# Patient Record
Sex: Female | Born: 1965
Health system: Southern US, Community
[De-identification: ages and names within clinical notes are randomized; demographics above are authoritative.]

## PROBLEM LIST (undated history)

## (undated) DIAGNOSIS — N943 Premenstrual tension syndrome: Secondary | ICD-10-CM

## (undated) DIAGNOSIS — I10 Essential (primary) hypertension: Secondary | ICD-10-CM

## (undated) DIAGNOSIS — E7800 Pure hypercholesterolemia, unspecified: Secondary | ICD-10-CM

## (undated) DIAGNOSIS — F32A Depression, unspecified: Secondary | ICD-10-CM

## (undated) DIAGNOSIS — F329 Major depressive disorder, single episode, unspecified: Secondary | ICD-10-CM

## (undated) DIAGNOSIS — D249 Benign neoplasm of unspecified breast: Secondary | ICD-10-CM

## (undated) DIAGNOSIS — F419 Anxiety disorder, unspecified: Secondary | ICD-10-CM

## (undated) DIAGNOSIS — G43909 Migraine, unspecified, not intractable, without status migrainosus: Secondary | ICD-10-CM

## (undated) DIAGNOSIS — N200 Calculus of kidney: Secondary | ICD-10-CM

## (undated) DIAGNOSIS — R011 Cardiac murmur, unspecified: Secondary | ICD-10-CM

## (undated) HISTORY — PX: ADENOIDECTOMY: SUR15

## (undated) HISTORY — PX: TONSILLECTOMY: SUR1361

## (undated) HISTORY — DX: Depression, unspecified: F32.A

## (undated) HISTORY — DX: Pure hypercholesterolemia, unspecified: E78.00

## (undated) HISTORY — DX: Essential (primary) hypertension: I10

## (undated) HISTORY — DX: Calculus of kidney: N20.0

## (undated) HISTORY — DX: Benign neoplasm of unspecified breast: D24.9

## (undated) HISTORY — DX: Migraine, unspecified, not intractable, without status migrainosus: G43.909

## (undated) HISTORY — DX: Cardiac murmur, unspecified: R01.1

## (undated) HISTORY — DX: Major depressive disorder, single episode, unspecified: F32.9

## (undated) HISTORY — DX: Anxiety disorder, unspecified: F41.9

## (undated) HISTORY — DX: Premenstrual tension syndrome: N94.3

---

## 1998-02-17 ENCOUNTER — Encounter: Admission: RE | Admit: 1998-02-17 | Discharge: 1998-05-18 | Payer: Self-pay | Admitting: Gynecology

## 1998-08-17 ENCOUNTER — Ambulatory Visit (HOSPITAL_COMMUNITY): Admission: RE | Admit: 1998-08-17 | Discharge: 1998-08-17 | Payer: Self-pay | Admitting: Obstetrics and Gynecology

## 1998-08-17 ENCOUNTER — Encounter: Payer: Self-pay | Admitting: Gynecology

## 1998-09-22 ENCOUNTER — Inpatient Hospital Stay (HOSPITAL_COMMUNITY): Admission: AD | Admit: 1998-09-22 | Discharge: 1998-09-25 | Payer: Self-pay | Admitting: Gynecology

## 1998-09-22 ENCOUNTER — Inpatient Hospital Stay (HOSPITAL_COMMUNITY): Admission: AD | Admit: 1998-09-22 | Discharge: 1998-09-22 | Payer: Self-pay | Admitting: Obstetrics and Gynecology

## 1998-10-27 ENCOUNTER — Other Ambulatory Visit: Admission: RE | Admit: 1998-10-27 | Discharge: 1998-10-27 | Payer: Self-pay | Admitting: Obstetrics and Gynecology

## 1998-12-14 ENCOUNTER — Ambulatory Visit (HOSPITAL_COMMUNITY): Admission: RE | Admit: 1998-12-14 | Discharge: 1998-12-14 | Payer: Self-pay | Admitting: Obstetrics and Gynecology

## 1998-12-14 ENCOUNTER — Encounter: Payer: Self-pay | Admitting: Obstetrics and Gynecology

## 1999-03-14 ENCOUNTER — Encounter: Payer: Self-pay | Admitting: Obstetrics and Gynecology

## 1999-03-14 ENCOUNTER — Ambulatory Visit (HOSPITAL_COMMUNITY): Admission: RE | Admit: 1999-03-14 | Discharge: 1999-03-14 | Payer: Self-pay | Admitting: Obstetrics and Gynecology

## 1999-11-01 ENCOUNTER — Other Ambulatory Visit: Admission: RE | Admit: 1999-11-01 | Discharge: 1999-11-01 | Payer: Self-pay | Admitting: Gynecology

## 1999-12-13 ENCOUNTER — Encounter: Admission: RE | Admit: 1999-12-13 | Discharge: 1999-12-13 | Payer: Self-pay | Admitting: Gynecology

## 1999-12-13 ENCOUNTER — Encounter: Payer: Self-pay | Admitting: Gynecology

## 2000-06-07 ENCOUNTER — Other Ambulatory Visit: Admission: RE | Admit: 2000-06-07 | Discharge: 2000-06-07 | Payer: Self-pay | Admitting: Gynecology

## 2000-11-01 ENCOUNTER — Other Ambulatory Visit: Admission: RE | Admit: 2000-11-01 | Discharge: 2000-11-01 | Payer: Self-pay | Admitting: Gynecology

## 2001-11-05 ENCOUNTER — Other Ambulatory Visit: Admission: RE | Admit: 2001-11-05 | Discharge: 2001-11-05 | Payer: Self-pay | Admitting: Gynecology

## 2002-11-06 ENCOUNTER — Other Ambulatory Visit: Admission: RE | Admit: 2002-11-06 | Discharge: 2002-11-06 | Payer: Self-pay | Admitting: Gynecology

## 2004-02-18 ENCOUNTER — Other Ambulatory Visit: Admission: RE | Admit: 2004-02-18 | Discharge: 2004-02-18 | Payer: Self-pay | Admitting: Gynecology

## 2004-03-24 ENCOUNTER — Encounter: Admission: RE | Admit: 2004-03-24 | Discharge: 2004-03-24 | Payer: Self-pay | Admitting: Obstetrics and Gynecology

## 2005-02-20 ENCOUNTER — Other Ambulatory Visit: Admission: RE | Admit: 2005-02-20 | Discharge: 2005-02-20 | Payer: Self-pay | Admitting: Obstetrics and Gynecology

## 2006-02-28 ENCOUNTER — Other Ambulatory Visit: Admission: RE | Admit: 2006-02-28 | Discharge: 2006-02-28 | Payer: Self-pay | Admitting: Gynecology

## 2007-03-13 ENCOUNTER — Other Ambulatory Visit: Admission: RE | Admit: 2007-03-13 | Discharge: 2007-03-13 | Payer: Self-pay | Admitting: Obstetrics and Gynecology

## 2008-02-25 ENCOUNTER — Encounter: Admission: RE | Admit: 2008-02-25 | Discharge: 2008-02-25 | Payer: Self-pay | Admitting: Obstetrics and Gynecology

## 2008-02-28 ENCOUNTER — Encounter: Admission: RE | Admit: 2008-02-28 | Discharge: 2008-02-28 | Payer: Self-pay | Admitting: Obstetrics and Gynecology

## 2008-03-13 ENCOUNTER — Other Ambulatory Visit: Admission: RE | Admit: 2008-03-13 | Discharge: 2008-03-13 | Payer: Self-pay | Admitting: Obstetrics and Gynecology

## 2009-03-17 ENCOUNTER — Encounter: Payer: Self-pay | Admitting: Women's Health

## 2009-03-17 ENCOUNTER — Ambulatory Visit: Payer: Self-pay | Admitting: Women's Health

## 2009-03-17 ENCOUNTER — Other Ambulatory Visit: Admission: RE | Admit: 2009-03-17 | Discharge: 2009-03-17 | Payer: Self-pay | Admitting: Obstetrics and Gynecology

## 2009-04-06 ENCOUNTER — Encounter: Admission: RE | Admit: 2009-04-06 | Discharge: 2009-04-06 | Payer: Self-pay | Admitting: Obstetrics and Gynecology

## 2010-03-18 ENCOUNTER — Ambulatory Visit: Payer: Self-pay | Admitting: Women's Health

## 2010-03-18 ENCOUNTER — Other Ambulatory Visit: Admission: RE | Admit: 2010-03-18 | Discharge: 2010-03-18 | Payer: Self-pay | Admitting: Obstetrics and Gynecology

## 2010-05-11 ENCOUNTER — Encounter: Admission: RE | Admit: 2010-05-11 | Discharge: 2010-05-11 | Payer: Self-pay | Admitting: Obstetrics and Gynecology

## 2011-02-09 ENCOUNTER — Encounter: Payer: Self-pay | Admitting: *Deleted

## 2011-03-15 ENCOUNTER — Other Ambulatory Visit: Payer: Self-pay | Admitting: *Deleted

## 2011-03-15 MED ORDER — DESOGESTREL-ETHINYL ESTRADIOL 0.15-0.02/0.01 MG (21/5) PO TABS: 1.0000 | ORAL_TABLET | Freq: Every day | ORAL | Status: DC

## 2011-03-30 ENCOUNTER — Ambulatory Visit (INDEPENDENT_AMBULATORY_CARE_PROVIDER_SITE_OTHER): Payer: PRIVATE HEALTH INSURANCE | Admitting: Women's Health

## 2011-03-30 ENCOUNTER — Encounter: Payer: Self-pay | Admitting: Women's Health

## 2011-03-30 ENCOUNTER — Other Ambulatory Visit (HOSPITAL_COMMUNITY)
Admission: RE | Admit: 2011-03-30 | Discharge: 2011-03-30 | Disposition: A | Discharge status: Home / Self Care | Payer: PRIVATE HEALTH INSURANCE | Source: Ambulatory Visit | Attending: Gynecology | Admitting: Gynecology

## 2011-03-30 DIAGNOSIS — F419 Anxiety disorder, unspecified: Secondary | ICD-10-CM | POA: Insufficient documentation

## 2011-03-30 DIAGNOSIS — F411 Generalized anxiety disorder: Secondary | ICD-10-CM

## 2011-03-30 DIAGNOSIS — IMO0001 Reserved for inherently not codable concepts without codable children: Secondary | ICD-10-CM

## 2011-03-30 DIAGNOSIS — R82998 Other abnormal findings in urine: Secondary | ICD-10-CM

## 2011-03-30 DIAGNOSIS — Z13220 Encounter for screening for lipoid disorders: Secondary | ICD-10-CM

## 2011-03-30 DIAGNOSIS — Z309 Encounter for contraceptive management, unspecified: Secondary | ICD-10-CM

## 2011-03-30 DIAGNOSIS — Z01419 Encounter for gynecological examination (general) (routine) without abnormal findings: Secondary | ICD-10-CM

## 2011-03-30 DIAGNOSIS — F32A Depression, unspecified: Secondary | ICD-10-CM | POA: Insufficient documentation

## 2011-03-30 DIAGNOSIS — F329 Major depressive disorder, single episode, unspecified: Secondary | ICD-10-CM | POA: Insufficient documentation

## 2011-03-30 MED ORDER — ESCITALOPRAM OXALATE 10 MG PO TABS: 10.0000 mg | ORAL_TABLET | Freq: Every day | ORAL | Status: DC

## 2011-03-30 MED ORDER — DESOGESTREL-ETHINYL ESTRADIOL 0.15-0.02/0.01 MG (21/5) PO TABS: 1.0000 | ORAL_TABLET | Freq: Every day | ORAL | Status: DC

## 2011-03-30 NOTE — Progress Notes (Signed)
Alexandria Weber [birth year] [id number]    History:    The patient presents for annual exam.    Past medical history, past surgical history, family history and social history were all reviewed and documented in the EPIC chart.   ROS:  A  ROS was performed and pertinent positives and negatives are included in the history.  Exam:  Filed Vitals:   03/30/11 0840  BP: 128/86    General appearance:  Normal Head/Neck:  Normal, without cervical or supraclavicular adenopathy. Thyroid:  Symmetrical, normal in size, without palpable masses or nodularity. Respiratory  Effort:  Normal  Auscultation:  Clear without wheezing or rhonchi Cardiovascular  Auscultation:  Regular rate, without rubs, murmurs or gallops  Edema/varicosities:  Not grossly evident Abdominal   Soft,nontender, without masses, guarding or rebound.  Liver/spleen:  No organomegaly noted  Hernia:  None appreciated  Occult test:   Skin  Inspection:  Grossly normal  Palpation:  Grossly normal Neurologic/psychiatric  Orientation:  Normal with appropriate conversation.  Mood/affect:  Normal  Genitourinary    Breasts: Examined lying and sitting.     Right: Without masses, retractions, discharge or axillary adenopathy.     Left: Without masses, retractions, discharge or axillary adenopathy.   Inguinal/mons:  Normal without inguinal adenopathy  External genitalia:  Normal  BUS/Urethra/Skene's glands:  Normal  Bladder:  Normal  Vagina:  Normal  Cervix:  Normal  Uterus:  retroverted, normal in size, shape and contour.  Midline and mobile  Adnexa/parametria:     Rt: Without masses or tenderness.   Lt: Without masses or tenderness.  Anus and perineum: Normal  Digital rectal exam: Normal sphincter tone without palpated masses or tenderness  Assessment/Plan:  45 y.o. MWF G3P3  for annual exam.   Contraceptive on Mircette with no bleeding or very light 1 day cycle. Prescription proper use was given and reviewed, reviewed  risks of blood clots and strokes. SBEs, yearly mammogram which have been normal. Encouraged increasing exercise, fiber rich foods greater than 20 g, fish oil supplement daily to decrease her triglycerides. Did review importance of weight loss for helping with that as well. CBC, lipid profile, UA, Pap today. She has had problems with anxiety in the past and is currently on Lexapro 10, doing well with that prescription proper use was given.. She has had counseling in the past declines need for that at this time. She does have a 14yo son Trinna Post with autism but she states he is doing a little better starting to read, unable to write. Oldest son is having a baby -recommended Tdap vaccine.   Harrington Challenger [facility], 9:24 AM 03/30/2011

## 2011-04-03 ENCOUNTER — Telehealth: Payer: Self-pay | Admitting: Women's Health

## 2011-04-03 DIAGNOSIS — R82998 Other abnormal findings in urine: Secondary | ICD-10-CM

## 2011-04-06 NOTE — Telephone Encounter (Signed)
Left message on patient's cell phone to call Sherri for further instructions.

## 2011-04-07 ENCOUNTER — Other Ambulatory Visit: Payer: Self-pay | Admitting: Women's Health

## 2011-04-07 NOTE — Telephone Encounter (Signed)
SENT TO PHARMACY AGAIN DUE TO ESCRIBE REQUEST. NANCY HAD ALREADY AUTHORIZED THIS RX ON 03/30/11 FOR 1 YEAR, BUT SENT IT AGAIN.

## 2011-04-07 NOTE — Telephone Encounter (Signed)
PT. NOTIFIED OF Alexandria Weber'S NOTE BELOW. PT. STATES SHE DOES NOT HAVE ANY URINARY SYMPTOMS. ORDER IN P.C. FOR CLEAN CATCH URINALYSIS.

## 2011-04-07 NOTE — Telephone Encounter (Signed)
Addended by: Venora Maples on: 04/07/2011 02:44 PM   Modules accepted: Orders

## 2011-08-08 DIAGNOSIS — I447 Left bundle-branch block, unspecified: Secondary | ICD-10-CM

## 2011-08-08 HISTORY — DX: Left bundle-branch block, unspecified: I44.7

## 2011-12-22 ENCOUNTER — Other Ambulatory Visit: Payer: Self-pay | Admitting: Obstetrics and Gynecology

## 2011-12-22 DIAGNOSIS — Z1231 Encounter for screening mammogram for malignant neoplasm of breast: Secondary | ICD-10-CM

## 2012-01-15 ENCOUNTER — Ambulatory Visit
Admission: RE | Admit: 2012-01-15 | Discharge: 2012-01-15 | Disposition: A | Discharge status: Home / Self Care | Payer: 59 | Source: Ambulatory Visit | Attending: Obstetrics and Gynecology | Admitting: Obstetrics and Gynecology

## 2012-01-15 DIAGNOSIS — Z1231 Encounter for screening mammogram for malignant neoplasm of breast: Secondary | ICD-10-CM

## 2012-03-01 DIAGNOSIS — I447 Left bundle-branch block, unspecified: Secondary | ICD-10-CM | POA: Insufficient documentation

## 2012-04-04 ENCOUNTER — Ambulatory Visit (INDEPENDENT_AMBULATORY_CARE_PROVIDER_SITE_OTHER): Payer: Managed Care, Other (non HMO) | Admitting: Women's Health

## 2012-04-04 ENCOUNTER — Encounter: Payer: Self-pay | Admitting: Women's Health

## 2012-04-04 VITALS — BP 126/88 | Ht 65.5 in | Wt 158.0 lb

## 2012-04-04 DIAGNOSIS — N926 Irregular menstruation, unspecified: Secondary | ICD-10-CM

## 2012-04-04 DIAGNOSIS — I10 Essential (primary) hypertension: Secondary | ICD-10-CM | POA: Insufficient documentation

## 2012-04-04 DIAGNOSIS — Z01419 Encounter for gynecological examination (general) (routine) without abnormal findings: Secondary | ICD-10-CM

## 2012-04-04 LAB — TSH: TSH: 1.614 u[IU]/mL (ref 0.350–4.500)

## 2012-04-04 NOTE — Progress Notes (Signed)
Alexandria Weber Oct 22, 1965 [id number]    History:    The patient presents for annual exam.  Stopped Mircette last month due to ER visit with chest pain diagnosed with hypertension and mild stenosis of mitral and aortic valve. Has been started on 2 antihypertensive medications and cholesterol medication. Is being seen by a cardiologist at [facility] and medications are being handled by primary care in [place]. Father has history of hypertension and hypercholesterolemia. History of normal Paps and mammograms. History of one day light cycles on Mircette and increased hot flushes in the past year.   Past medical history, past surgical history, family history and social history were all reviewed and documented in the EPIC chart. Works at [employer], has 3 children, Christiane Ha 22 in the [employer], 44 year old son Trinna Post - autistic, 44 yo daughter Dow Adolph doing well.   ROS:  A  ROS was performed and pertinent positives and negatives are included in the history.  Exam:  Filed Vitals:   04/04/12 1552  BP: 126/88    General appearance:  Normal Head/Neck:  Normal, without cervical or supraclavicular adenopathy. Thyroid:  Symmetrical, normal in size, without palpable masses or nodularity. Respiratory  Effort:  Normal  Auscultation:  Clear without wheezing or rhonchi Cardiovascular  Auscultation:  Regular rate, without rubs, murmurs or gallops  Edema/varicosities:  Not grossly evident Abdominal  Soft,nontender, without masses, guarding or rebound.  Liver/spleen:  No organomegaly noted  Hernia:  None appreciated  Skin  Inspection:  Grossly normal  Palpation:  Grossly normal Neurologic/psychiatric  Orientation:  Normal with appropriate conversation.  Mood/affect:  Normal  Genitourinary    Breasts: Examined lying and sitting.     Right: Without masses, retractions, discharge or axillary adenopathy.     Left: Without masses, retractions, discharge or axillary adenopathy.   Inguinal/mons:   Normal without inguinal adenopathy  External genitalia:  Normal  BUS/Urethra/Skene's glands:  Normal  Bladder:  Normal  Vagina:  Normal  Cervix:  Normal  Uterus:   normal in size, shape and contour.  Midline and mobile  Adnexa/parametria:     Rt: Without masses or tenderness.   Lt: Without masses or tenderness.  Anus and perineum: Normal  Digital rectal exam: Normal sphincter tone without palpated masses or tenderness  Assessment/Plan:  46 y.o. M. WF G3 P3  for annual exam recently diagnosed with hypertension, mitral and aortic stenosis with hypercholesterolemia.  Contraception management Newly diagnosed hypertension, mitral, aortic stenosis and hypercholesterolemia  Plan: Contraception reviewed, reviewed best not  to use birth control pills, Mirena IUD information given and reviewed. Reviewed to call office to have placed with a period with Dr Eda Paschal. Reviewed slight risks of infection, perforation hemorrhage. History of very light periods on birth control pills in the past. Instructed to call if no menstrual cycle. Will check a TSH and FSHprolactin. No Pap history of normal Paps new guidelines reviewed. Condoms if become sexually active. SBE's, continue annual mammogram, gradually increase exercise, calcium rich diet, vitamin D 1000 daily encouraged. Discussed having a cardiologist manage hypertension, meds and followup tests, will check in network cardiologists and schedule.   Harrington Challenger [facility], 4:46 PM 04/04/2012

## 2012-04-04 NOTE — Patient Instructions (Addendum)

## 2012-04-05 LAB — FOLLICLE STIMULATING HORMONE: FSH: 58.6 m[IU]/mL

## 2012-04-08 ENCOUNTER — Other Ambulatory Visit: Payer: Self-pay | Admitting: Women's Health

## 2014-03-12 ENCOUNTER — Other Ambulatory Visit: Payer: Self-pay

## 2014-03-12 DIAGNOSIS — Z1231 Encounter for screening mammogram for malignant neoplasm of breast: Secondary | ICD-10-CM

## 2014-03-20 ENCOUNTER — Encounter: Payer: Self-pay | Admitting: Women's Health

## 2014-03-20 ENCOUNTER — Ambulatory Visit
Admission: RE | Admit: 2014-03-20 | Discharge: 2014-03-20 | Disposition: A | Discharge status: Home / Self Care | Payer: 59 | Source: Ambulatory Visit

## 2014-03-20 ENCOUNTER — Ambulatory Visit (INDEPENDENT_AMBULATORY_CARE_PROVIDER_SITE_OTHER): Payer: 59 | Admitting: Women's Health

## 2014-03-20 ENCOUNTER — Other Ambulatory Visit (HOSPITAL_COMMUNITY)
Admission: RE | Admit: 2014-03-20 | Discharge: 2014-03-20 | Disposition: A | Discharge status: Home / Self Care | Payer: 59 | Source: Ambulatory Visit | Attending: Gynecology | Admitting: Gynecology

## 2014-03-20 VITALS — BP 112/72 | Ht 65.0 in | Wt 156.0 lb

## 2014-03-20 DIAGNOSIS — Z01419 Encounter for gynecological examination (general) (routine) without abnormal findings: Secondary | ICD-10-CM | POA: Diagnosis present

## 2014-03-20 DIAGNOSIS — Z1231 Encounter for screening mammogram for malignant neoplasm of breast: Secondary | ICD-10-CM

## 2014-03-20 DIAGNOSIS — I050 Rheumatic mitral stenosis: Secondary | ICD-10-CM

## 2014-03-20 HISTORY — DX: Rheumatic mitral stenosis: I05.0

## 2014-03-20 NOTE — Patient Instructions (Signed)

## 2014-03-20 NOTE — Progress Notes (Signed)
Alexandria Weber 46-Mar-1967 [id number]    History:    Presents for annual exam.  Monthly cycles/condoms. Normal Pap and mammograms. Hypertension/anxiety/depression and stenosis of mitral valve, has seen a cardiologist.   Past medical history, past surgical history, family history and social history were all reviewed and documented in the EPIC chart. Desk job. Children 23, Alexandria Weber 16 autistic, and Alexandria Weber 14. Father hypertension/hypercholesterolemia. Mother hypertension and stroke.  ROS:  A  12 point ROS was performed and pertinent positives and negatives are included.  Exam:  Filed Vitals:   03/20/14 0814  BP: 112/72    General appearance:  Normal Thyroid:  Symmetrical, normal in size, without palpable masses or nodularity. Respiratory  Auscultation:  Clear without wheezing or rhonchi Cardiovascular  Auscultation:  Regular rate, without rubs, murmurs or gallops  Edema/varicosities:  Not grossly evident Abdominal  Soft,nontender, without masses, guarding or rebound.  Liver/spleen:  No organomegaly noted  Hernia:  None appreciated  Skin  Inspection:  Grossly normal   Breasts: Examined lying and sitting.     Right: Without masses, retractions, discharge or axillary adenopathy.     Left: Without masses, retractions, discharge or axillary adenopathy. Gentitourinary   Inguinal/mons:  Normal without inguinal adenopathy  External genitalia:  Normal  BUS/Urethra/Skene's glands:  Normal  Vagina:  Normal  Cervix:  Normal  Uterus:   normal in size, shape and contour.  Midline and mobile  Adnexa/parametria:     Rt: Without masses or tenderness.   Lt: Without masses or tenderness.  Anus and perineum: Normal  Digital rectal exam: Normal sphincter tone without palpated masses or tenderness  Assessment/Plan:  48 y.o. MWF G3P3 for annual exam.    Normal GYN exam/condoms Hypertension/anxiety/depression - primary care managing labs and meds Stenosis of mitral valve- cardiologist at  [place]: SBE's, overdue for mammogram, has scheduled instructed to keep scheduled appointment. Increase regular exercise, calcium rich diet, vitamin D 1000 daily encouraged. UA, Pap, Pap normal 2012, new screening guidelines reviewed.  Note: This dictation was prepared with Dragon/digital dictation.  Any transcriptional errors that result are unintentional. Alexandria Weber [facility], 9:35 AM 03/20/2014

## 2014-03-21 LAB — URINALYSIS W MICROSCOPIC + REFLEX CULTURE
Casts: NONE SEEN
Crystals: NONE SEEN
Glucose, UA: NEGATIVE mg/dL
Ketones, ur: NEGATIVE mg/dL
Leukocytes, UA: NEGATIVE
Nitrite: NEGATIVE
Protein, ur: 30 mg/dL — AB
Specific Gravity, Urine: 1.023 (ref 1.005–1.030)
Urobilinogen, UA: 1 mg/dL (ref 0.0–1.0)
pH: 6.5 (ref 5.0–8.0)

## 2014-03-22 LAB — URINE CULTURE: Colony Count: >=100000

## 2014-03-23 LAB — CYTOLOGY - PAP

## 2014-06-08 ENCOUNTER — Encounter: Payer: Self-pay | Admitting: Women's Health

## 2015-03-18 ENCOUNTER — Other Ambulatory Visit: Payer: Self-pay

## 2015-03-18 DIAGNOSIS — Z1231 Encounter for screening mammogram for malignant neoplasm of breast: Secondary | ICD-10-CM

## 2015-03-25 ENCOUNTER — Ambulatory Visit
Admission: RE | Admit: 2015-03-25 | Discharge: 2015-03-25 | Disposition: A | Discharge status: Home / Self Care | Payer: 59 | Source: Ambulatory Visit

## 2015-03-25 DIAGNOSIS — Z1231 Encounter for screening mammogram for malignant neoplasm of breast: Secondary | ICD-10-CM

## 2015-04-01 ENCOUNTER — Encounter: Payer: Self-pay | Admitting: Women's Health

## 2015-07-27 ENCOUNTER — Encounter: Payer: Self-pay | Admitting: Family Medicine

## 2015-07-27 ENCOUNTER — Ambulatory Visit (INDEPENDENT_AMBULATORY_CARE_PROVIDER_SITE_OTHER): Payer: 59 | Admitting: Family Medicine

## 2015-07-27 VITALS — BP 109/75 | HR 60 | Temp 97.7°F | Resp 20 | Ht 65.0 in | Wt 154.0 lb

## 2015-07-27 DIAGNOSIS — Z0000 Encounter for general adult medical examination without abnormal findings: Secondary | ICD-10-CM | POA: Diagnosis not present

## 2015-07-27 DIAGNOSIS — F419 Anxiety disorder, unspecified: Secondary | ICD-10-CM | POA: Diagnosis not present

## 2015-07-27 DIAGNOSIS — Z23 Encounter for immunization: Secondary | ICD-10-CM

## 2015-07-27 DIAGNOSIS — M436 Torticollis: Secondary | ICD-10-CM

## 2015-07-27 DIAGNOSIS — I10 Essential (primary) hypertension: Secondary | ICD-10-CM

## 2015-07-27 DIAGNOSIS — Z7189 Other specified counseling: Secondary | ICD-10-CM

## 2015-07-27 DIAGNOSIS — Z7689 Persons encountering health services in other specified circumstances: Secondary | ICD-10-CM | POA: Insufficient documentation

## 2015-07-27 HISTORY — DX: Torticollis: M43.6

## 2015-07-27 LAB — CBC WITH DIFFERENTIAL/PLATELET
Basophils Absolute: 0 10*3/uL (ref 0.0–0.1)
Basophils Relative: 0.7 % (ref 0.0–3.0)
Eosinophils Absolute: 0.3 10*3/uL (ref 0.0–0.7)
Eosinophils Relative: 6.8 % — ABNORMAL HIGH (ref 0.0–5.0)
HCT: 37.7 % (ref 36.0–46.0)
Hemoglobin: 12.2 g/dL (ref 12.0–15.0)
Lymphocytes Relative: 39.2 % (ref 12.0–46.0)
Lymphs Abs: 1.7 10*3/uL (ref 0.7–4.0)
MCHC: 32.4 g/dL (ref 30.0–36.0)
MCV: 86.2 fl (ref 78.0–100.0)
Monocytes Absolute: 0.3 10*3/uL (ref 0.1–1.0)
Monocytes Relative: 6 % (ref 3.0–12.0)
Neutro Abs: 2 10*3/uL (ref 1.4–7.7)
Neutrophils Relative %: 47.3 % (ref 43.0–77.0)
Platelets: 389 10*3/uL (ref 150.0–400.0)
RBC: 4.38 Mil/uL (ref 3.87–5.11)
RDW: 14.6 % (ref 11.5–15.5)
WBC: 4.3 10*3/uL (ref 4.0–10.5)

## 2015-07-27 LAB — COMPREHENSIVE METABOLIC PANEL
ALT: 14 U/L (ref 0–35)
AST: 21 U/L (ref 0–37)
Albumin: 4.4 g/dL (ref 3.5–5.2)
Alkaline Phosphatase: 95 U/L (ref 39–117)
BUN: 14 mg/dL (ref 6–23)
CO2: 32 mEq/L (ref 19–32)
Calcium: 8.9 mg/dL (ref 8.4–10.5)
Chloride: 103 mEq/L (ref 96–112)
Creatinine, Ser: 0.67 mg/dL (ref 0.40–1.20)
GFR: 100.07 mL/min (ref >60.00)
Glucose, Bld: 87 mg/dL (ref 70–99)
Potassium: 4.2 mEq/L (ref 3.5–5.1)
Sodium: 141 mEq/L (ref 135–145)
Total Bilirubin: 0.5 mg/dL (ref 0.2–1.2)
Total Protein: 7.1 g/dL (ref 6.0–8.3)

## 2015-07-27 LAB — LIPID PANEL
Cholesterol: 215 mg/dL — ABNORMAL HIGH (ref 0–200)
HDL: 64.7 mg/dL (ref >39.00)
LDL Cholesterol: 132 mg/dL — ABNORMAL HIGH (ref 0–99)
NonHDL: 150.27
Total CHOL/HDL Ratio: 3
Triglycerides: 91 mg/dL (ref 0.0–149.0)
VLDL: 18.2 mg/dL (ref 0.0–40.0)

## 2015-07-27 LAB — TSH: TSH: 1.29 u[IU]/mL (ref 0.35–4.50)

## 2015-07-27 LAB — HEMOGLOBIN A1C: Hgb A1c MFr Bld: 5.6 % (ref 4.6–6.5)

## 2015-07-27 MED ORDER — ESCITALOPRAM OXALATE 20 MG PO TABS: 20.0000 mg | ORAL_TABLET | Freq: Every day | ORAL | Status: DC

## 2015-07-27 MED ORDER — AMLODIPINE BESYLATE 5 MG PO TABS: 5.0000 mg | ORAL_TABLET | Freq: Every day | ORAL | Status: DC

## 2015-07-27 NOTE — Progress Notes (Signed)
Subjective:    Patient ID: Alexandria Weber, female    DOB: [birth year], 49 y.o.   MRN: [id number]  HPI  Patient presents for new patient establishment. All past medical history, surgical history, allergies, family history, immunizations and social history was obtained from the patient today and entered into the electronic medical record. Records are requested from her prior PCP, and will be reviewed at the time they are received. All medical records will be updated at that time.  Hypertension: Patient states she has been on blood pressure medications for approximate 4 years. She reports she was diagnosed after hypertensive crisis. She was started on a medication, that she does not recall, and it gave her headaches. At that time she switched to amlodipine, and has tolerated it well. Currently she's on amlodipine 5 mg daily, without side effects and well-controlled blood pressures. Patient does not watch the salt content in her diet or exercise daily. She denies any chest pain, shortness of breath or lower extremity edema. She does admit to occasional dizziness, that she does not feel is associated with her blood pressure. She does monitor her blood pressure at home and she states it's normally around 120/70.   Anxiety: Patient states she has been on Lexapro for approximately 4 years, last year her dose was increased from 2 mg to 20 mg Lexapro daily. Patient takes this medication in the morning. She reports good compliance with the medication. She denies any negative side effects with medication. She feels her condition is stable on this dose of medication. She was tried on Zoloft many years ago, that was discontinued not necessarily to side effects to her condition improved.   Neck/shoulder pain: Patient states that her prior PCP and chiropractor or treating her neck/shoulder pain. She was diagnosed with left torticollis, and treated through chiropractic manipulation and muscle relaxers. She has never  seen physical therapy. She does not recall the muscle relaxers that were tried, but states that she never felt that they were effective. At one time she did have her referral to discuss Botox injections, but states that she did not follow-up with that referral secondary to fear. She has not seen her provider or her chiropractor for the last year. She feels that her condition is progressing.  Health maintenance:  Colonoscopy:No fhx colon cancer/polyps, routine screen age 64 Mammogram: UTD 2016, Birads31. No family history continue one to two-year screening. Cervical cancer screening:UTD 2015 (no co-testing), Dr. Elon Alas,  Immunizations: Flu shot given today, Tdap unknown (waiting until after the holidays)  Infectious disease screening: HIV completed with blood donation and pregnancy   Past Medical History  Diagnosis Date  . Migraine headache   . PMS (premenstrual syndrome)   . Fibroadenoma of breast     H/O IN LEFT BREAST NO CHANGE PER [place]  . Anxiety   . Depression   . Hypertension   . High cholesterol   . Heart murmur    Allergies  Allergen Reactions  . Penicillins Hives  . Sulfa Antibiotics Hives   Past Surgical History  Procedure Laterality Date  . Cesarean section      x2  . Tonsillectomy     Family History  Problem Relation Age of Onset  . Heart disease Mother   . Hearing loss Mother   . Heart disease Father   . Hypertension Father   . Stroke Father   . Arthritis Father   . Hypertension Maternal Grandfather   . Hypertension Paternal Grandfather   . Heart  disease Paternal Grandfather   . Drug abuse Brother     committed suicide  . Diabetes Mellitus II Maternal Aunt   . Diabetes Mellitus II Paternal Aunt   . Heart disease Paternal Uncle   . Colon cancer Neg Hx   . Breast cancer Neg Hx    Social History   Social History  . Marital Status: Married    Spouse Name: N/A  . Number of Children: N/A  . Years of Education: N/A   Occupational History  . Not on  file.   Social History Main Topics  . Smoking status: Never Smoker   . Smokeless tobacco: Never Used  . Alcohol Use: No  . Drug Use: No  . Sexual Activity: Yes    Birth Control/ Protection: None   Other Topics Concern  . Not on file   Social History Narrative   Married. Spouse is name Suezanne Jacquet. 3 children.   High school graduate. Full-time employed. 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   Drinks caffeinated beverages, no tobacco, no alcohol, no recreational drugs.   Wears her seatbelt. Smoke detectors located in the home. Firearms located in the home in a locked case.   Feels safe in her relationship.     Medication List       This list is accurate as of: 07/27/15 12:13 PM.  Always use your most recent med list.               amLODipine 5 MG tablet  Commonly known as:  NORVASC  Take 1 tablet (5 mg total) by mouth daily.     escitalopram 20 MG tablet  Commonly known as:  LEXAPRO  Take 1 tablet (20 mg total) by mouth daily.          Review of Systems Negative, with the exception of above mentioned in HPI      Objective:   Physical Exam BP 109/75 mmHg  Pulse 60  Temp(Src) 97.7 F (36.5 C) (Oral)  Resp 20  Ht 5' 5"  (1.651 m)  Wt 154 lb (69.854 kg)  BMI 25.63 kg/m2  SpO2 99%  LMP 06/23/2015 Gen: Afebrile. No acute distress. Nontoxic in appearance, well-developed, well-nourished, Caucasian female. HENT: AT. [place]. Bilateral TM visualized and normal in appearance. MMM, no oral lesions Bilateral nares without erythema or swelling. Throat without erythema or exudates. No cough on exam, no hoarseness on exam. Good dentition. Eyes:Pupils Equal Round Reactive to light, Extraocular movements intact,  Conjunctiva without redness, discharge or icterus. Neck/lymp/endocrine: Supple, no lymphadenopathy, no thyromegaly CV: RRR 1/6 systolic murmur appreciated, no clicks, no gallops, no rubs, no edema, +2/4 P posterior tibialis pulses Chest: CTAB, no wheeze or crackles Abd: Soft. Flat.  NTND. BS present. No Masses palpated. No rebound tenderness or guarding. No hepatosplenomegaly.  MSK: No erythema,  Skin: No rashes, purpura or petechiae.  Neuro: Normal gait. PERLA. EOMi. Alert. Oriented x3Cranial nerves II through XII intact. Muscle strength 5/5 upper and lower extremity. DTRs equal bilaterally. Psych: Normal affect, dress and demeanor. Normal speech. Normal thought content and judgment..      Assessment & Plan:  1. Need for prophylactic vaccination and inoculation against influenza - Flu Vaccine QUAD 36+ mos PF IM (Fluarix & Fluzone Quad PF)  2. Essential hypertension - Stable - Patient is on low-salt diet, exercise at least 150 minutes a week. She reports compliance with her amlodipine 5 mg, refills provided today and 90 day refills - amLODipine (NORVASC) 5 MG tablet; Take 1 tablet (5  mg total) by mouth daily.  Dispense: 90 tablet; Refill: 1 - CBC w/Diff - Comp Met (CMET) - Lipid panel - TSH - HgB A1c  3. Anxiety - Stable. Refills on Lexapro 20 mg daily provided today. - escitalopram (LEXAPRO) 20 MG tablet; Take 1 tablet (20 mg total) by mouth daily.  Dispense: 90 tablet; Refill: 1  4. Encounter for preventive health examination/establish care Colonoscopy:No fhx colon cancer/polyps, routine screen age 16 Mammogram: UTD 2016, Birads60. No family history continue one to two-year screening. Cervical cancer screening:UTD 2015 (no co-testing), Dr. Elon Alas,  Immunizations: Flu shot given today, Tdap unknown (waiting until after the holidays)  Infectious disease screening: HIV completed with blood donation and pregnancy  5. Torticollis, acquired - Acquired torticollis by patient. Records have been requested from prior PCP and chiropractors office today. Once records received, will consider plan, consider Botox injections. Will place referral without appointment if patient desires. Consider physical therapy.   6 month follow-up on anxiety

## 2015-07-27 NOTE — Patient Instructions (Addendum)
Health Maintenance, Female Adopting a healthy lifestyle and getting preventive care can go a long way to promote health and wellness. Talk with your health care provider about what schedule of regular examinations is right for you. This is a good chance for you to check in with your provider about disease prevention and staying healthy. In between checkups, there are plenty of things you can do on your own. Experts have done a lot of research about which lifestyle changes and preventive measures are most likely to keep you healthy. Ask your health care provider for more information. WEIGHT AND DIET  Eat a healthy diet  Be sure to include plenty of vegetables, fruits, low-fat dairy products, and lean protein.  Do not eat a lot of foods high in solid fats, added sugars, or salt.  Get regular exercise. This is one of the most important things you can do for your health.  Most adults should exercise for at least 150 minutes each week. The exercise should increase your heart rate and make you sweat (moderate-intensity exercise).  Most adults should also do strengthening exercises at least twice a week. This is in addition to the moderate-intensity exercise.  Maintain a healthy weight  Body mass index (BMI) is a measurement that can be used to identify possible weight problems. It estimates body fat based on height and weight. Your health care provider can help determine your BMI and help you achieve or maintain a healthy weight.  For females 20 years of age and older:   A BMI below 18.5 is considered underweight.  A BMI of 18.5 to 24.9 is normal.  A BMI of 25 to 29.9 is considered overweight.  A BMI of 30 and above is considered obese.  Watch levels of cholesterol and blood lipids  You should start having your blood tested for lipids and cholesterol at 47 years of age, then have this test every 5 years.  You may need to have your cholesterol levels checked more often if:  Your lipid  or cholesterol levels are high.  You are older than 47 years of age.  You are at high risk for heart disease.  CANCER SCREENING   Lung Cancer  Lung cancer screening is recommended for adults 55-80 years old who are at high risk for lung cancer because of a history of smoking.  A yearly low-dose CT scan of the lungs is recommended for people who:  Currently smoke.  Have quit within the past 15 years.  Have at least a 30-pack-year history of smoking. A pack year is smoking an average of one pack of cigarettes a day for 1 year.  Yearly screening should continue until it has been 15 years since you quit.  Yearly screening should stop if you develop a health problem that would prevent you from having lung cancer treatment.  Breast Cancer  Practice breast self-awareness. This means understanding how your breasts normally appear and feel.  It also means doing regular breast self-exams. Let your health care provider know about any changes, no matter how small.  If you are in your 20s or 30s, you should have a clinical breast exam (CBE) by a health care provider every 1-3 years as part of a regular health exam.  If you are 40 or older, have a CBE every year. Also consider having a breast X-ray (mammogram) every year.  If you have a family history of breast cancer, talk to your health care provider about genetic screening.  If you   are at high risk for breast cancer, talk to your health care provider about having an MRI and a mammogram every year.  Breast cancer gene (BRCA) assessment is recommended for women who have family members with BRCA-related cancers. BRCA-related cancers include:  Breast.  Ovarian.  Tubal.  Peritoneal cancers.  Results of the assessment will determine the need for genetic counseling and BRCA1 and BRCA2 testing. Cervical Cancer Your health care provider may recommend that you be screened regularly for cancer of the pelvic organs (ovaries, uterus, and  vagina). This screening involves a pelvic examination, including checking for microscopic changes to the surface of your cervix (Pap test). You may be encouraged to have this screening done every 3 years, beginning at age 21.  For women ages 30-65, health care providers may recommend pelvic exams and Pap testing every 3 years, or they may recommend the Pap and pelvic exam, combined with testing for human papilloma virus (HPV), every 5 years. Some types of HPV increase your risk of cervical cancer. Testing for HPV may also be done on women of any age with unclear Pap test results.  Other health care providers may not recommend any screening for nonpregnant women who are considered low risk for pelvic cancer and who do not have symptoms. Ask your health care provider if a screening pelvic exam is right for you.  If you have had past treatment for cervical cancer or a condition that could lead to cancer, you need Pap tests and screening for cancer for at least 20 years after your treatment. If Pap tests have been discontinued, your risk factors (such as having a new sexual partner) need to be reassessed to determine if screening should resume. Some women have medical problems that increase the chance of getting cervical cancer. In these cases, your health care provider may recommend more frequent screening and Pap tests. Colorectal Cancer  This type of cancer can be detected and often prevented.  Routine colorectal cancer screening usually begins at 47 years of age and continues through 47 years of age.  Your health care provider may recommend screening at an earlier age if you have risk factors for colon cancer.  Your health care provider may also recommend using home test kits to check for hidden blood in the stool.  A small camera at the end of a tube can be used to examine your colon directly (sigmoidoscopy or colonoscopy). This is done to check for the earliest forms of colorectal  cancer.  Routine screening usually begins at age 50.  Direct examination of the colon should be repeated every 5-10 years through 47 years of age. However, you may need to be screened more often if early forms of precancerous polyps or small growths are found. Skin Cancer  Check your skin from head to toe regularly.  Tell your health care provider about any new moles or changes in moles, especially if there is a change in a mole's shape or color.  Also tell your health care provider if you have a mole that is larger than the size of a pencil eraser.  Always use sunscreen. Apply sunscreen liberally and repeatedly throughout the day.  Protect yourself by wearing long sleeves, pants, a wide-brimmed hat, and sunglasses whenever you are outside. HEART DISEASE, DIABETES, AND HIGH BLOOD PRESSURE   High blood pressure causes heart disease and increases the risk of stroke. High blood pressure is more likely to develop in:  People who have blood pressure in the high end   of the normal range (130-139/85-89 mm Hg).  People who are overweight or obese.  People who are African American.  If you are 38-23 years of age, have your blood pressure checked every 3-5 years. If you are 61 years of age or older, have your blood pressure checked every year. You should have your blood pressure measured twice--once when you are at a hospital or clinic, and once when you are not at a hospital or clinic. Record the average of the two measurements. To check your blood pressure when you are not at a hospital or clinic, you can use:  An automated blood pressure machine at a pharmacy.  A home blood pressure monitor.  If you are between 45 years and 39 years old, ask your health care provider if you should take aspirin to prevent strokes.  Have regular diabetes screenings. This involves taking a blood sample to check your fasting blood sugar level.  If you are at a normal weight and have a low risk for diabetes,  have this test once every three years after 47 years of age.  If you are overweight and have a high risk for diabetes, consider being tested at a younger age or more often. PREVENTING INFECTION  Hepatitis B  If you have a higher risk for hepatitis B, you should be screened for this virus. You are considered at high risk for hepatitis B if:  You were born in a country where hepatitis B is common. Ask your health care provider which countries are considered high risk.  Your parents were born in a high-risk country, and you have not been immunized against hepatitis B (hepatitis B vaccine).  You have HIV or AIDS.  You use needles to inject street drugs.  You live with someone who has hepatitis B.  You have had sex with someone who has hepatitis B.  You get hemodialysis treatment.  You take certain medicines for conditions, including cancer, organ transplantation, and autoimmune conditions. Hepatitis C  Blood testing is recommended for:  Everyone born from [birth year] through 1963.  Anyone with known risk factors for hepatitis C. Sexually transmitted infections (STIs)  You should be screened for sexually transmitted infections (STIs) including gonorrhea and chlamydia if:  You are sexually active and are younger than 47 years of age.  You are older than 47 years of age and your health care provider tells you that you are at risk for this type of infection.  Your sexual activity has changed since you were last screened and you are at an increased risk for chlamydia or gonorrhea. Ask your health care provider if you are at risk.  If you do not have HIV, but are at risk, it may be recommended that you take a prescription medicine daily to prevent HIV infection. This is called pre-exposure prophylaxis (PrEP). You are considered at risk if:  You are sexually active and do not regularly use condoms or know the HIV status of your partner(s).  You take drugs by injection.  You are sexually  active with a partner who has HIV. Talk with your health care provider about whether you are at high risk of being infected with HIV. If you choose to begin PrEP, you should first be tested for HIV. You should then be tested every 3 months for as long as you are taking PrEP.  PREGNANCY   If you are premenopausal and you may become pregnant, ask your health care provider about preconception counseling.  If you may  become pregnant, take 400 to 800 micrograms (mcg) of folic acid every day.  If you want to prevent pregnancy, talk to your health care provider about birth control (contraception). OSTEOPOROSIS AND MENOPAUSE   Osteoporosis is a disease in which the bones lose minerals and strength with aging. This can result in serious bone fractures. Your risk for osteoporosis can be identified using a bone density scan.  If you are 55 years of age or older, or if you are at risk for osteoporosis and fractures, ask your health care provider if you should be screened.  Ask your health care provider whether you should take a calcium or vitamin D supplement to lower your risk for osteoporosis.  Menopause may have certain physical symptoms and risks.  Hormone replacement therapy may reduce some of these symptoms and risks. Talk to your health care provider about whether hormone replacement therapy is right for you.  HOME CARE INSTRUCTIONS   Schedule regular health, dental, and eye exams.  Stay current with your immunizations.   Do not use any tobacco products including cigarettes, chewing tobacco, or electronic cigarettes.  If you are pregnant, do not drink alcohol.  If you are breastfeeding, limit how much and how often you drink alcohol.  Limit alcohol intake to no more than 1 drink per day for nonpregnant women. One drink equals 12 ounces of beer, 5 ounces of wine, or 1 ounces of hard liquor.  Do not use street drugs.  Do not share needles.  Ask your health care provider for help if  you need support or information about quitting drugs.  Tell your health care provider if you often feel depressed.  Tell your health care provider if you have ever been abused or do not feel safe at home.   This information is not intended to replace advice given to you by your health care provider. Make sure you discuss any questions you have with your health care provider.   Document Released: 02/06/2011 Document Revised: 08/14/2014 Document Reviewed: 06/25/2013 Elsevier Interactive Patient Education [employer].  It was a pleasure meeting you today! I hope yo have a great holiday season.  F/U 6 months on hypertension and anxiety. We will call you with your results once available.  Refills prescribed for 6 months

## 2015-07-28 ENCOUNTER — Telehealth: Payer: Self-pay | Admitting: Family Medicine

## 2015-07-28 DIAGNOSIS — E785 Hyperlipidemia, unspecified: Secondary | ICD-10-CM

## 2015-07-28 NOTE — Telephone Encounter (Signed)
Spoke with patient reviewed results and instructions . Patient verbalized understanding. 

## 2015-07-28 NOTE — Telephone Encounter (Signed)
Please call pt:  - Her labs from yesterday are normal. - Her LDL (bad cholesterol) is mildly elevated at 132. Low saturated fat Diet, increase exercise and consider fish oil/krill oil supplement. Ideally, with her history of elevate BP and family history of heart disease, her LDL should be below 100. However her HDL (good cholesterol) is excellent.  - We will recheck in 6 months (fastign lab ~3 days prior to appt), if LDL not improved on above regimen, may discuss prescribed cholesterol lowering medications considering her family history.

## 2015-07-28 NOTE — Telephone Encounter (Signed)
Left message for patient to return call to review labs and instructions. 

## 2015-10-12 ENCOUNTER — Telehealth: Payer: Self-pay | Admitting: Family Medicine

## 2015-10-12 DIAGNOSIS — M436 Torticollis: Secondary | ICD-10-CM

## 2015-10-12 NOTE — Telephone Encounter (Signed)
Patient is requested referral for rehabilitation of her neck. She said Dr. Raoul Pitch recommended it during last OV.

## 2015-10-13 NOTE — Telephone Encounter (Signed)
We did discuss her physical therapy for her torticollis on the last appointment, and I did tell her if she decided she would like to start physical therapy I would refer her without appointment. Therefore I have placed a referral for her today.

## 2015-10-21 NOTE — Telephone Encounter (Signed)
Patient is scheduled at [facility]

## 2016-01-25 ENCOUNTER — Encounter: Payer: Self-pay | Admitting: Family Medicine

## 2016-01-25 ENCOUNTER — Ambulatory Visit (INDEPENDENT_AMBULATORY_CARE_PROVIDER_SITE_OTHER): Payer: 59 | Admitting: Family Medicine

## 2016-01-25 VITALS — BP 117/75 | HR 65 | Temp 98.1°F | Resp 20 | Ht 65.0 in | Wt 151.2 lb

## 2016-01-25 DIAGNOSIS — E785 Hyperlipidemia, unspecified: Secondary | ICD-10-CM | POA: Diagnosis not present

## 2016-01-25 DIAGNOSIS — Z23 Encounter for immunization: Secondary | ICD-10-CM

## 2016-01-25 DIAGNOSIS — F419 Anxiety disorder, unspecified: Secondary | ICD-10-CM

## 2016-01-25 DIAGNOSIS — I10 Essential (primary) hypertension: Secondary | ICD-10-CM

## 2016-01-25 DIAGNOSIS — M436 Torticollis: Secondary | ICD-10-CM

## 2016-01-25 LAB — LIPID PANEL
Cholesterol: 231 mg/dL — ABNORMAL HIGH (ref 0–200)
HDL: 62 mg/dL (ref >39.00)
LDL Cholesterol: 146 mg/dL — ABNORMAL HIGH (ref 0–99)
NonHDL: 169
Total CHOL/HDL Ratio: 4
Triglycerides: 116 mg/dL (ref 0.0–149.0)
VLDL: 23.2 mg/dL (ref 0.0–40.0)

## 2016-01-25 MED ORDER — ESCITALOPRAM OXALATE 20 MG PO TABS: 20.0000 mg | ORAL_TABLET | Freq: Every day | ORAL | Status: DC

## 2016-01-25 MED ORDER — AMLODIPINE BESYLATE 5 MG PO TABS: 5.0000 mg | ORAL_TABLET | Freq: Every day | ORAL | Status: DC

## 2016-01-25 NOTE — Patient Instructions (Signed)
1000 mg fish oil daily.  I refilled your medicines today.  Follow up in in mid- December for preventive.

## 2016-01-25 NOTE — Progress Notes (Signed)
Patient ID: Alexandria Weber, female   DOB: [birth year], 50 y.o.   MRN: [id number]   Subjective:    Patient ID: Alexandria Weber, female    DOB: Mar 21, 1966, 50 y.o.   MRN: [id number]  HPI  Hypertension: patient presents today for follow up on her hypertension. She is doing well with amlodipine 5 mg daily. She denies any negative side effects to medication. She is not as compliant with her diet as she should be. She doe snot routinely exercise. She denies any chest pain, shortness of breath or lower extremity edema.   Anxiety: Patient states she is doing well on Lexapro 20 mg daily. She feels stable on this medication.  Patient takes this medication in the morning. She reports good compliance with the medication. She denies any negative side effects with medication. She does need refills today.   Torticollis: has started botox injections with Dr. Nelva Bush. She is feels she has seen some positive changes with use.   Past Medical History  Diagnosis Date  . Migraine headache   . PMS (premenstrual syndrome)   . Fibroadenoma of breast     H/O IN LEFT BREAST NO CHANGE PER [place]  . Anxiety   . Depression   . Hypertension   . High cholesterol   . Heart murmur    Allergies  Allergen Reactions  . Penicillins Hives  . Sulfa Antibiotics Hives   Past Surgical History  Procedure Laterality Date  . Cesarean section      x2  . Tonsillectomy     Family History  Problem Relation Age of Onset  . Heart disease Mother   . Hearing loss Mother   . Heart disease Father   . Hypertension Father   . Stroke Father   . Arthritis Father   . Hypertension Maternal Grandfather   . Hypertension Paternal Grandfather   . Heart disease Paternal Grandfather   . Drug abuse Brother     committed suicide  . Diabetes Mellitus II Maternal Aunt   . Diabetes Mellitus II Paternal Aunt   . Heart disease Paternal Uncle   . Colon cancer Neg Hx   . Breast cancer Neg Hx    Social History   Social History  . Marital  Status: Married    Spouse Name: N/A  . Number of Children: N/A  . Years of Education: N/A   Occupational History  . Not on file.   Social History Main Topics  . Smoking status: Never Smoker   . Smokeless tobacco: Never Used  . Alcohol Use: No  . Drug Use: No  . Sexual Activity: Yes    Birth Control/ Protection: None   Other Topics Concern  . Not on file   Social History Narrative   Married. Spouse is name Alexandria Weber. 3 children.   High school graduate. Full-time employed. 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   Drinks caffeinated beverages, no tobacco, no alcohol, no recreational drugs.   Wears her seatbelt. Smoke detectors located in the home. Firearms located in the home in a locked case.   Feels safe in her relationship.     Medication List       This list is accurate as of: 01/25/16  8:13 AM.  Always use your most recent med list.               amLODipine 5 MG tablet  Commonly known as:  NORVASC  Take 1 tablet (5 mg total) by mouth daily.     escitalopram 20  MG tablet  Commonly known as:  LEXAPRO  Take 1 tablet (20 mg total) by mouth daily.         Review of Systems Negative, with the exception of above mentioned in HPI      Objective:   Physical Exam BP 117/75 mmHg  Pulse 65  Temp(Src) 98.1 F (36.7 C) (Oral)  Resp 20  Ht 5\' 5"  (1.651 m)  Wt 151 lb 4 oz (68.607 kg)  BMI 25.17 kg/m2  SpO2 95%  LMP 12/25/2015 (Approximate) Gen: Afebrile. No acute distress. Nontoxic in appearance, well-developed, well-nourished, Caucasian female. HENT: AT. [place].  MMM, no oral lesions  Eyes:Pupils Equal Round Reactive to light, Extraocular movements intact,  Conjunctiva without redness, discharge or icterus. CV: RRR 1/6 systolic murmur appreciated, no clicks, no gallops, no rubs, no edema, +2/4 P posterior tibialis pulses Chest: CTAB, no wheeze or crackles  MSK: No erythema, right upper trap hypertrophy, left upper trap with pain over supraspinatus, moderate muscle atrophy.    Neuro: Normal gait. PERLA. EOMi. Alert. Oriented x3 Psych: Normal affect, dress and demeanor. Normal speech. Normal thought content and judgment.      Assessment & Plan:  Essential hypertension - Stable - Patient is to start low-salt diet, exercise at least 150 minutes a week. She reports compliance with her amlodipine 5 mg, refills provided today and 90 day refills - amLODipine (NORVASC) 5 MG tablet; Take 1 tablet (5 mg total) by mouth daily.  Dispense: 90 tablet; Refill: 1 - Lipid panel  Hyperlipidemia:  - Prior h/o hyperlipidemia nad strong FH heart disease.  - Lipid panel today.  - encouraged fish oil 1000 mg daily.   Anxiety - Stable. Refills on Lexapro 20 mg daily provided today. - escitalopram (LEXAPRO) 20 MG tablet; Take 1 tablet (20 mg total) by mouth daily.  Dispense: 90 tablet; Refill: 1  Torticollis, acquired - responding well to botox treatment. May need OMT/trigger injections in the future.    6 month CPE with fasting labs.   Electronically Signed by: Howard Pouch, DO [facility] [place]

## 2016-01-26 ENCOUNTER — Telehealth: Payer: Self-pay | Admitting: Family Medicine

## 2016-01-26 NOTE — Telephone Encounter (Signed)
Please  Call pt: - her cholesterol levels are a little higher than prior collection. - I would recommend she work on diet and exercise, and take 2000 mg of fish oil daily (prior stated 1000 mg). We will retest at her CPE in 6 months. If still not at goal will start on medication.  >150 minutes of exercise Diet higher in fiber/fresh vegetables, low  In saturated fats

## 2016-01-26 NOTE — Telephone Encounter (Signed)
Left message for patient to call back to review results and instructions.

## 2016-01-26 NOTE — Telephone Encounter (Signed)
Spoke with patient reviewed results and instructions . Patient verbalized understanding. 

## 2016-07-24 ENCOUNTER — Other Ambulatory Visit: Payer: Self-pay | Admitting: *Deleted

## 2016-07-24 DIAGNOSIS — I10 Essential (primary) hypertension: Secondary | ICD-10-CM

## 2016-07-24 DIAGNOSIS — F419 Anxiety disorder, unspecified: Secondary | ICD-10-CM

## 2016-07-24 MED ORDER — AMLODIPINE BESYLATE 5 MG PO TABS: 5.0000 mg | ORAL_TABLET | Freq: Every day | ORAL | 0 refills | Status: DC

## 2016-07-24 NOTE — Telephone Encounter (Signed)
Refilled amlodiopine for 30 days needs appt for anymore refills.

## 2016-07-27 ENCOUNTER — Encounter: Payer: 59 | Admitting: Family Medicine

## 2016-08-21 ENCOUNTER — Other Ambulatory Visit: Payer: Self-pay | Admitting: Family Medicine

## 2016-08-21 DIAGNOSIS — Z1231 Encounter for screening mammogram for malignant neoplasm of breast: Secondary | ICD-10-CM

## 2016-08-22 ENCOUNTER — Encounter: Payer: 59 | Admitting: Family Medicine

## 2016-08-31 ENCOUNTER — Other Ambulatory Visit: Payer: Self-pay | Admitting: Family Medicine

## 2016-08-31 DIAGNOSIS — F419 Anxiety disorder, unspecified: Secondary | ICD-10-CM

## 2016-08-31 DIAGNOSIS — I10 Essential (primary) hypertension: Secondary | ICD-10-CM

## 2016-09-05 ENCOUNTER — Encounter: Payer: Self-pay | Admitting: Family Medicine

## 2016-09-05 ENCOUNTER — Ambulatory Visit (INDEPENDENT_AMBULATORY_CARE_PROVIDER_SITE_OTHER): Payer: 59 | Admitting: Family Medicine

## 2016-09-05 ENCOUNTER — Ambulatory Visit
Admission: RE | Admit: 2016-09-05 | Discharge: 2016-09-05 | Disposition: A | Discharge status: Home / Self Care | Payer: 59 | Source: Ambulatory Visit | Attending: Family Medicine | Admitting: Family Medicine

## 2016-09-05 VITALS — BP 105/70 | HR 72 | Temp 98.3°F | Resp 16 | Ht 65.0 in | Wt 152.0 lb

## 2016-09-05 DIAGNOSIS — Z23 Encounter for immunization: Secondary | ICD-10-CM

## 2016-09-05 DIAGNOSIS — E785 Hyperlipidemia, unspecified: Secondary | ICD-10-CM | POA: Diagnosis not present

## 2016-09-05 DIAGNOSIS — Z1329 Encounter for screening for other suspected endocrine disorder: Secondary | ICD-10-CM

## 2016-09-05 DIAGNOSIS — Z0000 Encounter for general adult medical examination without abnormal findings: Secondary | ICD-10-CM | POA: Diagnosis not present

## 2016-09-05 DIAGNOSIS — R7309 Other abnormal glucose: Secondary | ICD-10-CM

## 2016-09-05 DIAGNOSIS — F419 Anxiety disorder, unspecified: Secondary | ICD-10-CM

## 2016-09-05 DIAGNOSIS — Z1231 Encounter for screening mammogram for malignant neoplasm of breast: Secondary | ICD-10-CM

## 2016-09-05 DIAGNOSIS — I10 Essential (primary) hypertension: Secondary | ICD-10-CM | POA: Diagnosis not present

## 2016-09-05 LAB — CBC WITH DIFFERENTIAL/PLATELET
Basophils Absolute: 0 10*3/uL (ref 0.0–0.1)
Basophils Relative: 0.3 % (ref 0.0–3.0)
Eosinophils Absolute: 0.2 10*3/uL (ref 0.0–0.7)
Eosinophils Relative: 4 % (ref 0.0–5.0)
HCT: 37.9 % (ref 36.0–46.0)
Hemoglobin: 12.6 g/dL (ref 12.0–15.0)
Lymphocytes Relative: 31.9 % (ref 12.0–46.0)
Lymphs Abs: 1.5 10*3/uL (ref 0.7–4.0)
MCHC: 33.2 g/dL (ref 30.0–36.0)
MCV: 85.4 fl (ref 78.0–100.0)
Monocytes Absolute: 0.3 10*3/uL (ref 0.1–1.0)
Monocytes Relative: 7.2 % (ref 3.0–12.0)
Neutro Abs: 2.7 10*3/uL (ref 1.4–7.7)
Neutrophils Relative %: 56.6 % (ref 43.0–77.0)
Platelets: 349 10*3/uL (ref 150.0–400.0)
RBC: 4.44 Mil/uL (ref 3.87–5.11)
RDW: 16.2 % — ABNORMAL HIGH (ref 11.5–15.5)
WBC: 4.8 10*3/uL (ref 4.0–10.5)

## 2016-09-05 LAB — LIPID PANEL
Cholesterol: 222 mg/dL — ABNORMAL HIGH (ref 0–200)
HDL: 68 mg/dL (ref >39.00)
LDL Cholesterol: 139 mg/dL — ABNORMAL HIGH (ref 0–99)
NonHDL: 153.85
Total CHOL/HDL Ratio: 3
Triglycerides: 75 mg/dL (ref 0.0–149.0)
VLDL: 15 mg/dL (ref 0.0–40.0)

## 2016-09-05 LAB — BASIC METABOLIC PANEL WITH GFR
BUN: 13 mg/dL (ref 7–25)
CO2: 31 mmol/L (ref 20–31)
Calcium: 9 mg/dL (ref 8.6–10.2)
Chloride: 102 mmol/L (ref 98–110)
Creat: 0.71 mg/dL (ref 0.50–1.10)
GFR, Est African American: >89 mL/min (ref >=60)
GFR, Est Non African American: >89 mL/min (ref >=60)
Glucose, Bld: 83 mg/dL (ref 65–99)
Potassium: 4.1 mmol/L (ref 3.5–5.3)
Sodium: 141 mmol/L (ref 135–146)

## 2016-09-05 LAB — TSH: TSH: 1.81 u[IU]/mL (ref 0.35–4.50)

## 2016-09-05 MED ORDER — ESCITALOPRAM OXALATE 20 MG PO TABS: 20.0000 mg | ORAL_TABLET | Freq: Every day | ORAL | 1 refills | Status: DC

## 2016-09-05 MED ORDER — AMLODIPINE BESYLATE 5 MG PO TABS: 5.0000 mg | ORAL_TABLET | Freq: Every day | ORAL | 1 refills | Status: DC

## 2016-09-05 NOTE — Progress Notes (Signed)
Patient ID: Alexandria Weber, female  DOB: 1966-07-07, 51 y.o.   MRN: [id number] Patient Care Team    Relationship Specialty Notifications Start End  Ma Hillock, DO PCP - General Family Medicine  07/27/15     Subjective:  Alexandria Weber is a 51 y.o.  Female  present for CPE. All past medical history, surgical history, allergies, family history, immunizations, medications and social history were updated in the electronic medical record today. All recent labs, ED visits and hospitalizations within the last year were reviewed.  Health maintenance:  Colonoscopy: no fhx, screen 50 Mammogram: completed:03/25/2015, birads 1. No fhx. Scheduled today for mammogram. Cervical cancer screening: last pap: 2015, results: WNL, completed by: Dr. Elon Alas Immunizations: tdap 01/2016, Influenza updated today (encouraged yearly) Infectious disease screening: HIV completed DEXA: N/A Assistive device: none Oxygen [id number] Patient has a Dental home. Hospitalizations/ED visits: None  Immunization History  Administered Date(s) Administered  . Influenza,inj,Quad PF,36+ Mos 07/27/2015, 09/05/2016  . Tdap 01/25/2016     Past Medical History:  Diagnosis Date  . Anxiety   . Depression   . Fibroadenoma of breast    H/O IN LEFT BREAST NO CHANGE PER [place]  . Heart murmur   . High cholesterol   . Hypertension   . Migraine headache   . PMS (premenstrual syndrome)    Allergies  Allergen Reactions  . Penicillins Hives  . Sulfa Antibiotics Hives   Past Surgical History:  Procedure Laterality Date  . CESAREAN SECTION     x2  . TONSILLECTOMY     Family History  Problem Relation Age of Onset  . Heart disease Mother   . Hearing loss Mother   . Heart disease Father   . Hypertension Father   . Stroke Father   . Arthritis Father   . Hypertension Maternal Grandfather   . Hypertension Paternal Grandfather   . Heart disease Paternal Grandfather   . Drug abuse Brother     committed  suicide  . Diabetes Mellitus II Maternal Aunt   . Diabetes Mellitus II Paternal Aunt   . Heart disease Paternal Uncle   . Colon cancer Neg Hx   . Breast cancer Neg Hx    Social History   Social History  . Marital status: Married    Spouse name: N/A  . Number of children: N/A  . Years of education: N/A   Occupational History  . Not on file.   Social History Main Topics  . Smoking status: Never Smoker  . Smokeless tobacco: Never Used  . Alcohol use No  . Drug use: No  . Sexual activity: Yes    Birth control/ protection: None   Other Topics Concern  . Not on file   Social History Narrative   Married. Spouse is name Suezanne Jacquet. 3 children.   High school graduate. Full-time employed. 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   Drinks caffeinated beverages, no tobacco, no alcohol, no recreational drugs.   Wears her seatbelt. Smoke detectors located in the home. Firearms located in the home in a locked case.   Feels safe in her relationship.   Allergies as of 09/05/2016      Reactions   Penicillins Hives   Sulfa Antibiotics Hives      Medication List       Accurate as of 09/05/16  9:29 AM. Always use your most recent med list.          amLODipine 5 MG tablet Commonly known as:  NORVASC Take 1 tablet (5 mg total) by mouth daily. Needs office visit prior to anymore refills.   escitalopram 20 MG tablet Commonly known as:  LEXAPRO Take 1 tablet (20 mg total) by mouth daily.        No results found for this or any previous visit (from the past 2160 hour(s)).  Mm Digital Screening Bilateral  Result Date: 03/25/2015 CLINICAL DATA:  Screening. EXAM: DIGITAL SCREENING BILATERAL MAMMOGRAM WITH CAD COMPARISON:  Previous exam(s). ACR Breast Density Category b: There are scattered areas of fibroglandular density. FINDINGS: There are no findings suspicious for malignancy. Images were processed with CAD. IMPRESSION: No mammographic evidence of malignancy. A result letter of this screening  mammogram will be mailed directly to the patient. RECOMMENDATION: Screening mammogram in one year. (Code:SM-B-01Y) BI-RADS CATEGORY  1: Negative. Electronically Signed   By: Ammie Ferrier M.D.   On: 03/25/2015 16:20   ROS: 14 pt review of systems performed and negative (unless mentioned in an HPI)  Objective: BP 105/70 (BP Location: Left Arm, Patient Position: Sitting, Cuff Size: Normal)   Pulse 72   Temp 98.3 F (36.8 C) (Oral)   Resp 16   Ht 5\' 5"  (1.651 m)   Wt 152 lb (68.9 kg)   SpO2 97%   BMI 25.29 kg/m  Gen: Afebrile. No acute distress. Nontoxic in appearance, well-developed, well-nourished,  pleasant caucasian female.  HENT: AT. [place]. Bilateral TM visualized and normal in appearance, normal external auditory canal. MMM, no oral lesions, adequate dentition. Bilateral nares within normal limits. Throat without erythema, ulcerations or exudates. no Cough on exam, no hoarseness on exam. Eyes:Pupils Equal Round Reactive to light, Extraocular movements intact,  Conjunctiva without redness, discharge or icterus. Neck/lymp/endocrine: Supple,no lymphadenopathy, no thyromegaly CV: RRR, noedema, +2/4 P posterior tibialis pulses. no carotid bruits. No JVD. Chest: CTAB, no wheeze, rhonchi or crackles. normal Respiratory effort. good Air movement. Abd: Soft. flat. NTND. BS present. no Masses palpated. No hepatosplenomegaly. No rebound tenderness or guarding. Skin: no rashes, purpura or petechiae. Warm and well-perfused. Skin intact. Neuro/Msk:  Normal gait. PERLA. EOMi. Alert. Oriented x3.  Cranial nerves II through XII intact. Muscle strength 5/5 upper/lower extremity. DTRs equal bilaterally. Psych: Normal affect, dress and demeanor. Normal speech. Normal thought content and judgment.   Assessment/plan: Alexandria Weber is a 51 y.o. female present for CPE Encounter for preventive health examination Patient was encouraged to exercise greater than 150 minutes a week. Patient was encouraged  to choose a diet filled with fresh fruits and vegetables, and lean meats. AVS provided to patient today for education/recommendation on gender specific health and safety maintenance. Colonoscopy: no fhx, screen 50 Mammogram: completed:03/25/2015, birads 1. No fhx. Scheduled today for mammogram. Cervical cancer screening: last pap: 2015, results: WNL, completed by: Dr. Elon Alas Immunizations: tdap 01/2016, Influenza updated today (encouraged yearly) Infectious disease screening: HIV completed Thyroid disorder screen - TSH Elevated hemoglobin A1c - Hemoglobin A1c Hyperlipidemia LDL goal <100 - Lipid panel Need for influenza vaccination - Flu Vaccine QUAD 36+ mos IM .Essential hypertension - stable. discussed halving dose to 2.5 mg QD of amlodipine, if BP remains below 130/80 continue at 2.5 mg, otherwise return to 5 mg dose.  - low salt diet, exercise encouraged.  - amLODipine (NORVASC) 5 MG tablet; Take 1 tablet (5 mg total) by mouth daily. Needs office visit prior to anymore refills.  Dispense: 90 tablet; Refill: 1 - CBC w/Diff - BASIC METABOLIC PANEL WITH GFR  Anxiety - stable.  - escitalopram (LEXAPRO)  20 MG tablet; Take 1 tablet (20 mg total) by mouth daily.  Dispense: 90 tablet; Refill: 1 - amLODipine (NORVASC) 5 MG tablet; Take 1 tablet (5 mg total) by mouth daily. Needs office visit prior to anymore refills.  Dispense: 90 tablet; Refill: 1    Return in about 1 year (around 09/05/2017) for CPE.  Electronically signed by: Howard Pouch, DO [place]

## 2016-09-05 NOTE — Patient Instructions (Signed)
Half the amlodipine dose and if your BP remains below 130/80 you can stay on the half dose, otherwise return to 5 mg dose. If you end up on half dose please call in to make [place] aware.  Health Maintenance, Female Introduction Adopting a healthy lifestyle and getting preventive care can go a long way to promote health and wellness. Talk with your health care provider about what schedule of regular examinations is right for you. This is a good chance for you to check in with your provider about disease prevention and staying healthy. In between checkups, there are plenty of things you can do on your own. Experts have done a lot of research about which lifestyle changes and preventive measures are most likely to keep you healthy. Ask your health care provider for more information. Weight and diet Eat a healthy diet  Be sure to include plenty of vegetables, fruits, low-fat dairy products, and lean protein.  Do not eat a lot of foods high in solid fats, added sugars, or salt.  Get regular exercise. This is one of the most important things you can do for your health.  Most adults should exercise for at least 150 minutes each week. The exercise should increase your heart rate and make you sweat (moderate-intensity exercise).  Most adults should also do strengthening exercises at least twice a week. This is in addition to the moderate-intensity exercise. Maintain a healthy weight  Body mass index (BMI) is a measurement that can be used to identify possible weight problems. It estimates body fat based on height and weight. Your health care provider can help determine your BMI and help you achieve or maintain a healthy weight.  For females 64 years of age and older:  A BMI below 18.5 is considered underweight.  A BMI of 18.5 to 24.9 is normal.  A BMI of 25 to 29.9 is considered overweight.  A BMI of 30 and above is considered obese. Watch levels of cholesterol and blood lipids  You should start  having your blood tested for lipids and cholesterol at 49 years of age, then have this test every 5 years.  You may need to have your cholesterol levels checked more often if:  Your lipid or cholesterol levels are high.  You are older than 49 years of age.  You are at high risk for heart disease. Cancer screening Lung Cancer  Lung cancer screening is recommended for adults 75-44 years old who are at high risk for lung cancer because of a history of smoking.  A yearly low-dose CT scan of the lungs is recommended for people who:  Currently smoke.  Have quit within the past 15 years.  Have at least a 30-pack-year history of smoking. A pack year is smoking an average of one pack of cigarettes a day for 1 year.  Yearly screening should continue until it has been 15 years since you quit.  Yearly screening should stop if you develop a health problem that would prevent you from having lung cancer treatment. Breast Cancer  Practice breast self-awareness. This means understanding how your breasts normally appear and feel.  It also means doing regular breast self-exams. Let your health care provider know about any changes, no matter how small.  If you are in your 20s or 30s, you should have a clinical breast exam (CBE) by a health care provider every 1-3 years as part of a regular health exam.  If you are 2 or older, have a CBE every  year. Also consider having a breast X-ray (mammogram) every year.  If you have a family history of breast cancer, talk to your health care provider about genetic screening.  If you are at high risk for breast cancer, talk to your health care provider about having an MRI and a mammogram every year.  Breast cancer gene (BRCA) assessment is recommended for women who have family members with BRCA-related cancers. BRCA-related cancers include:  Breast.  Ovarian.  Tubal.  Peritoneal cancers.  Results of the assessment will determine the need for genetic  counseling and BRCA1 and BRCA2 testing. Cervical Cancer  Your health care provider may recommend that you be screened regularly for cancer of the pelvic organs (ovaries, uterus, and vagina). This screening involves a pelvic examination, including checking for microscopic changes to the surface of your cervix (Pap test). You may be encouraged to have this screening done every 3 years, beginning at age 19.  For women ages 18-65, health care providers may recommend pelvic exams and Pap testing every 3 years, or they may recommend the Pap and pelvic exam, combined with testing for human papilloma virus (HPV), every 5 years. Some types of HPV increase your risk of cervical cancer. Testing for HPV may also be done on women of any age with unclear Pap test results.  Other health care providers may not recommend any screening for nonpregnant women who are considered low risk for pelvic cancer and who do not have symptoms. Ask your health care provider if a screening pelvic exam is right for you.  If you have had past treatment for cervical cancer or a condition that could lead to cancer, you need Pap tests and screening for cancer for at least 20 years after your treatment. If Pap tests have been discontinued, your risk factors (such as having a new sexual partner) need to be reassessed to determine if screening should resume. Some women have medical problems that increase the chance of getting cervical cancer. In these cases, your health care provider may recommend more frequent screening and Pap tests. Colorectal Cancer  This type of cancer can be detected and often prevented.  Routine colorectal cancer screening usually begins at 49 years of age and continues through 49 years of age.  Your health care provider may recommend screening at an earlier age if you have risk factors for colon cancer.  Your health care provider may also recommend using home test kits to check for hidden blood in the stool.  A  small camera at the end of a tube can be used to examine your colon directly (sigmoidoscopy or colonoscopy). This is done to check for the earliest forms of colorectal cancer.  Routine screening usually begins at age 13.  Direct examination of the colon should be repeated every 5-10 years through 49 years of age. However, you may need to be screened more often if early forms of precancerous polyps or small growths are found. Skin Cancer  Check your skin from head to toe regularly.  Tell your health care provider about any new moles or changes in moles, especially if there is a change in a mole's shape or color.  Also tell your health care provider if you have a mole that is larger than the size of a pencil eraser.  Always use sunscreen. Apply sunscreen liberally and repeatedly throughout the day.  Protect yourself by wearing long sleeves, pants, a wide-brimmed hat, and sunglasses whenever you are outside. Heart disease, diabetes, and high blood pressure  High blood pressure causes heart disease and increases the risk of stroke. High blood pressure is more likely to develop in:  People who have blood pressure in the high end of the normal range (130-139/85-89 mm Hg).  People who are overweight or obese.  People who are African American.  If you are 38-93 years of age, have your blood pressure checked every 3-5 years. If you are 17 years of age or older, have your blood pressure checked every year. You should have your blood pressure measured twice-once when you are at a hospital or clinic, and once when you are not at a hospital or clinic. Record the average of the two measurements. To check your blood pressure when you are not at a hospital or clinic, you can use:  An automated blood pressure machine at a pharmacy.  A home blood pressure monitor.  If you are between 59 years and 36 years old, ask your health care provider if you should take aspirin to prevent strokes.  Have regular  diabetes screenings. This involves taking a blood sample to check your fasting blood sugar level.  If you are at a normal weight and have a low risk for diabetes, have this test once every three years after 49 years of age.  If you are overweight and have a high risk for diabetes, consider being tested at a younger age or more often. Preventing infection Hepatitis B  If you have a higher risk for hepatitis B, you should be screened for this virus. You are considered at high risk for hepatitis B if:  You were born in a country where hepatitis B is common. Ask your health care provider which countries are considered high risk.  Your parents were born in a high-risk country, and you have not been immunized against hepatitis B (hepatitis B vaccine).  You have HIV or AIDS.  You use needles to inject street drugs.  You live with someone who has hepatitis B.  You have had sex with someone who has hepatitis B.  You get hemodialysis treatment.  You take certain medicines for conditions, including cancer, organ transplantation, and autoimmune conditions. Hepatitis C  Blood testing is recommended for:  Everyone born from [birth year] through 1963.  Anyone with known risk factors for hepatitis C. Sexually transmitted infections (STIs)  You should be screened for sexually transmitted infections (STIs) including gonorrhea and chlamydia if:  You are sexually active and are younger than 49 years of age.  You are older than 49 years of age and your health care provider tells you that you are at risk for this type of infection.  Your sexual activity has changed since you were last screened and you are at an increased risk for chlamydia or gonorrhea. Ask your health care provider if you are at risk.  If you do not have HIV, but are at risk, it may be recommended that you take a prescription medicine daily to prevent HIV infection. This is called pre-exposure prophylaxis (PrEP). You are considered at  risk if:  You are sexually active and do not regularly use condoms or know the HIV status of your partner(s).  You take drugs by injection.  You are sexually active with a partner who has HIV. Talk with your health care provider about whether you are at high risk of being infected with HIV. If you choose to begin PrEP, you should first be tested for HIV. You should then be tested every 3 months for as long  as you are taking PrEP. Pregnancy  If you are premenopausal and you may become pregnant, ask your health care provider about preconception counseling.  If you may become pregnant, take 400 to 800 micrograms (mcg) of folic acid every day.  If you want to prevent pregnancy, talk to your health care provider about birth control (contraception). Osteoporosis and menopause  Osteoporosis is a disease in which the bones lose minerals and strength with aging. This can result in serious bone fractures. Your risk for osteoporosis can be identified using a bone density scan.  If you are 50 years of age or older, or if you are at risk for osteoporosis and fractures, ask your health care provider if you should be screened.  Ask your health care provider whether you should take a calcium or vitamin D supplement to lower your risk for osteoporosis.  Menopause may have certain physical symptoms and risks.  Hormone replacement therapy may reduce some of these symptoms and risks. Talk to your health care provider about whether hormone replacement therapy is right for you. Follow these instructions at home:  Schedule regular health, dental, and eye exams.  Stay current with your immunizations.  Do not use any tobacco products including cigarettes, chewing tobacco, or electronic cigarettes.  If you are pregnant, do not drink alcohol.  If you are breastfeeding, limit how much and how often you drink alcohol.  Limit alcohol intake to no more than 1 drink per day for nonpregnant women. One drink  equals 12 ounces of beer, 5 ounces of wine, or 1 ounces of hard liquor.  Do not use street drugs.  Do not share needles.  Ask your health care provider for help if you need support or information about quitting drugs.  Tell your health care provider if you often feel depressed.  Tell your health care provider if you have ever been abused or do not feel safe at home. This information is not intended to replace advice given to you by your health care provider. Make sure you discuss any questions you have with your health care provider. Document Released: 02/06/2011 Document Revised: 12/30/2015 Document Reviewed: 04/27/2015  2017 [employer]   Please help [place] help you:  It is a privilege to be able to take care of great patients such as yourself. We are honored you have chosen [facility] for your Primary Care home. Below you will find basic instructions that you may need to access in the future. Please help [place] help you by reading the instructions, which cover many of the frequent questions we experience.   Prescription refills and request:  -In order to allow more efficient response time, please call your pharmacy for all refills. They will forward the request electronically to [place]. This allows for the quickest possible response. Request left on a nurse line can take longer to refill, since these are checked as time allows between office patients and other phone calls.  - refill request can take up to 3-5 working days to complete.  - If request is sent electronically and request is appropiate, it is usually completed in 1-2 business days.  - all patients will need to be seen routinely for all chronic medical conditions requiring prescription medications (see follow-up below). If you are overdue for follow up on your condition, you will be asked to make an appointment and we will call in enough medication to cover you until your appointment (up to 30 days).  - all controlled substances will  require  a face to face visit to request/refill.  - if you desire your prescriptions to go through a new pharmacy, and have an active script at original pharmacy, you will need to call your pharmacy and have scripts transferred to new pharmacy. This is completed between the pharmacy locations and not by your provider.    Results: If any images or labs were ordered, it can take up to 1 week to get results depending on the test ordered and the lab/facility running and resulting the test. - Normal or stable results, which do not need further discussion, will be released to your mychart immediately with attached note to you. A call will not be generated for normal results. Please make certain to sign up for mychart. If you have questions on how to activate your mychart you can call the front office.  - If your results need further discussion, our office will attempt to contact you via phone, and if unable to reach you after 2 attempts, we will release your abnormal result to your mychart with instructions.  - All results will be automatically released in mychart after 1 week.  - Your provider will provide you with explanation and instruction on all relevant material in your results. Please keep in mind, results and labs may appear confusing or abnormal to the untrained eye, but it does not mean they are actually abnormal for you personally. If you have any questions about your results that are not covered, or you desire more detailed explanation than what was provided, you should make an appointment with your provider to do so.   Our office handles many outgoing and incoming calls daily. If we have not contacted you within 1 week about your results, please check your mychart to see if there is a message first and if not, then contact our office.  In helping with this matter, you help decrease call volume, and therefore allow [place] to be able to respond to patients needs more efficiently.   Acute office visits  (sick visit):  An acute visit is intended for a new problem and are scheduled in shorter time slots to allow schedule openings for patients with new problems. This is the appropriate visit to discuss a new problem. In order to provide you with excellent quality medical care with proper time for you to explain your problem, have an exam and receive treatment with instructions, these appointments should be limited to one new problem per visit. If you experience a new problem, in which you desire to be addressed, please make an acute office visit, we save openings on the schedule to accommodate you. Please do not save your new problem for any other type of visit, let us take care of it properly and quickly for you.   Follow up visits:  Depending on your condition(s) your provider will need to see you routinely in order to provide you with quality care and prescribe medication(s). Most chronic conditions (Example: hypertension, Diabetes, depression/anxiety... etc), require visits a couple times a year. Your provider will instruct you on proper follow up for your personal medical conditions and history. Please make certain to make follow up appointments for your condition as instructed. Failing to do so could result in lapse in your medication treatment/refills. If you request a refill, and are overdue to be seen on a condition, we will always provide you with a 30 day script (once) to allow you time to schedule.    Medicare wellness (well visit): - we have  a wonderful Nurse Maudie Mercury), that will meet with you and provide you will yearly medicare wellness visits. These visits should occur yearly (can not be scheduled less than 1 calendar year apart) and cover preventive health, immunizations, advance directives and screenings you are entitled to yearly through your medicare benefits. Do not miss out on your entitled benefits, this is when medicare will pay for these benefits to be ordered for you.  These are strongly  encouraged by your provider and is the appropriate type of visit to make certain you are up to date with all preventive health benefits. If you have not had your medicare wellness exam in the last 12 months, please make certain to schedule one by calling the office and schedule your medicare wellness with Maudie Mercury as soon as possible.   Yearly physical (well visit):  - Adults are recommended to be seen yearly for physicals. Check with your insurance and date of your last physical, most insurances require one calendar year between physicals. Physicals include all preventive health topics, screenings, medical exam and labs that are appropriate for gender/age and history. You may have fasting labs needed at this visit. This is a well visit (not a sick visit), acute topics should not be covered during this visit.  - Pediatric patients are seen more frequently when they are younger. Your provider will advise you on well child visit timing that is appropriate for your their age. - This is not a medicare wellness visit. Medicare wellness exams do not have an exam portion to the visit. Some medicare companies allow for a physical, some do not allow a yearly physical. If your medicare allows a yearly physical you can schedule the medicare wellness with our nurse Maudie Mercury and have your physical with your provider after, on the same day. Please check with insurance for your full benefits.   Late Policy/No Shows:  - all new patients should arrive 15-30 minutes earlier than appointment to allow [place] time  to  obtain all personal demographics,  insurance information and for you to complete office paperwork. - All established patients should arrive 10-15 minutes earlier than appointment time to update all information and be checked in .  - In our best efforts to run on time, if you are late for your appointment you will be asked to either reschedule or if able, we will work you back into the schedule. There will be a wait time to  work you back in the schedule,  depending on availability.  - If you are unable to make it to your appointment as scheduled, please call 24 hours ahead of time to allow [place] to fill the time slot with someone else who needs to be seen. If you do not cancel your appointment ahead of time, you may be charged a no show fee.

## 2016-09-05 NOTE — Progress Notes (Signed)
Pre visit review using our clinic review tool, if applicable. No additional management support is needed unless otherwise documented below in the visit note. 

## 2016-09-06 ENCOUNTER — Telehealth: Payer: Self-pay | Admitting: Family Medicine

## 2016-09-06 LAB — HEMOGLOBIN A1C
Hgb A1c MFr Bld: 5.1 % (ref <5.7)
Mean Plasma Glucose: 100 mg/dL

## 2016-09-06 NOTE — Telephone Encounter (Signed)
Left message with results on voice mail per DPR.

## 2016-09-06 NOTE — Telephone Encounter (Signed)
Please call pt: - her labs all look good, Her cholesterol is also ok.

## 2016-10-03 ENCOUNTER — Encounter: Payer: Self-pay | Admitting: Women's Health

## 2016-10-03 ENCOUNTER — Ambulatory Visit (INDEPENDENT_AMBULATORY_CARE_PROVIDER_SITE_OTHER): Payer: 59 | Admitting: Women's Health

## 2016-10-03 VITALS — BP 112/70 | Ht 65.0 in | Wt 154.0 lb

## 2016-10-03 DIAGNOSIS — N760 Acute vaginitis: Secondary | ICD-10-CM | POA: Diagnosis not present

## 2016-10-03 DIAGNOSIS — B9689 Other specified bacterial agents as the cause of diseases classified elsewhere: Secondary | ICD-10-CM

## 2016-10-03 DIAGNOSIS — Z1151 Encounter for screening for human papillomavirus (HPV): Secondary | ICD-10-CM

## 2016-10-03 DIAGNOSIS — N938 Other specified abnormal uterine and vaginal bleeding: Secondary | ICD-10-CM | POA: Diagnosis not present

## 2016-10-03 DIAGNOSIS — Z01419 Encounter for gynecological examination (general) (routine) without abnormal findings: Secondary | ICD-10-CM

## 2016-10-03 LAB — WET PREP FOR TRICH, YEAST, CLUE
Trich, Wet Prep: NONE SEEN
WBC, Wet Prep HPF POC: NONE SEEN
Yeast Wet Prep HPF POC: NONE SEEN

## 2016-10-03 LAB — TSH: TSH: 1.74 mIU/L

## 2016-10-03 MED ORDER — MEDROXYPROGESTERONE ACETATE 10 MG PO TABS: 10.0000 mg | ORAL_TABLET | Freq: Every day | ORAL | 0 refills | Status: DC

## 2016-10-03 MED ORDER — METRONIDAZOLE 500 MG PO TABS: 500.0000 mg | ORAL_TABLET | Freq: Two times a day (BID) | ORAL | 0 refills | Status: DC

## 2016-10-03 NOTE — Patient Instructions (Signed)
Bacterial Vaginosis Bacterial vaginosis is an infection of the vagina. It happens when too many germs (bacteria) grow in the vagina. This infection puts you at risk for infections from sex (STIs). Treating this infection can lower your risk for some STIs. You should also treat this if you are pregnant. It can cause your baby to be born early. Follow these instructions at home: Medicines   Take over-the-counter and prescription medicines only as told by your doctor.  Take or use your antibiotic medicine as told by your doctor. Do not stop taking or using it even if you start to feel better. General instructions   If you your sexual partner is a woman, tell her that you have this infection. She needs to get treatment if she has symptoms. If you have a female partner, he does not need to be treated.  During treatment:  Avoid sex.  Do not douche.  Avoid alcohol as told.  Avoid breastfeeding as told.  Drink enough fluid to keep your pee (urine) clear or pale yellow.  Keep your vagina and butt (rectum) clean.  Wash the area with warm water every day.  Wipe from front to back after you use the toilet.  Keep all follow-up visits as told by your doctor. This is important. Preventing this condition   Do not douche.  Use only warm water to wash around your vagina.  Use protection when you have sex. This includes:  Latex condoms.  Dental dams.  Limit how many people you have sex with. It is best to only have sex with the same person (be monogamous).  Get tested for STIs. Have your partner get tested.  Wear underwear that is cotton or lined with cotton.  Avoid tight pants and pantyhose. This is most important in summer.  Do not use any products that have nicotine or tobacco in them. These include cigarettes and e-cigarettes. If you need help quitting, ask your doctor.  Do not use illegal drugs.  Limit how much alcohol you drink. Contact a doctor if:  Your symptoms do not  get better, even after you are treated.  You have more discharge or pain when you pee (urinate).  You have a fever.  You have pain in your belly (abdomen).  You have pain with sex.  Your bleed from your vagina between periods. Summary  This infection happens when too many germs (bacteria) grow in the vagina.  Treating this condition can lower your risk for some infections from sex (STIs).  You should also treat this if you are pregnant. It can cause early (premature) birth.  Do not stop taking or using your antibiotic medicine even if you start to feel better. This information is not intended to replace advice given to you by your health care provider. Make sure you discuss any questions you have with your health care provider. Document Released: 05/02/2008 Document Revised: 04/08/2016 Document Reviewed: 04/08/2016 Elsevier Interactive Patient Education  2017 [place] Maintenance, Female Adopting a healthy lifestyle and getting preventive care can go a long way to promote health and wellness. Talk with your health care provider about what schedule of regular examinations is right for you. This is a good chance for you to check in with your provider about disease prevention and staying healthy. In between checkups, there are plenty of things you can do on your own. Experts have done a lot of research about which lifestyle changes and preventive measures are most likely to keep you healthy. Ask  your health care provider for more information. Weight and diet Eat a healthy diet  Be sure to include plenty of vegetables, fruits, low-fat dairy products, and lean protein.  Do not eat a lot of foods high in solid fats, added sugars, or salt.  Get regular exercise. This is one of the most important things you can do for your health.  Most adults should exercise for at least 150 minutes each week. The exercise should increase your heart rate and make you sweat (moderate-intensity  exercise).  Most adults should also do strengthening exercises at least twice a week. This is in addition to the moderate-intensity exercise. Maintain a healthy weight  Body mass index (BMI) is a measurement that can be used to identify possible weight problems. It estimates body fat based on height and weight. Your health care provider can help determine your BMI and help you achieve or maintain a healthy weight.  For females 99 years of age and older:  A BMI below 18.5 is considered underweight.  A BMI of 18.5 to 24.9 is normal.  A BMI of 25 to 29.9 is considered overweight.  A BMI of 30 and above is considered obese. Watch levels of cholesterol and blood lipids  You should start having your blood tested for lipids and cholesterol at 49 years of age, then have this test every 5 years.  You may need to have your cholesterol levels checked more often if:  Your lipid or cholesterol levels are high.  You are older than 49 years of age.  You are at high risk for heart disease. Cancer screening Lung Cancer  Lung cancer screening is recommended for adults 33-17 years old who are at high risk for lung cancer because of a history of smoking.  A yearly low-dose CT scan of the lungs is recommended for people who:  Currently smoke.  Have quit within the past 15 years.  Have at least a 30-pack-year history of smoking. A pack year is smoking an average of one pack of cigarettes a day for 1 year.  Yearly screening should continue until it has been 15 years since you quit.  Yearly screening should stop if you develop a health problem that would prevent you from having lung cancer treatment. Breast Cancer  Practice breast self-awareness. This means understanding how your breasts normally appear and feel.  It also means doing regular breast self-exams. Let your health care provider know about any changes, no matter how small.  If you are in your 20s or 30s, you should have a clinical  breast exam (CBE) by a health care provider every 1-3 years as part of a regular health exam.  If you are 16 or older, have a CBE every year. Also consider having a breast X-ray (mammogram) every year.  If you have a family history of breast cancer, talk to your health care provider about genetic screening.  If you are at high risk for breast cancer, talk to your health care provider about having an MRI and a mammogram every year.  Breast cancer gene (BRCA) assessment is recommended for women who have family members with BRCA-related cancers. BRCA-related cancers include:  Breast.  Ovarian.  Tubal.  Peritoneal cancers.  Results of the assessment will determine the need for genetic counseling and BRCA1 and BRCA2 testing. Cervical Cancer  Your health care provider may recommend that you be screened regularly for cancer of the pelvic organs (ovaries, uterus, and vagina). This screening involves a pelvic examination, including  checking for microscopic changes to the surface of your cervix (Pap test). You may be encouraged to have this screening done every 3 years, beginning at age 62.  For women ages 64-65, health care providers may recommend pelvic exams and Pap testing every 3 years, or they may recommend the Pap and pelvic exam, combined with testing for human papilloma virus (HPV), every 5 years. Some types of HPV increase your risk of cervical cancer. Testing for HPV may also be done on women of any age with unclear Pap test results.  Other health care providers may not recommend any screening for nonpregnant women who are considered low risk for pelvic cancer and who do not have symptoms. Ask your health care provider if a screening pelvic exam is right for you.  If you have had past treatment for cervical cancer or a condition that could lead to cancer, you need Pap tests and screening for cancer for at least 20 years after your treatment. If Pap tests have been discontinued, your risk  factors (such as having a new sexual partner) need to be reassessed to determine if screening should resume. Some women have medical problems that increase the chance of getting cervical cancer. In these cases, your health care provider may recommend more frequent screening and Pap tests. Colorectal Cancer  This type of cancer can be detected and often prevented.  Routine colorectal cancer screening usually begins at 49 years of age and continues through 48 years of age.  Your health care provider may recommend screening at an earlier age if you have risk factors for colon cancer.  Your health care provider may also recommend using home test kits to check for hidden blood in the stool.  A small camera at the end of a tube can be used to examine your colon directly (sigmoidoscopy or colonoscopy). This is done to check for the earliest forms of colorectal cancer.  Routine screening usually begins at age 33.  Direct examination of the colon should be repeated every 5-10 years through 49 years of age. However, you may need to be screened more often if early forms of precancerous polyps or small growths are found. Skin Cancer  Check your skin from head to toe regularly.  Tell your health care provider about any new moles or changes in moles, especially if there is a change in a mole's shape or color.  Also tell your health care provider if you have a mole that is larger than the size of a pencil eraser.  Always use sunscreen. Apply sunscreen liberally and repeatedly throughout the day.  Protect yourself by wearing long sleeves, pants, a wide-brimmed hat, and sunglasses whenever you are outside. Heart disease, diabetes, and high blood pressure  High blood pressure causes heart disease and increases the risk of stroke. High blood pressure is more likely to develop in:  People who have blood pressure in the high end of the normal range (130-139/85-89 mm Hg).  People who are overweight or  obese.  People who are African American.  If you are 85-52 years of age, have your blood pressure checked every 3-5 years. If you are 21 years of age or older, have your blood pressure checked every year. You should have your blood pressure measured twice-once when you are at a hospital or clinic, and once when you are not at a hospital or clinic. Record the average of the two measurements. To check your blood pressure when you are not at a hospital or  clinic, you can use:  An automated blood pressure machine at a pharmacy.  A home blood pressure monitor.  If you are between 4 years and 14 years old, ask your health care provider if you should take aspirin to prevent strokes.  Have regular diabetes screenings. This involves taking a blood sample to check your fasting blood sugar level.  If you are at a normal weight and have a low risk for diabetes, have this test once every three years after 49 years of age.  If you are overweight and have a high risk for diabetes, consider being tested at a younger age or more often. Preventing infection Hepatitis B  If you have a higher risk for hepatitis B, you should be screened for this virus. You are considered at high risk for hepatitis B if:  You were born in a country where hepatitis B is common. Ask your health care provider which countries are considered high risk.  Your parents were born in a high-risk country, and you have not been immunized against hepatitis B (hepatitis B vaccine).  You have HIV or AIDS.  You use needles to inject street drugs.  You live with someone who has hepatitis B.  You have had sex with someone who has hepatitis B.  You get hemodialysis treatment.  You take certain medicines for conditions, including cancer, organ transplantation, and autoimmune conditions. Hepatitis C  Blood testing is recommended for:  Everyone born from [birth year] through 1963.  Anyone with known risk factors for hepatitis C. Sexually  transmitted infections (STIs)  You should be screened for sexually transmitted infections (STIs) including gonorrhea and chlamydia if:  You are sexually active and are younger than 49 years of age.  You are older than 49 years of age and your health care provider tells you that you are at risk for this type of infection.  Your sexual activity has changed since you were last screened and you are at an increased risk for chlamydia or gonorrhea. Ask your health care provider if you are at risk.  If you do not have HIV, but are at risk, it may be recommended that you take a prescription medicine daily to prevent HIV infection. This is called pre-exposure prophylaxis (PrEP). You are considered at risk if:  You are sexually active and do not regularly use condoms or know the HIV status of your partner(s).  You take drugs by injection.  You are sexually active with a partner who has HIV. Talk with your health care provider about whether you are at high risk of being infected with HIV. If you choose to begin PrEP, you should first be tested for HIV. You should then be tested every 3 months for as long as you are taking PrEP. Pregnancy  If you are premenopausal and you may become pregnant, ask your health care provider about preconception counseling.  If you may become pregnant, take 400 to 800 micrograms (mcg) of folic acid every day.  If you want to prevent pregnancy, talk to your health care provider about birth control (contraception). Osteoporosis and menopause  Osteoporosis is a disease in which the bones lose minerals and strength with aging. This can result in serious bone fractures. Your risk for osteoporosis can be identified using a bone density scan.  If you are 38 years of age or older, or if you are at risk for osteoporosis and fractures, ask your health care provider if you should be screened.  Ask your health care provider  whether you should take a calcium or vitamin D  supplement to lower your risk for osteoporosis.  Menopause may have certain physical symptoms and risks.  Hormone replacement therapy may reduce some of these symptoms and risks. Talk to your health care provider about whether hormone replacement therapy is right for you. Follow these instructions at home:  Schedule regular health, dental, and eye exams.  Stay current with your immunizations.  Do not use any tobacco products including cigarettes, chewing tobacco, or electronic cigarettes.  If you are pregnant, do not drink alcohol.  If you are breastfeeding, limit how much and how often you drink alcohol.  Limit alcohol intake to no more than 1 drink per day for nonpregnant women. One drink equals 12 ounces of beer, 5 ounces of wine, or 1 ounces of hard liquor.  Do not use street drugs.  Do not share needles.  Ask your health care provider for help if you need support or information about quitting drugs.  Tell your health care provider if you often feel depressed.  Tell your health care provider if you have ever been abused or do not feel safe at home. This information is not intended to replace advice given to you by your health care provider. Make sure you discuss any questions you have with your health care provider. Document Released: 02/06/2011 Document Revised: 12/30/2015 Document Reviewed: 04/27/2015 Elsevier Interactive Patient Education  2017 [employer].

## 2016-10-03 NOTE — Progress Notes (Signed)
Alexandria Weber 1966-05-22 [id number]    History:    Presents for annual exam.  Regular monthly cycle until December, skipped January, has now been bleeding 2-3 weeks light flow with occasional clots. Condoms. Denies menopausal symptoms. Hypertension and anxiety/ depression managed by primary care. Normal hemoglobin A1c. Normal Pap and mammogram history.  Past medical history, past surgical history, family history and social history were all reviewed and documented in the EPIC chart. 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 Mother hypertension and stroke.  ROS:  A ROS was performed and pertinent positives and negatives are included.  Exam:  Vitals:   10/03/16 0829  BP: 112/70  Weight: 154 lb (69.9 kg)  Height: 5\' 5"  (1.651 m)   Body mass index is 25.63 kg/m.   General appearance:  Normal Thyroid:  Symmetrical, normal in size, without palpable masses or nodularity. Respiratory  Auscultation:  Clear without wheezing or rhonchi Cardiovascular  Auscultation:  Regular rate, without rubs, murmurs or gallops  Edema/varicosities:  Not grossly evident Abdominal  Soft,nontender, without masses, guarding or rebound.  Liver/spleen:  No organomegaly noted  Hernia:  None appreciated  Skin  Inspection:  Grossly normal   Breasts: Examined lying and sitting.     Right: Without masses, retractions, discharge or axillary adenopathy.     Left: Without masses, retractions, discharge or axillary adenopathy. Gentitourinary   Inguinal/mons:  Normal without inguinal adenopathy  External genitalia:  Normal  BUS/Urethra/Skene's glands:  Normal  Vagina:  Normal Small amount of blood with odor, wet prep positive for moderate clues, TNTC bacteria  Cervix:  Normal  Uterus:   normal in size, shape and contour.  Midline and mobile  Adnexa/parametria:     Rt: Without masses or tenderness.   Lt: Without masses or tenderness.  Anus and perineum: Normal  Digital rectal exam: Normal sphincter tone  without palpated masses or tenderness  Assessment/Plan:  51 y.o. MWF G3 P3 for annual exam with irregular bleeding for the past 2-1/2 weeks.  Regular monthly cycles until December Bacterial vaginosis  Irregular bleeding past 2-1/2 weeks Hypertension, anxiety/depression-primary care manages labs and meds  Plan: Vaginal 500 twice daily for 7 days, alcohol precautions reviewed. Quantitative hCG if negative Provera 10 for 10 days. Reviewed if irregular bleeding occurs again will proceed with  sonohysterogram with Dr. Toney Rakes. Instructed to call if bleeding does not stop. SBE's, continue annual screening mammogram, calcium rich diet, increase regular exercise, vitamin D 2000 daily encouraged. TSH, Pap with HR HPV typing.   Alexandria Weber [facility], 9:06 AM 10/03/2016

## 2016-10-03 NOTE — Addendum Note (Signed)
Addended by: Thurnell Garbe A on: 10/03/2016 10:09 AM   Modules accepted: Orders

## 2016-10-04 LAB — HCG, SERUM, QUALITATIVE: Preg, Serum: NEGATIVE

## 2016-10-05 LAB — PAP, TP IMAGING W/ HPV RNA, RFLX HPV TYPE 16,18/45: HPV mRNA, High Risk: NOT DETECTED

## 2016-12-20 ENCOUNTER — Encounter: Payer: Self-pay | Admitting: Gynecology

## 2017-03-26 ENCOUNTER — Encounter: Payer: Self-pay | Admitting: *Deleted

## 2017-03-26 ENCOUNTER — Other Ambulatory Visit: Payer: Self-pay | Admitting: *Deleted

## 2017-03-26 DIAGNOSIS — F419 Anxiety disorder, unspecified: Secondary | ICD-10-CM

## 2017-03-26 DIAGNOSIS — I10 Essential (primary) hypertension: Secondary | ICD-10-CM

## 2017-03-26 MED ORDER — AMLODIPINE BESYLATE 5 MG PO TABS: 5.0000 mg | ORAL_TABLET | Freq: Every day | ORAL | 0 refills | Status: DC

## 2017-03-26 NOTE — Telephone Encounter (Signed)
Amlodipine refilled for 30 day supply patient needs office visit prior to anymore refills. Sent message to patient in My Chart.

## 2017-04-13 ENCOUNTER — Other Ambulatory Visit: Payer: Self-pay | Admitting: *Deleted

## 2017-04-13 ENCOUNTER — Encounter: Payer: Self-pay | Admitting: *Deleted

## 2017-04-13 DIAGNOSIS — F419 Anxiety disorder, unspecified: Secondary | ICD-10-CM

## 2017-04-13 DIAGNOSIS — I10 Essential (primary) hypertension: Secondary | ICD-10-CM

## 2017-04-13 MED ORDER — ESCITALOPRAM OXALATE 20 MG PO TABS: 20.0000 mg | ORAL_TABLET | Freq: Every day | ORAL | 0 refills | Status: DC

## 2017-04-13 NOTE — Telephone Encounter (Signed)
Refilled lexapro for 30 day supply. Patient need to be seen prior to anymore refills. Sent message in [facility] Chart.

## 2017-04-17 ENCOUNTER — Other Ambulatory Visit: Payer: Self-pay | Admitting: *Deleted

## 2017-04-17 DIAGNOSIS — I10 Essential (primary) hypertension: Secondary | ICD-10-CM

## 2017-04-17 DIAGNOSIS — F419 Anxiety disorder, unspecified: Secondary | ICD-10-CM

## 2017-04-17 MED ORDER — AMLODIPINE BESYLATE 5 MG PO TABS: 5.0000 mg | ORAL_TABLET | Freq: Every day | ORAL | 0 refills | Status: DC

## 2017-04-17 NOTE — Telephone Encounter (Signed)
Called and left message on patient voice mail sent 14 day supply of amlodipine to patient pharmacy had previously sent 2 MY Chart messages letting patient know she needs office visit. No additional refills will be authorized until patient is seen.

## 2017-05-17 ENCOUNTER — Other Ambulatory Visit: Payer: Self-pay | Admitting: Family Medicine

## 2017-05-17 DIAGNOSIS — I10 Essential (primary) hypertension: Secondary | ICD-10-CM

## 2017-05-17 DIAGNOSIS — F419 Anxiety disorder, unspecified: Secondary | ICD-10-CM

## 2017-05-24 DIAGNOSIS — Z23 Encounter for immunization: Secondary | ICD-10-CM | POA: Diagnosis not present

## 2017-07-05 ENCOUNTER — Encounter: Payer: Self-pay | Admitting: Family Medicine

## 2017-07-05 ENCOUNTER — Ambulatory Visit (INDEPENDENT_AMBULATORY_CARE_PROVIDER_SITE_OTHER): Payer: 59 | Admitting: Family Medicine

## 2017-07-05 VITALS — BP 128/77 | HR 71 | Temp 98.0°F | Resp 20 | Wt 152.8 lb

## 2017-07-05 DIAGNOSIS — I10 Essential (primary) hypertension: Secondary | ICD-10-CM

## 2017-07-05 DIAGNOSIS — F419 Anxiety disorder, unspecified: Secondary | ICD-10-CM

## 2017-07-05 DIAGNOSIS — E782 Mixed hyperlipidemia: Secondary | ICD-10-CM | POA: Diagnosis not present

## 2017-07-05 DIAGNOSIS — E663 Overweight: Secondary | ICD-10-CM | POA: Diagnosis not present

## 2017-07-05 MED ORDER — AMLODIPINE BESYLATE 5 MG PO TABS: 5.0000 mg | ORAL_TABLET | Freq: Every day | ORAL | 1 refills | Status: DC

## 2017-07-05 MED ORDER — VENLAFAXINE HCL 37.5 MG PO TABS: ORAL_TABLET | ORAL | 0 refills | Status: DC

## 2017-07-05 MED ORDER — AMLODIPINE BESYLATE 2.5 MG PO TABS
2.5000 mg | ORAL_TABLET | Freq: Every day | ORAL | 1 refills | Status: DC
Start: 2017-07-05 — End: 2017-10-10

## 2017-07-05 NOTE — Patient Instructions (Signed)
Stop lexapro and start effexor taper next day. Follow up 4 weeks on this medication.    Your BP looks good. I have refilled your amlodipine.    I hope you have great holidays.    Please help [place] help you:  We are honored you have chosen [facility] for your Primary Care home. Below you will find basic instructions that you may need to access in the future. Please help [place] help you by reading the instructions, which cover many of the frequent questions we experience.   Prescription refills and request:  -In order to allow more efficient response time, please call your pharmacy for all refills. They will forward the request electronically to [place]. This allows for the quickest possible response. Request left on a nurse line can take longer to refill, since these are checked as time allows between office patients and other phone calls.  - refill request can take up to 3-5 working days to complete.  - If request is sent electronically and request is appropiate, it is usually completed in 1-2 business days.  - all patients will need to be seen routinely for all chronic medical conditions requiring prescription medications (see follow-up below). If you are overdue for follow up on your condition, you will be asked to make an appointment and we will call in enough medication to cover you until your appointment (up to 30 days).  - all controlled substances will require a face to face visit to request/refill.  - if you desire your prescriptions to go through a new pharmacy, and have an active script at original pharmacy, you will need to call your pharmacy and have scripts transferred to new pharmacy. This is completed between the pharmacy locations and not by your provider.    Results: If any images or labs were ordered, it can take up to 1 week to get results depending on the test ordered and the lab/facility running and resulting the test. - Normal or stable results, which do not need further  discussion, may be released to your mychart immediately with attached note to you. A call may not be generated for normal results. Please make certain to sign up for mychart. If you have questions on how to activate your mychart you can call the front office.  - If your results need further discussion, our office will attempt to contact you via phone, and if unable to reach you after 2 attempts, we will release your abnormal result to your mychart with instructions.  - All results will be automatically released in mychart after 1 week.  - Your provider will provide you with explanation and instruction on all relevant material in your results. Please keep in mind, results and labs may appear confusing or abnormal to the untrained eye, but it does not mean they are actually abnormal for you personally. If you have any questions about your results that are not covered, or you desire more detailed explanation than what was provided, you should make an appointment with your provider to do so.   Our office handles many outgoing and incoming calls daily. If we have not contacted you within 1 week about your results, please check your mychart to see if there is a message first and if not, then contact our office.  In helping with this matter, you help decrease call volume, and therefore allow [place] to be able to respond to patients needs more efficiently.   Acute office visits (sick visit):  An acute  visit is intended for a new problem and are scheduled in shorter time slots to allow schedule openings for patients with new problems. This is the appropriate visit to discuss a new problem. In order to provide you with excellent quality medical care with proper time for you to explain your problem, have an exam and receive treatment with instructions, these appointments should be limited to one new problem per visit. If you experience a new problem, in which you desire to be addressed, please make an acute office visit,  we save openings on the schedule to accommodate you. Please do not save your new problem for any other type of visit, let us take care of it properly and quickly for you.   Follow up visits:  Depending on your condition(s) your provider will need to see you routinely in order to provide you with quality care and prescribe medication(s). Most chronic conditions (Example: hypertension, Diabetes, depression/anxiety... etc), require visits a couple times a year. Your provider will instruct you on proper follow up for your personal medical conditions and history. Please make certain to make follow up appointments for your condition as instructed. Failing to do so could result in lapse in your medication treatment/refills. If you request a refill, and are overdue to be seen on a condition, we will always provide you with a 30 day script (once) to allow you time to schedule.    Medicare wellness (well visit): - we have a wonderful Nurse Maudie Mercury), that will meet with you and provide you will yearly medicare wellness visits. These visits should occur yearly (can not be scheduled less than 1 calendar year apart) and cover preventive health, immunizations, advance directives and screenings you are entitled to yearly through your medicare benefits. Do not miss out on your entitled benefits, this is when medicare will pay for these benefits to be ordered for you.  These are strongly encouraged by your provider and is the appropriate type of visit to make certain you are up to date with all preventive health benefits. If you have not had your medicare wellness exam in the last 12 months, please make certain to schedule one by calling the office and schedule your medicare wellness with Maudie Mercury as soon as possible.   Yearly physical (well visit):  - Adults are recommended to be seen yearly for physicals. Check with your insurance and date of your last physical, most insurances require one calendar year between physicals.  Physicals include all preventive health topics, screenings, medical exam and labs that are appropriate for gender/age and history. You may have fasting labs needed at this visit. This is a well visit (not a sick visit), new problems should not be covered during this visit (see acute visit).  - Pediatric patients are seen more frequently when they are younger. Your provider will advise you on well child visit timing that is appropriate for your their age. - This is not a medicare wellness visit. Medicare wellness exams do not have an exam portion to the visit. Some medicare companies allow for a physical, some do not allow a yearly physical. If your medicare allows a yearly physical you can schedule the medicare wellness with our nurse Maudie Mercury and have your physical with your provider after, on the same day. Please check with insurance for your full benefits.   Late Policy/No Shows:  - all new patients should arrive 15-30 minutes earlier than appointment to allow [place] time  to  obtain all personal demographics,  insurance information  and for you to complete office paperwork. - All established patients should arrive 10-15 minutes earlier than appointment time to update all information and be checked in .  - In our best efforts to run on time, if you are late for your appointment you will be asked to either reschedule or if able, we will work you back into the schedule. There will be a wait time to work you back in the schedule,  depending on availability.  - If you are unable to make it to your appointment as scheduled, please call 24 hours ahead of time to allow [place] to fill the time slot with someone else who needs to be seen. If you do not cancel your appointment ahead of time, you may be charged a no show fee.

## 2017-07-05 NOTE — Progress Notes (Signed)
Alexandria Weber , 04-07-1966, 51 y.o., female MRN: [id number] Patient Care Team    Relationship Specialty Notifications Start End  Ma Hillock, DO PCP - General Family Medicine  07/27/15   Huel Cote, NP Nurse Practitioner Obstetrics and Gynecology  09/05/16     Chief Complaint  Patient presents with  . Hypertension  . Depression     Subjective:  Hypertension/HLD: Pt reports compliance with repeat 2.5 mg daily. Blood pressures ranges at home normal. Patient denies chest pain, shortness of breath or lower extremity edema.  BMP: 09/05/2016 within normal limits CBC: 09/05/2016 within normal limits Lipids: 09/05/2016 total cholesterol 22, HDL 68, LDL 139, triglycerides 75 Diet/Exercise: Does not routinely monitor RF: Hypertension, hyperlipidemia, family history heart disease and stroke  Anxiety:  Patient reports compliance with Lexapro 20 mg daily. She feels her anxiety is moderately controlled, but she is having worsening social phobia. She feels more nervous around crowds, does not want to leave the home is much. She used to enjoy going to church on Sundays, now she finds her whole body tensing. She had had acquired torticollis last year, and she feels this way secondary to her increase in anxiety and social tension. She had been on Zoloft in the past with horrible side effects. She was also on Effexor approximately 20 years ago which made her migraine headaches worse. She has been migraine free for a few decades. She is willing to try Effexor again.  Depression screen [facility] 2/9 07/05/2017 07/27/2015  Decreased Interest 0 0  Down, Depressed, Hopeless 0 0  PHQ - 2 Score 0 0  Altered sleeping 0 -  Tired, decreased energy 0 -  Change in appetite 0 -  Feeling bad or failure about yourself  0 -  Trouble concentrating 0 -  Moving slowly or fidgety/restless 2 -  Suicidal thoughts 0 -  PHQ-9 Score 2 -    Allergies  Allergen Reactions  . Penicillins Hives  . Sulfa Antibiotics  Hives   Social History   Tobacco Use  . Smoking status: Never Smoker  . Smokeless tobacco: Never Used  Substance Use Topics  . Alcohol use: No   Past Medical History:  Diagnosis Date  . Anxiety   . Depression   . Fibroadenoma of breast    H/O IN LEFT BREAST NO CHANGE PER [place]  . Heart murmur   . High cholesterol   . Hypertension   . Migraine headache   . PMS (premenstrual syndrome)    Past Surgical History:  Procedure Laterality Date  . CESAREAN SECTION     x2  . TONSILLECTOMY     Family History  Problem Relation Age of Onset  . Heart disease Mother   . Hearing loss Mother   . Heart disease Father   . Hypertension Father   . Stroke Father   . Arthritis Father   . Hypertension Maternal Grandfather   . Hypertension Paternal Grandfather   . Heart disease Paternal Grandfather   . Drug abuse Brother        committed suicide  . Diabetes Mellitus II Maternal Aunt   . Diabetes Mellitus II Paternal Aunt   . Heart disease Paternal Uncle   . Colon cancer Neg Hx   . Breast cancer Neg Hx    Allergies as of 07/05/2017      Reactions   Penicillins Hives   Sulfa Antibiotics Hives      Medication List        Accurate  as of 07/05/17 10:39 AM. Always use your most recent med list.          amLODipine 5 MG tablet Commonly known as:  NORVASC Take 1 tablet (5 mg total) by mouth daily. Needs office visit prior to anymore refills.   escitalopram 20 MG tablet Commonly known as:  LEXAPRO TAKE 1 TABLET (20 MG TOTAL) BY MOUTH DAILY. NEED OFFICE VISIT PRIOR TO ANYMORE REFILLS.       All past medical history, surgical history, allergies, family history, immunizations andmedications were updated in the EMR today and reviewed under the history and medication portions of their EMR.     ROS: Negative, with the exception of above mentioned in HPI   Objective:  BP 128/77 (BP Location: Right Arm, Patient Position: Sitting, Cuff Size: Normal)   Pulse 71   Temp 98 F (36.7 C)    Resp 20   Wt 152 lb 12 oz (69.3 kg)   SpO2 97%   BMI 25.42 kg/m  Body mass index is 25.42 kg/m. Gen: Afebrile. No acute distress. Nontoxic in appearance, well developed, well nourished. . Pleasant, quiet Caucasian female.  HENT: AT. [place].  MMM Eyes:Pupils Equal Round Reactive to light, Extraocular movements intact,  Conjunctiva without redness, discharge or icterus. CV: RRR , noa Chest: CTAB, no wheeze or crackles. Good air movement, normal resp effort.  Abd: Soft. NTND. BS present.  Neuro:  Normal gait. PERLA. EOMi. Alert. Oriented x3  Psych:  mildly anxious, otherwise Normal affect, dress and demeanor. Normal speech. Normal thought content and judgment.  No exam data present No results found. No results found for this or any previous visit (from the past 24 hour(s)).  Assessment/Plan: Alexandria Weber is a 51 y.o. female present for OV for  Essential hypertension/HLD/overweight - Stable today. Continue amlodipine 2.5 mg daily. Refills provided today. -Low-sodium diet. Exercise. - Follow-up 6 months  Anxiety - Patient is suffering from increased social anxiety. Discussed different options with her today which included had on therapy to her Lexapro or starting another medication. She was present with multiple different options, and decided to try Effexor again. This had given her worse headaches in the past, however she has had migraines for many years so she would like to try this medicine again. - Patient was offered referral to psychology, Dr. Gaynell Face, and she would like to consider this as an option if the medication doesn't work as desired. - Effexor taper prescribed. - Patient to follow-up in 4 weeks. If doing well medication will provide 90 day refills follow-up in 3 months and then every 6 months.   Reviewed expectations re: course of current medical issues.  Discussed self-management of symptoms.  Outlined signs and symptoms indicating need for more acute  intervention.  Patient verbalized understanding and all questions were answered.  Patient received an After-Visit Summary.    No orders of the defined types were placed in this encounter.    Note is dictated utilizing voice recognition software. Although note has been proof read prior to signing, occasional typographical errors still can be missed. If any questions arise, please do not hesitate to call for verification.   electronically signed by:  Howard Pouch, DO  [place]

## 2017-08-01 ENCOUNTER — Encounter: Payer: Self-pay | Admitting: *Deleted

## 2017-08-03 ENCOUNTER — Other Ambulatory Visit: Payer: Self-pay | Admitting: *Deleted

## 2017-08-03 ENCOUNTER — Other Ambulatory Visit: Payer: Self-pay | Admitting: Family Medicine

## 2017-08-03 MED ORDER — VENLAFAXINE HCL 37.5 MG PO TABS: ORAL_TABLET | ORAL | 0 refills | Status: DC

## 2017-08-03 NOTE — Telephone Encounter (Signed)
Copied from [place] [phone]. Topic: Quick Communication - See Telephone Encounter >> Aug 03, 2017  9:06 AM Ether Griffins B wrote: CRM for notification. See Telephone encounter for:  Pt needing refill on vanlafaxine. Pt has appt set up for 08/08/17 but will be out on 08/06/17 08/03/17.

## 2017-08-03 NOTE — Telephone Encounter (Signed)
Patient has scheduled follow up appt but will be out of venlafaxine 4 days prior to her appt sent in refill for 5 day supply.

## 2017-08-03 NOTE — Telephone Encounter (Signed)
5 day supply sent to patient pharmacy.

## 2017-08-08 ENCOUNTER — Ambulatory Visit (INDEPENDENT_AMBULATORY_CARE_PROVIDER_SITE_OTHER): Payer: 59 | Admitting: Family Medicine

## 2017-08-08 ENCOUNTER — Encounter: Payer: Self-pay | Admitting: Family Medicine

## 2017-08-08 VITALS — BP 115/72 | HR 87 | Temp 97.5°F | Wt 153.1 lb

## 2017-08-08 DIAGNOSIS — F419 Anxiety disorder, unspecified: Secondary | ICD-10-CM | POA: Diagnosis not present

## 2017-08-08 DIAGNOSIS — F4000 Agoraphobia, unspecified: Secondary | ICD-10-CM | POA: Diagnosis not present

## 2017-08-08 MED ORDER — VENLAFAXINE HCL 75 MG PO TABS: ORAL_TABLET | ORAL | 0 refills | Status: DC

## 2017-08-08 NOTE — Progress Notes (Signed)
Alexandria Weber , 01/15/1966, 52 y.o., female MRN: [id number] Patient Care Team    Relationship Specialty Notifications Start End  Ma Hillock, DO PCP - General Family Medicine  07/27/15   Huel Cote, NP Nurse Practitioner Obstetrics and Gynecology  09/05/16     Chief Complaint  Patient presents with  . Anxiety    follow up     Subjective:    Anxiety:  Patient presents for follow-up after discontinuing Lexapro and starting Effexor taper approximately 5 weeks ago. Patient has successfully tapered to Effexor 75 mg daily. She denies any side effects to this medication. She does feel like she could use a little bit more assistance with her discomfort around crowds. She has noticed the medication seemed to help with the social aspect of it and it's not so much of a social phobia now as a Agoraphobia. She is going a pressure or tightness when in crowds. She does feel like the medication is helpful so far. Prior note: Patient reports compliance with Lexapro 20 mg daily. She feels her anxiety is moderately controlled, but she is having worsening social phobia. She feels more nervous around crowds, does not want to leave the home is much. She used to enjoy going to church on Sundays, now she finds her whole body tensing. She had had acquired torticollis last year, and she feels this way secondary to her increase in anxiety and social tension. She had been on Zoloft in the past with horrible side effects. She was also on Effexor approximately 20 years ago which made her migraine headaches worse. She has been migraine free for a few decades. She is willing to try Effexor again.   Hypertension/HLD: (documentation purposes only) Pt reports compliance with repeat 2.5 mg daily. Blood pressures ranges at home normal. Patient denies chest pain, shortness of breath or lower extremity edema.  BMP: 09/05/2016 within normal limits CBC: 09/05/2016 within normal limits Lipids: 09/05/2016 total  cholesterol 22, HDL 68, LDL 139, triglycerides 75 Diet/Exercise: Does not routinely monitor RF: Hypertension, hyperlipidemia, family history heart disease and stroke Depression screen [facility] 2/9 08/08/2017 07/05/2017 07/27/2015  Decreased Interest 0 0 0  Down, Depressed, Hopeless 0 0 0  PHQ - 2 Score 0 0 0  Altered sleeping 0 0 -  Tired, decreased energy 0 0 -  Change in appetite 0 0 -  Feeling bad or failure about yourself  0 0 -  Trouble concentrating 0 0 -  Moving slowly or fidgety/restless 3 2 -  Suicidal thoughts 0 0 -  PHQ-9 Score 3 2 -  Difficult doing work/chores Somewhat difficult - -   GAD 7 : Generalized Anxiety Score 08/08/2017 07/05/2017  Nervous, Anxious, on Edge 3 2  Control/stop worrying 0 0  Worry too much - different things 0 0  Trouble relaxing 3 3  Restless 0 -  Easily annoyed or irritable 0 1  Afraid - awful might happen 0 0  Total GAD 7 Score 6 -  Anxiety Difficulty Not difficult at all Somewhat difficult     Allergies  Allergen Reactions  . Penicillins Hives  . Sulfa Antibiotics Hives  . Zoloft [Sertraline Hcl]    Social History   Tobacco Use  . Smoking status: Never Smoker  . Smokeless tobacco: Never Used  Substance Use Topics  . Alcohol use: No   Past Medical History:  Diagnosis Date  . Anxiety   . Depression   . Fibroadenoma of breast    H/O IN  LEFT BREAST NO CHANGE PER [place]  . Heart murmur   . High cholesterol   . Hypertension   . Migraine headache   . PMS (premenstrual syndrome)    Past Surgical History:  Procedure Laterality Date  . CESAREAN SECTION     x2  . TONSILLECTOMY     Family History  Problem Relation Age of Onset  . Heart disease Mother   . Hearing loss Mother   . Heart disease Father   . Hypertension Father   . Stroke Father   . Arthritis Father   . Hypertension Maternal Grandfather   . Hypertension Paternal Grandfather   . Heart disease Paternal Grandfather   . Drug abuse Brother        committed suicide  .  Diabetes Mellitus II Maternal Aunt   . Diabetes Mellitus II Paternal Aunt   . Heart disease Paternal Uncle   . Colon cancer Neg Hx   . Breast cancer Neg Hx    Allergies as of 08/08/2017      Reactions   Penicillins Hives   Sulfa Antibiotics Hives   Zoloft [sertraline Hcl]       Medication List        Accurate as of 08/08/17 10:17 AM. Always use your most recent med list.          amLODipine 2.5 MG tablet Commonly known as:  NORVASC Take 1 tablet (2.5 mg total) by mouth daily.   venlafaxine 37.5 MG tablet Commonly known as:  EFFEXOR 2 tabs by mouth daily       All past medical history, surgical history, allergies, family history, immunizations andmedications were updated in the EMR today and reviewed under the history and medication portions of their EMR.     ROS: Negative, with the exception of above mentioned in HPI   Objective:  BP 115/72 (BP Location: Left Arm, Patient Position: Sitting, Cuff Size: Normal)   Pulse 87   Temp (!) 97.5 F (36.4 C) (Oral)   Wt 153 lb 1.9 oz (69.5 kg)   SpO2 97%   BMI 25.48 kg/m  Body mass index is 25.48 kg/m. Gen: Afebrile. No acute distress. Nontoxic in appearance, well-developed, well-nourished, very pleasant Caucasian female. HENT: AT. [place].  MMM.  CV: RRR  Neuro:  Normal gait. PERLA. EOMi. Alert. Oriented x3  Psych: Mildly anxious, mild psychomotor agitation resident. Otherwise, Normal affect, dress and demeanor. Normal speech. Normal thought content and judgment.   No exam data present No results found. No results found for this or any previous visit (from the past 24 hour(s)).  Assessment/Plan: Manessa Buley is a 52 y.o. female present for OV for  Essential hypertension/HLD/overweight - Stable today. Continue amlodipine 2.5 mg daily. Refills provided today. -Low-sodium diet. Exercise. - Follow-up 6 months  Anxiety/Agoraphobia- mild social anxiety - Patient is tolerating Effexor in place of Lexapro. Continue  Effexor at 75 mg daily for another 4 weeks. If Patient does not feel dose is enough at that time, she can call in, we will increase her dose to 150 mg of Effexor daily and see her back in 4 weeks. If - if patient is doing well on 75 mg daily, follow-up in 3 months. - Effexor 75 mg daily, #90, 0 refills prescribed today. - Patient was again offered referral to psychology, Dr. Gaynell Face, and she would like to consider this as an option if the medication doesn't work as desired.    Reviewed expectations re: course of current medical issues.  Discussed  self-management of symptoms.  Outlined signs and symptoms indicating need for more acute intervention.  Patient verbalized understanding and all questions were answered.  Patient received an After-Visit Summary.    No orders of the defined types were placed in this encounter.    Note is dictated utilizing voice recognition software. Although note has been proof read prior to signing, occasional typographical errors still can be missed. If any questions arise, please do not hesitate to call for verification.   electronically signed by:  Howard Pouch, DO  [place]

## 2017-08-08 NOTE — Patient Instructions (Signed)
Continue effexor 75 mg a day. New pill is 75 mg, take one a day.  I have called in 3 month refill. If doing well on this dose follow up in 3 months. If feeling you need more coverage call in 5 weeks, we will increase and see you back sooner.     Agoraphobia Agoraphobia is a mental health disorder. It is a type of anxiety or fear. People with agoraphobia fear public places where they may be trapped, helpless, or embarrassed in the event of a panic attack or a loss of control. They often start to avoid the feared situations or insist that another person go with them. Agoraphobia may interfere with normal daily activities and personal relationships. People with severe agoraphobia may become completely homebound and dependent on others for grocery shopping and other errands. Agoraphobia usually begins before age 62, but it can start in the older adult years. People with agoraphobia are at risk for other anxiety disorders, depression, and substance abuse. What are the causes? It is not known exactly what causes agoraphobia. What increases the risk? Agoraphobia is more common in women. People who have panic disorder or have family members with agoraphobia are at higher risk of developing agoraphobia. What are the signs or symptoms? You may have agoraphobia if you have the following symptoms for 6 months or longer:  Intense fear about two or more of the following: ? Using public transportation, such as cars, buses, planes, trains, or ships. ? Being in open spaces, such as parking lots, shopping malls, or bridges. ? Being in enclosed spaces, such as shops, theaters, or elevators. ? Standing in line or being in a crowd. ? Being outside the home alone.  Fear that is due to thoughts of being unable to escape or get help if certain events occur. The feared event may be a panic attack or panic-like symptoms, such as a racing heart, dizziness, and trouble breathing. In older people, the feared event may be a  fall or loss of bowel control.  Reacting to feared situations by: ? Avoiding them. ? Requiring the presence of a companion. ? Enduring them with intense fear or anxiety.  Fear or anxiety that is out of proportion to the actual danger that is posed by the event and the situation.  How is this diagnosed? Agoraphobia may be diagnosed by your health care provider. You will be asked questions about your fears and how they have affected you. You may be asked about your medical history and your use of medicines, alcohol, or drugs. Your health care provider may do a physical exam and order lab tests or other studies to rule out a medical condition. You may also be referred to a mental health specialist. How is this treated? Treatment usually includes a combination of counseling and medicines.  Counseling or talk therapy. Talk therapy is provided by mental health specialists. The following forms of talk therapy can be especially helpful: ? Cognitive therapy. Cognitive therapy helps you to recognize and change unrealistic thoughts and beliefs that contribute to your fears. ? Exposure therapy. Exposure therapy helps you to face and overcome your fears in a relaxed state and a safe environment.  Medicines. The following types of medicines may be helpful: ? Antidepressants. Antidepressants are believed to affect certain chemicals in your brain. They can decrease general levels of anxiety and can help to prevent panic attacks. ? Benzodiazepines. These medicines block feelings of anxiety and panic. They are very effective and act more quickly  than antidepressants, but they are highly addictive. These medicines are recommended only for short-term use. ? Beta blockers. Beta blockers can reduce physical symptoms of anxiety, such as a racing heart, sweating, and tremor. They may help you to feel less tense and anxious.  Follow these instructions at home:  Keep all follow-up visits as directed by your health  care provider. This is important.  Take all medicines only as directed by your health care provider.  Try to exercise, eat a healthy diet, and get plenty of sleep.  Do not drink alcohol.  Do not use illegal drugs. Where to find more information: For more information, visit the website of the Anxiety and Depression Association of [place] (ADAA): https://www.clark.net/ Contact a health care provider if:  Your fear or anxiety gets worse.  You have new fears or anxieties. Get help right away if:  You have serious thoughts about hurting yourself or someone else.  You have trouble breathing or have chest pain. This information is not intended to replace advice given to you by your health care provider. Make sure you discuss any questions you have with your health care provider. Document Released: 12/14/2010 Document Revised: 12/30/2015 Document Reviewed: 11/24/2013 Elsevier Interactive Patient Education  [employer].

## 2017-09-14 ENCOUNTER — Telehealth: Payer: Self-pay | Admitting: Family Medicine

## 2017-09-14 NOTE — Telephone Encounter (Signed)
Patient last seen 08/08/17 . Patient would like to increase effexor. Please advise.

## 2017-09-14 NOTE — Telephone Encounter (Signed)
Please call pt: She can increase her dose by taking 2 tabs daily of the 75 mg pill (she should have enough to do this without a new script). Follow up in 4 weeks.

## 2017-09-14 NOTE — Telephone Encounter (Signed)
Copied from [place]. Topic: General - Other >> Sep 14, 2017  1:06 PM Neva Seat wrote: Pt wants to up the dosage of Effexor 75 mg as pt discussed with Dr. Raoul Pitch. Please call pt back to discuss.

## 2017-09-14 NOTE — Telephone Encounter (Signed)
Spoke with patient reviewed instructions patient verbalized understanding. she will check her schedule and call back to set up office visit.

## 2017-10-10 ENCOUNTER — Encounter: Payer: Self-pay | Admitting: Family Medicine

## 2017-10-10 ENCOUNTER — Ambulatory Visit (INDEPENDENT_AMBULATORY_CARE_PROVIDER_SITE_OTHER): Payer: 59 | Admitting: Family Medicine

## 2017-10-10 VITALS — BP 104/71 | HR 89 | Temp 98.2°F | Ht 65.0 in | Wt 153.4 lb

## 2017-10-10 DIAGNOSIS — I10 Essential (primary) hypertension: Secondary | ICD-10-CM

## 2017-10-10 DIAGNOSIS — F419 Anxiety disorder, unspecified: Secondary | ICD-10-CM | POA: Diagnosis not present

## 2017-10-10 MED ORDER — AMLODIPINE BESYLATE 2.5 MG PO TABS: 2.5000 mg | ORAL_TABLET | Freq: Every day | ORAL | 1 refills | Status: DC

## 2017-10-10 MED ORDER — VENLAFAXINE HCL ER 150 MG PO CP24: 150.0000 mg | ORAL_CAPSULE | Freq: Every day | ORAL | 1 refills | Status: DC

## 2017-10-10 NOTE — Patient Instructions (Signed)
It was nice to see you today. I am glad you are doing better.  I have called in the effexor 150 mg dose, take one a day in the morning.  Followup in 6 months, sooner if needed.   Please help [place] help you:  We are honored you have chosen [facility] for your Primary Care home. Below you will find basic instructions that you may need to access in the future. Please help [place] help you by reading the instructions, which cover many of the frequent questions we experience.   Prescription refills and request:  -In order to allow more efficient response time, please call your pharmacy for all refills. They will forward the request electronically to [place]. This allows for the quickest possible response. Request left on a nurse line can take longer to refill, since these are checked as time allows between office patients and other phone calls.  - refill request can take up to 3-5 working days to complete.  - If request is sent electronically and request is appropiate, it is usually completed in 1-2 business days.  - all patients will need to be seen routinely for all chronic medical conditions requiring prescription medications (see follow-up below). If you are overdue for follow up on your condition, you will be asked to make an appointment and we will call in enough medication to cover you until your appointment (up to 30 days).  - all controlled substances will require a face to face visit to request/refill.  - if you desire your prescriptions to go through a new pharmacy, and have an active script at original pharmacy, you will need to call your pharmacy and have scripts transferred to new pharmacy. This is completed between the pharmacy locations and not by your provider.    Results: If any images or labs were ordered, it can take up to 1 week to get results depending on the test ordered and the lab/facility running and resulting the test. - Normal or stable results, which do not need further discussion,  may be released to your mychart immediately with attached note to you. A call may not be generated for normal results. Please make certain to sign up for mychart. If you have questions on how to activate your mychart you can call the front office.  - If your results need further discussion, our office will attempt to contact you via phone, and if unable to reach you after 2 attempts, we will release your abnormal result to your mychart with instructions.  - All results will be automatically released in mychart after 1 week.  - Your provider will provide you with explanation and instruction on all relevant material in your results. Please keep in mind, results and labs may appear confusing or abnormal to the untrained eye, but it does not mean they are actually abnormal for you personally. If you have any questions about your results that are not covered, or you desire more detailed explanation than what was provided, you should make an appointment with your provider to do so.   Our office handles many outgoing and incoming calls daily. If we have not contacted you within 1 week about your results, please check your mychart to see if there is a message first and if not, then contact our office.  In helping with this matter, you help decrease call volume, and therefore allow [place] to be able to respond to patients needs more efficiently.   Acute office visits (sick visit):  An acute visit is intended for a new problem and are scheduled in shorter time slots to allow schedule openings for patients with new problems. This is the appropriate visit to discuss a new problem. In order to provide you with excellent quality medical care with proper time for you to explain your problem, have an exam and receive treatment with instructions, these appointments should be limited to one new problem per visit. If you experience a new problem, in which you desire to be addressed, please make an acute office visit, we save  openings on the schedule to accommodate you. Please do not save your new problem for any other type of visit, let us take care of it properly and quickly for you.   Follow up visits:  Depending on your condition(s) your provider will need to see you routinely in order to provide you with quality care and prescribe medication(s). Most chronic conditions (Example: hypertension, Diabetes, depression/anxiety... etc), require visits a couple times a year. Your provider will instruct you on proper follow up for your personal medical conditions and history. Please make certain to make follow up appointments for your condition as instructed. Failing to do so could result in lapse in your medication treatment/refills. If you request a refill, and are overdue to be seen on a condition, we will always provide you with a 30 day script (once) to allow you time to schedule.    Medicare wellness (well visit): - we have a wonderful Nurse Maudie Mercury), that will meet with you and provide you will yearly medicare wellness visits. These visits should occur yearly (can not be scheduled less than 1 calendar year apart) and cover preventive health, immunizations, advance directives and screenings you are entitled to yearly through your medicare benefits. Do not miss out on your entitled benefits, this is when medicare will pay for these benefits to be ordered for you.  These are strongly encouraged by your provider and is the appropriate type of visit to make certain you are up to date with all preventive health benefits. If you have not had your medicare wellness exam in the last 12 months, please make certain to schedule one by calling the office and schedule your medicare wellness with Maudie Mercury as soon as possible.   Yearly physical (well visit):  - Adults are recommended to be seen yearly for physicals. Check with your insurance and date of your last physical, most insurances require one calendar year between physicals. Physicals  include all preventive health topics, screenings, medical exam and labs that are appropriate for gender/age and history. You may have fasting labs needed at this visit. This is a well visit (not a sick visit), new problems should not be covered during this visit (see acute visit).  - Pediatric patients are seen more frequently when they are younger. Your provider will advise you on well child visit timing that is appropriate for your their age. - This is not a medicare wellness visit. Medicare wellness exams do not have an exam portion to the visit. Some medicare companies allow for a physical, some do not allow a yearly physical. If your medicare allows a yearly physical you can schedule the medicare wellness with our nurse Maudie Mercury and have your physical with your provider after, on the same day. Please check with insurance for your full benefits.   Late Policy/No Shows:  - all new patients should arrive 15-30 minutes earlier than appointment to allow [place] time  to  obtain all personal demographics,  insurance information and for you to complete office paperwork. - All established patients should arrive 10-15 minutes earlier than appointment time to update all information and be checked in .  - In our best efforts to run on time, if you are late for your appointment you will be asked to either reschedule or if able, we will work you back into the schedule. There will be a wait time to work you back in the schedule,  depending on availability.  - If you are unable to make it to your appointment as scheduled, please call 24 hours ahead of time to allow [place] to fill the time slot with someone else who needs to be seen. If you do not cancel your appointment ahead of time, you may be charged a no show fee.

## 2017-10-10 NOTE — Progress Notes (Signed)
Alexandria Weber , [birth year], 52 y.o., female MRN: [id number] Patient Care Team    Relationship Specialty Notifications Start End  Ma Hillock, DO PCP - General Family Medicine  07/27/15   Huel Cote, NP Nurse Practitioner Obstetrics and Gynecology  09/05/16     Chief Complaint  Patient presents with  . Medication Refill    EFFEXOR     Subjective:    Anxiety:  Doing well on increased dose of effexor 150 mg qd. No side effects. She is still uncomfortable in social situations but feel the anxiety level is decreased.  Patient presents for follow-up after discontinuing Lexapro and starting Effexor taper approximately 5 weeks ago. Patient has successfully tapered to Effexor 75 mg daily. She denies any side effects to this medication. She does feel like she could use a little bit more assistance with her discomfort around crowds. She has noticed the medication seemed to help with the social aspect of it and it's not so much of a social phobia now as a Agoraphobia. She is going a pressure or tightness when in crowds. She does feel like the medication is helpful so far. Prior note: Patient reports compliance with Lexapro 20 mg daily. She feels her anxiety is moderately controlled, but she is having worsening social phobia. She feels more nervous around crowds, does not want to leave the home is much. She used to enjoy going to church on Sundays, now she finds her whole body tensing. She had had acquired torticollis last year, and she feels this way secondary to her increase in anxiety and social tension. She had been on Zoloft in the past with horrible side effects. She was also on Effexor approximately 20 years ago which made her migraine headaches worse. She has been migraine free for a few decades. She is willing to try Effexor again.   Hypertension/HLD:  Pt reports compliance with amlodipine 2.5 mg daily. Blood pressures ranges at home normal. Patient denies chest pain, shortness of  breath, dizziness or lower extremity edema.  BMP: 09/05/2016 within normal limits CBC: 09/05/2016 within normal limits Lipids: 09/05/2016 total cholesterol 22, HDL 68, LDL 139, triglycerides 75 Diet/Exercise: Does not routinely monitor RF: Hypertension, hyperlipidemia, family history heart disease and stroke Depression screen [facility] 2/9 10/10/2017 10/10/2017 08/08/2017 07/05/2017 07/27/2015  Decreased Interest 0 0 0 0 0  Down, Depressed, Hopeless 0 0 0 0 0  PHQ - 2 Score 0 0 0 0 0  Altered sleeping 0 - 0 0 -  Tired, decreased energy 0 - 0 0 -  Change in appetite 0 - 0 0 -  Feeling bad or failure about yourself  0 - 0 0 -  Trouble concentrating 0 - 0 0 -  Moving slowly or fidgety/restless 0 - 3 2 -  Suicidal thoughts 0 - 0 0 -  PHQ-9 Score 0 - 3 2 -  Difficult doing work/chores Not difficult at all - Somewhat difficult - -   GAD 7 : Generalized Anxiety Score 10/10/2017 08/08/2017 07/05/2017  Nervous, Anxious, on Edge 1 3 2   Control/stop worrying 0 0 0  Worry too much - different things 0 0 0  Trouble relaxing 0 3 3  Restless 0 0 -  Easily annoyed or irritable 0 0 1  Afraid - awful might happen 0 0 0  Total GAD 7 Score 1 6 -  Anxiety Difficulty Not difficult at all Not difficult at all Somewhat difficult     Allergies  Allergen Reactions  .  Penicillins Hives  . Sulfa Antibiotics Hives  . Zoloft [Sertraline Hcl]    Social History   Tobacco Use  . Smoking status: Never Smoker  . Smokeless tobacco: Never Used  Substance Use Topics  . Alcohol use: No   Past Medical History:  Diagnosis Date  . Anxiety   . Depression   . Fibroadenoma of breast    H/O IN LEFT BREAST NO CHANGE PER [place]  . Heart murmur   . High cholesterol   . Hypertension   . Migraine headache   . PMS (premenstrual syndrome)    Past Surgical History:  Procedure Laterality Date  . CESAREAN SECTION     x2  . TONSILLECTOMY     Family History  Problem Relation Age of Onset  . Heart disease Mother   . Hearing  loss Mother   . Heart disease Father   . Hypertension Father   . Stroke Father   . Arthritis Father   . Hypertension Maternal Grandfather   . Hypertension Paternal Grandfather   . Heart disease Paternal Grandfather   . Drug abuse Brother        committed suicide  . Diabetes Mellitus II Maternal Aunt   . Diabetes Mellitus II Paternal Aunt   . Heart disease Paternal Uncle   . Colon cancer Neg Hx   . Breast cancer Neg Hx    Allergies as of 10/10/2017      Reactions   Penicillins Hives   Sulfa Antibiotics Hives   Zoloft [sertraline Hcl]       Medication List        Accurate as of 10/10/17  8:17 AM. Always use your most recent med list.          amLODipine 2.5 MG tablet Commonly known as:  NORVASC Take 1 tablet (2.5 mg total) by mouth daily.   venlafaxine 75 MG tablet Commonly known as:  EFFEXOR 2 tabs by mouth daily       All past medical history, surgical history, allergies, family history, immunizations andmedications were updated in the EMR today and reviewed under the history and medication portions of their EMR.     ROS: Negative, with the exception of above mentioned in HPI   Objective:  BP 104/71 (BP Location: Left Arm, Patient Position: Sitting, Cuff Size: Large)   Pulse 89   Temp 98.2 F (36.8 C) (Oral)   Ht 5\' 5"  (1.651 m)   Wt 153 lb 6.4 oz (69.6 kg)   LMP 09/07/2017   SpO2 95%   BMI 25.53 kg/m  Body mass index is 25.53 kg/m. Gen: Afebrile. No acute distress.  HENT: AT. [place]. MMM.  Eyes:Pupils Equal Round Reactive to light, Extraocular movements intact,  Conjunctiva without redness, discharge or icterus CV: RRR no murmur, no edema, +2/4 P posterior tibialis pulses Chest: CTAB, no wheeze or crackles Abd: Soft. NTND. BS present. no Masses palpated.  Neuro:  Normal gait. PERLA. EOMi. Alert. Oriented x3 Psych: Normal affect, dress and demeanor. Normal speech. Normal thought content and judgment.   No exam data present No results found. No results  found for this or any previous visit (from the past 24 hour(s)).  Assessment/Plan: Alexandria Weber is a 52 y.o. female present for OV for  Essential hypertension/HLD/overweight - Stable today. Continue amlodipine 2.5 mg daily. Refills provided today. -Low-sodium diet. Exercise. - Follow-up 6 months  Anxiety/mild social anxiety - Tolerating effexor 150 mg QD. Increased dose 4 weeks ago. - Patient was again offered referral  to psychology, Dr. Gaynell Face, and she would like to consider this as an option if the medication doesn't work as desired. - refills provided today, f/u 6 mos, sooner if needed    Reviewed expectations re: course of current medical issues.  Discussed self-management of symptoms.  Outlined signs and symptoms indicating need for more acute intervention.  Patient verbalized understanding and all questions were answered.  Patient received an After-Visit Summary.    No orders of the defined types were placed in this encounter.    Note is dictated utilizing voice recognition software. Although note has been proof read prior to signing, occasional typographical errors still can be missed. If any questions arise, please do not hesitate to call for verification.   electronically signed by:  Howard Pouch, DO  [place]

## 2017-12-17 ENCOUNTER — Telehealth: Payer: Self-pay | Admitting: Family Medicine

## 2017-12-17 NOTE — Telephone Encounter (Signed)
Copied from [place] [id number]. Topic: Quick Communication - See Telephone Encounter >> Dec 17, 2017  1:48 PM Rutherford Nail, [place] wrote: CRM for notification. See Telephone encounter for: 12/17/17. Patient calling and states that she saw Dr Raoul Pitch and wanted to know if the medication that was discussed at the patient's last visit could be sent to the pharmacy. When asked what the medication was, patient stated that Dr Raoul Pitch would have that information. Please advise CB#: [phone]  [id number] - [place], [place] - [address] [place] [address] [place] [address]

## 2017-12-17 NOTE — Telephone Encounter (Signed)
We briefly discussed 2 different medications at her last appt, on eof which is a controlled substance. She would need an appt to discuss each and counseled on use/side effects.

## 2017-12-18 NOTE — Telephone Encounter (Signed)
Scheduled patient to be seen for evaluation and treatment.

## 2017-12-20 ENCOUNTER — Ambulatory Visit (INDEPENDENT_AMBULATORY_CARE_PROVIDER_SITE_OTHER): Payer: 59 | Admitting: Family Medicine

## 2017-12-20 ENCOUNTER — Encounter: Payer: Self-pay | Admitting: Family Medicine

## 2017-12-20 VITALS — BP 117/78 | HR 90 | Temp 98.3°F | Resp 20 | Ht 65.0 in | Wt 150.0 lb

## 2017-12-20 DIAGNOSIS — F419 Anxiety disorder, unspecified: Secondary | ICD-10-CM

## 2017-12-20 DIAGNOSIS — M436 Torticollis: Secondary | ICD-10-CM

## 2017-12-20 MED ORDER — DIAZEPAM 5 MG PO TABS: 2.5000 mg | ORAL_TABLET | Freq: Two times a day (BID) | ORAL | 0 refills | Status: DC | PRN

## 2017-12-20 NOTE — Progress Notes (Signed)
Alexandria Weber , [birth year], 52 y.o., female MRN: [id number] Patient Care Team    Relationship Specialty Notifications Start End  Ma Hillock, DO PCP - General Family Medicine  07/27/15   Huel Cote, NP Nurse Practitioner Obstetrics and Gynecology  09/05/16     Chief Complaint  Patient presents with  . Anxiety     Subjective:  Anxiety:  Pt presents today to discuss her medications for anxiety. She reports the effexor 150 mg works well for her for her baseline anxiety. She however still has right sided neck pain frequently (had torticollis). Her family has noticed when she is in a social situation her shoulders tense up. She reports her right neck pain will then occur. She experiences this at work a few times a week and at [employer]. She does not necessarily feel higher anxiety at these times, but once attention was brought the action, she realized it does occur frequently and associated with the neck pain occurring.   Prior note 10/10/2017:  Doing well on increased dose of effexor 150 mg qd. No side effects. She is still uncomfortable in social situations but feel the anxiety level is decreased.  Patient presents for follow-up after discontinuing Lexapro and starting Effexor taper approximately 5 weeks ago. Patient has successfully tapered to Effexor 75 mg daily. She denies any side effects to this medication. She does feel like she could use a little bit more assistance with her discomfort around crowds. She has noticed the medication seemed to help with the social aspect of it and it's not so much of a social phobia now as a Agoraphobia. She is going a pressure or tightness when in crowds. She does feel like the medication is helpful so far. Prior note: Patient reports compliance with Lexapro 20 mg daily. She feels her anxiety is moderately controlled, but she is having worsening social phobia. She feels more nervous around crowds, does not want to leave the home is much. She used to  enjoy going to church on Sundays, now she finds her whole body tensing. She had had acquired torticollis last year, and she feels this way secondary to her increase in anxiety and social tension. She had been on Zoloft in the past with horrible side effects. She was also on Effexor approximately 20 years ago which made her migraine headaches worse. She has been migraine free for a few decades. She is willing to try Effexor again.    Depression screen [facility] 2/9 10/10/2017 10/10/2017 08/08/2017 07/05/2017 07/27/2015  Decreased Interest 0 0 0 0 0  Down, Depressed, Hopeless 0 0 0 0 0  PHQ - 2 Score 0 0 0 0 0  Altered sleeping 0 - 0 0 -  Tired, decreased energy 0 - 0 0 -  Change in appetite 0 - 0 0 -  Feeling bad or failure about yourself  0 - 0 0 -  Trouble concentrating 0 - 0 0 -  Moving slowly or fidgety/restless 0 - 3 2 -  Suicidal thoughts 0 - 0 0 -  PHQ-9 Score 0 - 3 2 -  Difficult doing work/chores Not difficult at all - Somewhat difficult - -   GAD 7 : Generalized Anxiety Score 12/20/2017 10/10/2017 08/08/2017 07/05/2017  Nervous, Anxious, on Edge 3 1 3 2   Control/stop worrying 0 0 0 0  Worry too much - different things 0 0 0 0  Trouble relaxing 1 0 3 3  Restless 1 0 0 -  Easily annoyed or  irritable 0 0 0 1  Afraid - awful might happen 0 0 0 0  Total GAD 7 Score 5 1 6  -  Anxiety Difficulty - Not difficult at all Not difficult at all Somewhat difficult     Allergies  Allergen Reactions  . Penicillins Hives  . Sulfa Antibiotics Hives  . Zoloft [Sertraline Hcl]    Social History   Tobacco Use  . Smoking status: Never Smoker  . Smokeless tobacco: Never Used  Substance Use Topics  . Alcohol use: No   Past Medical History:  Diagnosis Date  . Anxiety   . Depression   . Fibroadenoma of breast    H/O IN LEFT BREAST NO CHANGE PER [place]  . Heart murmur   . High cholesterol   . Hypertension   . Migraine headache   . PMS (premenstrual syndrome)    Past Surgical History:  Procedure  Laterality Date  . CESAREAN SECTION     x2  . TONSILLECTOMY     Family History  Problem Relation Age of Onset  . Heart disease Mother   . Hearing loss Mother   . Heart disease Father   . Hypertension Father   . Stroke Father   . Arthritis Father   . Hypertension Maternal Grandfather   . Hypertension Paternal Grandfather   . Heart disease Paternal Grandfather   . Drug abuse Brother        committed suicide  . Diabetes Mellitus II Maternal Aunt   . Diabetes Mellitus II Paternal Aunt   . Heart disease Paternal Uncle   . Colon cancer Neg Hx   . Breast cancer Neg Hx    Allergies as of 12/20/2017      Reactions   Penicillins Hives   Sulfa Antibiotics Hives   Zoloft [sertraline Hcl]       Medication List        Accurate as of 12/20/17  8:11 AM. Always use your most recent med list.          amLODipine 2.5 MG tablet Commonly known as:  NORVASC Take 1 tablet (2.5 mg total) by mouth daily.   venlafaxine XR 150 MG 24 hr capsule Commonly known as:  EFFEXOR-XR Take 1 capsule (150 mg total) by mouth daily with breakfast.       All past medical history, surgical history, allergies, family history, immunizations andmedications were updated in the EMR today and reviewed under the history and medication portions of their EMR.     ROS: Negative, with the exception of above mentioned in HPI   Objective:  BP 117/78 (BP Location: Right Arm, Patient Position: Sitting, Cuff Size: Normal)   Pulse 90   Temp 98.3 F (36.8 C)   Resp 20   Ht 5\' 5"  (1.651 m)   Wt 150 lb (68 kg)   SpO2 97%   BMI 24.96 kg/m  Body mass index is 24.96 kg/m. Gen: Afebrile. No acute distress. nontoxic in appearance, well developed, well nourished. Very pleasant caucasian female. Rubbing right side of neck.  HENT: AT. [place]. MMM.  Eyes:Pupils Equal Round Reactive to light, Extraocular movements intact,  Conjunctiva without redness, discharge or icterus. Neuro:  Normal gait. PERLA. EOMi. Alert. Orientedx3   Psych: Normal affect, dress and demeanor. Normal speech. Normal thought content and judgment.   No exam data present No results found. No results found for this or any previous visit (from the past 24 hour(s)).  Assessment/Plan: Alexandria Weber is a 52 y.o. female present for  OV for  Anxiety/mild social anxiety/torticollis - Continue Effexor 150 mg QD. - lengthy discussion surrounding potential benefits/risks of multiple meds today. Both agreed she may find most benefit with low dose valium.  - valium 2.5-5 mg BID PRN. She was cautioned on  potential sedating properties, control substance, addiction etc.  - NCCS database reviewed today. Controlled substance contract signed 12/20/2017 - Patient was again offered referral to psychology, Dr. Gaynell Face, and she would like to consider this as an option if the medication doesn't work as desired. -F/U 1 month    Reviewed expectations re: course of current medical issues.  Discussed self-management of symptoms.  Outlined signs and symptoms indicating need for more acute intervention.  Patient verbalized understanding and all questions were answered.  Patient received an After-Visit Summary.    No orders of the defined types were placed in this encounter.    Note is dictated utilizing voice recognition software. Although note has been proof read prior to signing, occasional typographical errors still can be missed. If any questions arise, please do not hesitate to call for verification.   electronically signed by:  Howard Pouch, DO  [place]

## 2017-12-20 NOTE — Patient Instructions (Signed)
Statr valium 1/2 tab to 1 tab up to every 12 hours as need F/u 30 days and we will provide 6 months script on what dose you ended on if it is working

## 2018-01-18 ENCOUNTER — Encounter: Payer: Self-pay | Admitting: Family Medicine

## 2018-01-18 ENCOUNTER — Ambulatory Visit (INDEPENDENT_AMBULATORY_CARE_PROVIDER_SITE_OTHER): Payer: 59 | Admitting: Family Medicine

## 2018-01-18 VITALS — BP 110/75 | HR 92 | Temp 98.1°F | Resp 20 | Ht 65.0 in | Wt 151.0 lb

## 2018-01-18 DIAGNOSIS — I10 Essential (primary) hypertension: Secondary | ICD-10-CM | POA: Diagnosis not present

## 2018-01-18 DIAGNOSIS — E663 Overweight: Secondary | ICD-10-CM

## 2018-01-18 DIAGNOSIS — F419 Anxiety disorder, unspecified: Secondary | ICD-10-CM

## 2018-01-18 DIAGNOSIS — F4000 Agoraphobia, unspecified: Secondary | ICD-10-CM

## 2018-01-18 DIAGNOSIS — M436 Torticollis: Secondary | ICD-10-CM

## 2018-01-18 MED ORDER — VENLAFAXINE HCL ER 150 MG PO CP24: 150.0000 mg | ORAL_CAPSULE | Freq: Every day | ORAL | 1 refills | Status: DC

## 2018-01-18 MED ORDER — DIAZEPAM 5 MG PO TABS: 5.0000 mg | ORAL_TABLET | Freq: Two times a day (BID) | ORAL | 5 refills | Status: DC | PRN

## 2018-01-18 MED ORDER — AMLODIPINE BESYLATE 2.5 MG PO TABS: 2.5000 mg | ORAL_TABLET | Freq: Every day | ORAL | 1 refills | Status: DC

## 2018-01-18 NOTE — Progress Notes (Signed)
Alexandria Weber , 1966-06-11, 52 y.o., female MRN: [id number] Patient Care Team    Relationship Specialty Notifications Start End  Ma Hillock, DO PCP - General Family Medicine  07/27/15   Huel Cote, NP Nurse Practitioner Obstetrics and Gynecology  09/05/16     Chief Complaint  Patient presents with  . Anxiety     Subjective:  Hypertension/HLD:  Pt reports compliance with amlodipine 2.5 mg daily. Blood pressures ranges at home normal. Patient denies chest pain, shortness of breath, dizziness or lower extremity edema.  BMP: 09/05/2016 within normal limits CBC: 09/05/2016 within normal limits Lipids: 09/05/2016 total cholesterol 22, HDL 68, LDL 139, triglycerides 75 Diet/Exercise: Does not routinely monitor RF: Hypertension, hyperlipidemia, family history heart disease and stroke  Anxiety:  Much improved with neck discomfort/toricollis and anxiety wit hthe addition of valium to her effexor 150 mg QD regimen. Pt would like continue.   Prior note:  Pt presents today to discuss her medications for anxiety. She reports the effexor 150 mg works well for her for her baseline anxiety. She however still has right sided neck pain frequently (had torticollis). Her family has noticed when she is in a social situation her shoulders tense up. She reports her right neck pain will then occur. She experiences this at work a few times a week and at [employer]. She does not necessarily feel higher anxiety at these times, but once attention was brought the action, she realized it does occur frequently and associated with the neck pain occurring.   Prior note 10/10/2017:  Doing well on increased dose of effexor 150 mg qd. No side effects. She is still uncomfortable in social situations but feel the anxiety level is decreased.  Patient presents for follow-up after discontinuing Lexapro and starting Effexor taper approximately 5 weeks ago. Patient has successfully tapered to Effexor 75 mg daily. She  denies any side effects to this medication. She does feel like she could use a little bit more assistance with her discomfort around crowds. She has noticed the medication seemed to help with the social aspect of it and it's not so much of a social phobia now as a Agoraphobia. She is going a pressure or tightness when in crowds. She does feel like the medication is helpful so far. Prior note: Patient reports compliance with Lexapro 20 mg daily. She feels her anxiety is moderately controlled, but she is having worsening social phobia. She feels more nervous around crowds, does not want to leave the home is much. She used to enjoy going to church on Sundays, now she finds her whole body tensing. She had had acquired torticollis last year, and she feels this way secondary to her increase in anxiety and social tension. She had been on Zoloft in the past with horrible side effects. She was also on Effexor approximately 20 years ago which made her migraine headaches worse. She has been migraine free for a few decades. She is willing to try Effexor again.    Depression screen [facility] 2/9 12/20/2017 10/10/2017 10/10/2017 08/08/2017 07/05/2017  Decreased Interest 0 0 0 0 0  Down, Depressed, Hopeless 0 0 0 0 0  PHQ - 2 Score 0 0 0 0 0  Altered sleeping - 0 - 0 0  Tired, decreased energy - 0 - 0 0  Change in appetite - 0 - 0 0  Feeling bad or failure about yourself  - 0 - 0 0  Trouble concentrating - 0 - 0 0  Moving  slowly or fidgety/restless - 0 - 3 2  Suicidal thoughts - 0 - 0 0  PHQ-9 Score - 0 - 3 2  Difficult doing work/chores - Not difficult at all - Somewhat difficult -   GAD 7 : Generalized Anxiety Score 01/18/2018 12/20/2017 10/10/2017 08/08/2017  Nervous, Anxious, on Edge 1 3 1 3   Control/stop worrying 0 0 0 0  Worry too much - different things 0 0 0 0  Trouble relaxing 0 1 0 3  Restless 1 1 0 0  Easily annoyed or irritable 0 0 0 0  Afraid - awful might happen 0 0 0 0  Total GAD 7 Score 2 5 1 6   Anxiety  Difficulty - - Not difficult at all Not difficult at all     Allergies  Allergen Reactions  . Penicillins Hives  . Sulfa Antibiotics Hives  . Zoloft [Sertraline Hcl]    Social History   Tobacco Use  . Smoking status: Never Smoker  . Smokeless tobacco: Never Used  Substance Use Topics  . Alcohol use: No   Past Medical History:  Diagnosis Date  . Anxiety   . Depression   . Fibroadenoma of breast    H/O IN LEFT BREAST NO CHANGE PER [place]  . Heart murmur   . High cholesterol   . Hypertension   . Migraine headache   . PMS (premenstrual syndrome)    Past Surgical History:  Procedure Laterality Date  . CESAREAN SECTION     x2  . TONSILLECTOMY     Family History  Problem Relation Age of Onset  . Heart disease Mother   . Hearing loss Mother   . Heart disease Father   . Hypertension Father   . Stroke Father   . Arthritis Father   . Hypertension Maternal Grandfather   . Hypertension Paternal Grandfather   . Heart disease Paternal Grandfather   . Drug abuse Brother        committed suicide  . Diabetes Mellitus II Maternal Aunt   . Diabetes Mellitus II Paternal Aunt   . Heart disease Paternal Uncle   . Colon cancer Neg Hx   . Breast cancer Neg Hx    Allergies as of 01/18/2018      Reactions   Penicillins Hives   Sulfa Antibiotics Hives   Zoloft [sertraline Hcl]       Medication List        Accurate as of 01/18/18  8:16 AM. Always use your most recent med list.          amLODipine 2.5 MG tablet Commonly known as:  NORVASC Take 1 tablet (2.5 mg total) by mouth daily.   diazepam 5 MG tablet Commonly known as:  VALIUM Take 0.5-1 tablets (2.5-5 mg total) by mouth every 12 (twelve) hours as needed for anxiety.   venlafaxine XR 150 MG 24 hr capsule Commonly known as:  EFFEXOR-XR Take 1 capsule (150 mg total) by mouth daily with breakfast.       All past medical history, surgical history, allergies, family history, immunizations andmedications were updated  in the EMR today and reviewed under the history and medication portions of their EMR.     ROS: Negative, with the exception of above mentioned in HPI   Objective:  BP 110/75 (BP Location: Right Arm, Patient Position: Sitting, Cuff Size: Normal)   Pulse 92   Temp 98.1 F (36.7 C)   Resp 20   Ht 5\' 5"  (1.651 m)   Wt  151 lb (68.5 kg)   SpO2 97%   BMI 25.13 kg/m  Body mass index is 25.13 kg/m. Gen: Afebrile. No acute distress. pleasant caucasian female.  HENT: AT. [place].  MMM.  Eyes:Pupils Equal Round Reactive to light, Extraocular movements intact,  Conjunctiva without redness, discharge or icterus. Neck/lymp/endocrine: Supple, more relaxed and no torticollis today CV: RRR no murmur, no edema, +2/4 P posterior tibialis pulses Chest: CTAB, no wheeze or crackles Abd: Soft. NTND. BS present. no Masses palpated.  Neuro:  Normal gait. PERLA. EOMi. Alert. Oriented x3 Psych: Normal affect, dress and demeanor. Normal speech. Normal thought content and judgment.  No exam data present No results found. No results found for this or any previous visit (from the past 24 hour(s)).  Assessment/Plan: Shaneisha Burkel is a 52 y.o. female present for OV for  Anxiety/mild social anxiety/torticollis - Continue Effexor 150 mg QD. refiils provided today.  - continue valium 5 mg BID PRN, but routinely taking QD.  - lengthy discussion surrounding potential benefits/risks of multiple meds today. Both agreed she may find most benefit with low dose valium.  - She was cautioned on  potential sedating properties, control substance, addiction etc.  - NCCS database reviewed 01/18/18 - Controlled substance contract signed 12/20/2017 - Patient was again offered referral to psychology, Dr. Gaynell Face, and she would like to consider this as an option if the medication doesn't work as desired. -F/U 6 months  HTN: - stable.  - low sodium. Diet and exercise. - Continue/refills on amlodipine 2.5 mg QD.  - F/u 6  mos    Reviewed expectations re: course of current medical issues.  Discussed self-management of symptoms.  Outlined signs and symptoms indicating need for more acute intervention.  Patient verbalized understanding and all questions were answered.  Patient received an After-Visit Summary.    No orders of the defined types were placed in this encounter.    Note is dictated utilizing voice recognition software. Although note has been proof read prior to signing, occasional typographical errors still can be missed. If any questions arise, please do not hesitate to call for verification.   electronically signed by:  Howard Pouch, DO  [place]

## 2018-01-18 NOTE — Patient Instructions (Signed)
I have refilled your meds today.  You  Look really good. I am glad you are feeling better.  Follow up 6 months  Please help [place] help you:  We are honored you have chosen [facility] for your Primary Care home. Below you will find basic instructions that you may need to access in the future. Please help [place] help you by reading the instructions, which cover many of the frequent questions we experience.   Prescription refills and request:  -In order to allow more efficient response time, please call your pharmacy for all refills. They will forward the request electronically to [place]. This allows for the quickest possible response. Request left on a nurse line can take longer to refill, since these are checked as time allows between office patients and other phone calls.  - refill request can take up to 3-5 working days to complete.  - If request is sent electronically and request is appropiate, it is usually completed in 1-2 business days.  - all patients will need to be seen routinely for all chronic medical conditions requiring prescription medications (see follow-up below). If you are overdue for follow up on your condition, you will be asked to make an appointment and we will call in enough medication to cover you until your appointment (up to 30 days).  - all controlled substances will require a face to face visit to request/refill.  - if you desire your prescriptions to go through a new pharmacy, and have an active script at original pharmacy, you will need to call your pharmacy and have scripts transferred to new pharmacy. This is completed between the pharmacy locations and not by your provider.    Results: If any images or labs were ordered, it can take up to 1 week to get results depending on the test ordered and the lab/facility running and resulting the test. - Normal or stable results, which do not need further discussion, may be released to your mychart immediately with attached note to  you. A call may not be generated for normal results. Please make certain to sign up for mychart. If you have questions on how to activate your mychart you can call the front office.  - If your results need further discussion, our office will attempt to contact you via phone, and if unable to reach you after 2 attempts, we will release your abnormal result to your mychart with instructions.  - All results will be automatically released in mychart after 1 week.  - Your provider will provide you with explanation and instruction on all relevant material in your results. Please keep in mind, results and labs may appear confusing or abnormal to the untrained eye, but it does not mean they are actually abnormal for you personally. If you have any questions about your results that are not covered, or you desire more detailed explanation than what was provided, you should make an appointment with your provider to do so.   Our office handles many outgoing and incoming calls daily. If we have not contacted you within 1 week about your results, please check your mychart to see if there is a message first and if not, then contact our office.  In helping with this matter, you help decrease call volume, and therefore allow [place] to be able to respond to patients needs more efficiently.   Acute office visits (sick visit):  An acute visit is intended for a new problem and are scheduled in shorter time slots  to allow schedule openings for patients with new problems. This is the appropriate visit to discuss a new problem. Problems will not be addressed by phone call or Echart message. Appointment is needed if requesting treatment. In order to provide you with excellent quality medical care with proper time for you to explain your problem, have an exam and receive treatment with instructions, these appointments should be limited to one new problem per visit. If you experience a new problem, in which you desire to be addressed,  please make an acute office visit, we save openings on the schedule to accommodate you. Please do not save your new problem for any other type of visit, let us take care of it properly and quickly for you.   Follow up visits:  Depending on your condition(s) your provider will need to see you routinely in order to provide you with quality care and prescribe medication(s). Most chronic conditions (Example: hypertension, Diabetes, depression/anxiety... etc), require visits a couple times a year. Your provider will instruct you on proper follow up for your personal medical conditions and history. Please make certain to make follow up appointments for your condition as instructed. Failing to do so could result in lapse in your medication treatment/refills. If you request a refill, and are overdue to be seen on a condition, we will always provide you with a 30 day script (once) to allow you time to schedule.    Medicare wellness (well visit): - we have a wonderful Nurse Maudie Mercury), that will meet with you and provide you will yearly medicare wellness visits. These visits should occur yearly (can not be scheduled less than 1 calendar year apart) and cover preventive health, immunizations, advance directives and screenings you are entitled to yearly through your medicare benefits. Do not miss out on your entitled benefits, this is when medicare will pay for these benefits to be ordered for you.  These are strongly encouraged by your provider and is the appropriate type of visit to make certain you are up to date with all preventive health benefits. If you have not had your medicare wellness exam in the last 12 months, please make certain to schedule one by calling the office and schedule your medicare wellness with Maudie Mercury as soon as possible.   Yearly physical (well visit):  - Adults are recommended to be seen yearly for physicals. Check with your insurance and date of your last physical, most insurances require one  calendar year between physicals. Physicals include all preventive health topics, screenings, medical exam and labs that are appropriate for gender/age and history. You may have fasting labs needed at this visit. This is a well visit (not a sick visit), new problems should not be covered during this visit (see acute visit).  - Pediatric patients are seen more frequently when they are younger. Your provider will advise you on well child visit timing that is appropriate for your their age. - This is not a medicare wellness visit. Medicare wellness exams do not have an exam portion to the visit. Some medicare companies allow for a physical, some do not allow a yearly physical. If your medicare allows a yearly physical you can schedule the medicare wellness with our nurse Maudie Mercury and have your physical with your provider after, on the same day. Please check with insurance for your full benefits.   Late Policy/No Shows:  - all new patients should arrive 15-30 minutes earlier than appointment to allow [place] time  to  obtain all personal demographics,  insurance information and for you to complete office paperwork. - All established patients should arrive 10-15 minutes earlier than appointment time to update all information and be checked in .  - In our best efforts to run on time, if you are late for your appointment you will be asked to either reschedule or if able, we will work you back into the schedule. There will be a wait time to work you back in the schedule,  depending on availability.  - If you are unable to make it to your appointment as scheduled, please call 24 hours ahead of time to allow [place] to fill the time slot with someone else who needs to be seen. If you do not cancel your appointment ahead of time, you may be charged a no show fee.

## 2018-05-31 ENCOUNTER — Other Ambulatory Visit: Payer: Self-pay | Admitting: Family Medicine

## 2018-05-31 DIAGNOSIS — Z1231 Encounter for screening mammogram for malignant neoplasm of breast: Secondary | ICD-10-CM

## 2018-06-04 ENCOUNTER — Encounter: Payer: 59 | Admitting: Family Medicine

## 2018-06-06 ENCOUNTER — Ambulatory Visit
Admission: RE | Admit: 2018-06-06 | Discharge: 2018-06-06 | Disposition: A | Discharge status: Home / Self Care | Payer: 59 | Source: Ambulatory Visit | Attending: Family Medicine | Admitting: Family Medicine

## 2018-06-06 ENCOUNTER — Other Ambulatory Visit: Payer: Self-pay | Admitting: Family Medicine

## 2018-06-06 DIAGNOSIS — Z1231 Encounter for screening mammogram for malignant neoplasm of breast: Secondary | ICD-10-CM

## 2018-06-12 ENCOUNTER — Ambulatory Visit (INDEPENDENT_AMBULATORY_CARE_PROVIDER_SITE_OTHER): Payer: 59 | Admitting: Family Medicine

## 2018-06-12 ENCOUNTER — Encounter: Payer: Self-pay | Admitting: Family Medicine

## 2018-06-12 VITALS — BP 133/85 | HR 79 | Temp 98.4°F | Resp 16 | Ht 65.0 in | Wt 146.0 lb

## 2018-06-12 DIAGNOSIS — F419 Anxiety disorder, unspecified: Secondary | ICD-10-CM | POA: Diagnosis not present

## 2018-06-12 DIAGNOSIS — Z23 Encounter for immunization: Secondary | ICD-10-CM | POA: Diagnosis not present

## 2018-06-12 DIAGNOSIS — Z0000 Encounter for general adult medical examination without abnormal findings: Secondary | ICD-10-CM | POA: Diagnosis not present

## 2018-06-12 DIAGNOSIS — Z131 Encounter for screening for diabetes mellitus: Secondary | ICD-10-CM

## 2018-06-12 DIAGNOSIS — E782 Mixed hyperlipidemia: Secondary | ICD-10-CM | POA: Diagnosis not present

## 2018-06-12 DIAGNOSIS — I10 Essential (primary) hypertension: Secondary | ICD-10-CM | POA: Diagnosis not present

## 2018-06-12 DIAGNOSIS — F4000 Agoraphobia, unspecified: Secondary | ICD-10-CM

## 2018-06-12 DIAGNOSIS — M436 Torticollis: Secondary | ICD-10-CM

## 2018-06-12 DIAGNOSIS — Z1211 Encounter for screening for malignant neoplasm of colon: Secondary | ICD-10-CM

## 2018-06-12 LAB — CBC WITH DIFFERENTIAL/PLATELET
Basophils Absolute: 0.1 10*3/uL (ref 0.0–0.1)
Basophils Relative: 2.3 % (ref 0.0–3.0)
Eosinophils Absolute: 0.2 10*3/uL (ref 0.0–0.7)
Eosinophils Relative: 4.1 % (ref 0.0–5.0)
HCT: 44.9 % (ref 36.0–46.0)
Hemoglobin: 15.3 g/dL — ABNORMAL HIGH (ref 12.0–15.0)
Lymphocytes Relative: 38 % (ref 12.0–46.0)
Lymphs Abs: 1.8 10*3/uL (ref 0.7–4.0)
MCHC: 34.2 g/dL (ref 30.0–36.0)
MCV: 92.4 fl (ref 78.0–100.0)
Monocytes Absolute: 0.3 10*3/uL (ref 0.1–1.0)
Monocytes Relative: 5.7 % (ref 3.0–12.0)
Neutro Abs: 2.4 10*3/uL (ref 1.4–7.7)
Neutrophils Relative %: 49.9 % (ref 43.0–77.0)
Platelets: 321 10*3/uL (ref 150.0–400.0)
RBC: 4.86 Mil/uL (ref 3.87–5.11)
RDW: 12.6 % (ref 11.5–15.5)
WBC: 4.8 10*3/uL (ref 4.0–10.5)

## 2018-06-12 LAB — COMPREHENSIVE METABOLIC PANEL
ALT: 19 U/L (ref 0–35)
AST: 25 U/L (ref 0–37)
Albumin: 4.8 g/dL (ref 3.5–5.2)
Alkaline Phosphatase: 108 U/L (ref 39–117)
BUN: 13 mg/dL (ref 6–23)
CO2: 33 mEq/L — ABNORMAL HIGH (ref 19–32)
Calcium: 9.2 mg/dL (ref 8.4–10.5)
Chloride: 101 mEq/L (ref 96–112)
Creatinine, Ser: 0.71 mg/dL (ref 0.40–1.20)
GFR: 92.48 mL/min (ref >60.00)
Glucose, Bld: 86 mg/dL (ref 70–99)
Potassium: 3.7 mEq/L (ref 3.5–5.1)
Sodium: 142 mEq/L (ref 135–145)
Total Bilirubin: 0.7 mg/dL (ref 0.2–1.2)
Total Protein: 7.2 g/dL (ref 6.0–8.3)

## 2018-06-12 LAB — TSH: TSH: 1.2 u[IU]/mL (ref 0.35–4.50)

## 2018-06-12 LAB — LIPID PANEL
Cholesterol: 227 mg/dL — ABNORMAL HIGH (ref 0–200)
HDL: 70.2 mg/dL (ref >39.00)
LDL Cholesterol: 143 mg/dL — ABNORMAL HIGH (ref 0–99)
NonHDL: 157.24
Total CHOL/HDL Ratio: 3
Triglycerides: 73 mg/dL (ref 0.0–149.0)
VLDL: 14.6 mg/dL (ref 0.0–40.0)

## 2018-06-12 LAB — HEMOGLOBIN A1C: Hgb A1c MFr Bld: 5.4 % (ref 4.6–6.5)

## 2018-06-12 MED ORDER — AMLODIPINE BESYLATE 2.5 MG PO TABS: 2.5000 mg | ORAL_TABLET | Freq: Every day | ORAL | 1 refills | Status: DC

## 2018-06-12 MED ORDER — DIAZEPAM 5 MG PO TABS: 5.0000 mg | ORAL_TABLET | Freq: Two times a day (BID) | ORAL | 5 refills | Status: DC | PRN

## 2018-06-12 MED ORDER — VENLAFAXINE HCL ER 150 MG PO CP24: 150.0000 mg | ORAL_CAPSULE | Freq: Every day | ORAL | 1 refills | Status: DC

## 2018-06-12 NOTE — Patient Instructions (Addendum)
Follow ups:  1 year physcial 6 mos [facility] w/ provider 3 mos nurse visit for shingrix 2 Refilled all meds today.      Health Maintenance, Female Adopting a healthy lifestyle and getting preventive care can go a long way to promote health and wellness. Talk with your health care provider about what schedule of regular examinations is right for you. This is a good chance for you to check in with your provider about disease prevention and staying healthy. In between checkups, there are plenty of things you can do on your own. Experts have done a lot of research about which lifestyle changes and preventive measures are most likely to keep you healthy. Ask your health care provider for more information. Weight and diet Eat a healthy diet  Be sure to include plenty of vegetables, fruits, low-fat dairy products, and lean protein.  Do not eat a lot of foods high in solid fats, added sugars, or salt.  Get regular exercise. This is one of the most important things you can do for your health. ? Most adults should exercise for at least 150 minutes each week. The exercise should increase your heart rate and make you sweat (moderate-intensity exercise). ? Most adults should also do strengthening exercises at least twice a week. This is in addition to the moderate-intensity exercise.  Maintain a healthy weight  Body mass index (BMI) is a measurement that can be used to identify possible weight problems. It estimates body fat based on height and weight. Your health care provider can help determine your BMI and help you achieve or maintain a healthy weight.  For females 46 years of age and older: ? A BMI below 18.5 is considered underweight. ? A BMI of 18.5 to 24.9 is normal. ? A BMI of 25 to 29.9 is considered overweight. ? A BMI of 30 and above is considered obese.  Watch levels of cholesterol and blood lipids  You should start having your blood tested for lipids and cholesterol at 50 years of age, then  have this test every 5 years.  You may need to have your cholesterol levels checked more often if: ? Your lipid or cholesterol levels are high. ? You are older than 50 years of age. ? You are at high risk for heart disease.  Cancer screening Lung Cancer  Lung cancer screening is recommended for adults 48-44 years old who are at high risk for lung cancer because of a history of smoking.  A yearly low-dose CT scan of the lungs is recommended for people who: ? Currently smoke. ? Have quit within the past 15 years. ? Have at least a 30-pack-year history of smoking. A pack year is smoking an average of one pack of cigarettes a day for 1 year.  Yearly screening should continue until it has been 15 years since you quit.  Yearly screening should stop if you develop a health problem that would prevent you from having lung cancer treatment.  Breast Cancer  Practice breast self-awareness. This means understanding how your breasts normally appear and feel.  It also means doing regular breast self-exams. Let your health care provider know about any changes, no matter how small.  If you are in your 20s or 30s, you should have a clinical breast exam (CBE) by a health care provider every 1-3 years as part of a regular health exam.  If you are 28 or older, have a CBE every year. Also consider having a breast X-ray (mammogram) every  year.  If you have a family history of breast cancer, talk to your health care provider about genetic screening.  If you are at high risk for breast cancer, talk to your health care provider about having an MRI and a mammogram every year.  Breast cancer gene (BRCA) assessment is recommended for women who have family members with BRCA-related cancers. BRCA-related cancers include: ? Breast. ? Ovarian. ? Tubal. ? Peritoneal cancers.  Results of the assessment will determine the need for genetic counseling and BRCA1 and BRCA2 testing.  Cervical Cancer Your health  care provider may recommend that you be screened regularly for cancer of the pelvic organs (ovaries, uterus, and vagina). This screening involves a pelvic examination, including checking for microscopic changes to the surface of your cervix (Pap test). You may be encouraged to have this screening done every 3 years, beginning at age 62.  For women ages 15-65, health care providers may recommend pelvic exams and Pap testing every 3 years, or they may recommend the Pap and pelvic exam, combined with testing for human papilloma virus (HPV), every 5 years. Some types of HPV increase your risk of cervical cancer. Testing for HPV may also be done on women of any age with unclear Pap test results.  Other health care providers may not recommend any screening for nonpregnant women who are considered low risk for pelvic cancer and who do not have symptoms. Ask your health care provider if a screening pelvic exam is right for you.  If you have had past treatment for cervical cancer or a condition that could lead to cancer, you need Pap tests and screening for cancer for at least 20 years after your treatment. If Pap tests have been discontinued, your risk factors (such as having a new sexual partner) need to be reassessed to determine if screening should resume. Some women have medical problems that increase the chance of getting cervical cancer. In these cases, your health care provider may recommend more frequent screening and Pap tests.  Colorectal Cancer  This type of cancer can be detected and often prevented.  Routine colorectal cancer screening usually begins at 50 years of age and continues through 50 years of age.  Your health care provider may recommend screening at an earlier age if you have risk factors for colon cancer.  Your health care provider may also recommend using home test kits to check for hidden blood in the stool.  A small camera at the end of a tube can be used to examine your colon  directly (sigmoidoscopy or colonoscopy). This is done to check for the earliest forms of colorectal cancer.  Routine screening usually begins at age 51.  Direct examination of the colon should be repeated every 5-10 years through 50 years of age. However, you may need to be screened more often if early forms of precancerous polyps or small growths are found.  Skin Cancer  Check your skin from head to toe regularly.  Tell your health care provider about any new moles or changes in moles, especially if there is a change in a mole's shape or color.  Also tell your health care provider if you have a mole that is larger than the size of a pencil eraser.  Always use sunscreen. Apply sunscreen liberally and repeatedly throughout the day.  Protect yourself by wearing long sleeves, pants, a wide-brimmed hat, and sunglasses whenever you are outside.  Heart disease, diabetes, and high blood pressure  High blood pressure causes heart  disease and increases the risk of stroke. High blood pressure is more likely to develop in: ? People who have blood pressure in the high end of the normal range (130-139/85-89 mm Hg). ? People who are overweight or obese. ? People who are African American.  If you are 77-49 years of age, have your blood pressure checked every 3-5 years. If you are 62 years of age or older, have your blood pressure checked every year. You should have your blood pressure measured twice-once when you are at a hospital or clinic, and once when you are not at a hospital or clinic. Record the average of the two measurements. To check your blood pressure when you are not at a hospital or clinic, you can use: ? An automated blood pressure machine at a pharmacy. ? A home blood pressure monitor.  If you are between 14 years and 65 years old, ask your health care provider if you should take aspirin to prevent strokes.  Have regular diabetes screenings. This involves taking a blood sample to  check your fasting blood sugar level. ? If you are at a normal weight and have a low risk for diabetes, have this test once every three years after 50 years of age. ? If you are overweight and have a high risk for diabetes, consider being tested at a younger age or more often. Preventing infection Hepatitis B  If you have a higher risk for hepatitis B, you should be screened for this virus. You are considered at high risk for hepatitis B if: ? You were born in a country where hepatitis B is common. Ask your health care provider which countries are considered high risk. ? Your parents were born in a high-risk country, and you have not been immunized against hepatitis B (hepatitis B vaccine). ? You have HIV or AIDS. ? You use needles to inject street drugs. ? You live with someone who has hepatitis B. ? You have had sex with someone who has hepatitis B. ? You get hemodialysis treatment. ? You take certain medicines for conditions, including cancer, organ transplantation, and autoimmune conditions.  Hepatitis C  Blood testing is recommended for: ? Everyone born from [birth year] through 1963. ? Anyone with known risk factors for hepatitis C.  Sexually transmitted infections (STIs)  You should be screened for sexually transmitted infections (STIs) including gonorrhea and chlamydia if: ? You are sexually active and are younger than 49 years of age. ? You are older than 50 years of age and your health care provider tells you that you are at risk for this type of infection. ? Your sexual activity has changed since you were last screened and you are at an increased risk for chlamydia or gonorrhea. Ask your health care provider if you are at risk.  If you do not have HIV, but are at risk, it may be recommended that you take a prescription medicine daily to prevent HIV infection. This is called pre-exposure prophylaxis (PrEP). You are considered at risk if: ? You are sexually active and do not regularly  use condoms or know the HIV status of your partner(s). ? You take drugs by injection. ? You are sexually active with a partner who has HIV.  Talk with your health care provider about whether you are at high risk of being infected with HIV. If you choose to begin PrEP, you should first be tested for HIV. You should then be tested every 3 months for as long as you  are taking PrEP. Pregnancy  If you are premenopausal and you may become pregnant, ask your health care provider about preconception counseling.  If you may become pregnant, take 400 to 800 micrograms (mcg) of folic acid every day.  If you want to prevent pregnancy, talk to your health care provider about birth control (contraception). Osteoporosis and menopause  Osteoporosis is a disease in which the bones lose minerals and strength with aging. This can result in serious bone fractures. Your risk for osteoporosis can be identified using a bone density scan.  If you are 75 years of age or older, or if you are at risk for osteoporosis and fractures, ask your health care provider if you should be screened.  Ask your health care provider whether you should take a calcium or vitamin D supplement to lower your risk for osteoporosis.  Menopause may have certain physical symptoms and risks.  Hormone replacement therapy may reduce some of these symptoms and risks. Talk to your health care provider about whether hormone replacement therapy is right for you. Follow these instructions at home:  Schedule regular health, dental, and eye exams.  Stay current with your immunizations.  Do not use any tobacco products including cigarettes, chewing tobacco, or electronic cigarettes.  If you are pregnant, do not drink alcohol.  If you are breastfeeding, limit how much and how often you drink alcohol.  Limit alcohol intake to no more than 1 drink per day for nonpregnant women. One drink equals 12 ounces of beer, 5 ounces of wine, or 1 ounces  of hard liquor.  Do not use street drugs.  Do not share needles.  Ask your health care provider for help if you need support or information about quitting drugs.  Tell your health care provider if you often feel depressed.  Tell your health care provider if you have ever been abused or do not feel safe at home. This information is not intended to replace advice given to you by your health care provider. Make sure you discuss any questions you have with your health care provider. Document Released: 02/06/2011 Document Revised: 12/30/2015 Document Reviewed: 04/27/2015 Elsevier Interactive Patient Education  [employer].

## 2018-06-12 NOTE — Progress Notes (Signed)
Patient ID: Alexandria Weber, female  DOB: Dec 31, 1965, 52 y.o.   MRN: [id number] Patient Care Team    Relationship Specialty Notifications Start End  Ma Hillock, DO PCP - General Family Medicine  07/27/15   Huel Cote, NP Nurse Practitioner Obstetrics and Gynecology  09/05/16     Chief Complaint  Patient presents with  . Annual Exam    Subjective:  Alexandria Weber is a 52 y.o.  Female  present for CPE . All past medical history, surgical history, allergies, family history, immunizations, medications and social history were updated in the electronic medical record today. All recent labs, ED visits and hospitalizations within the last year were reviewed.  Essential hypertension/HLD Pt reports compliance with amlodipine 2.5 mg QD- she did not take it today bc she fasted.  Blood pressures ranges at home normal. Patient denies chest pain, shortness of breath or lower extremity edema.   Torticollis, acquired/Agoraphobia/Anxiety Pt reports she is doing well. Compliance with effexor and valium. She sometimes feels like she could use a little more valium coverage in some social situations, She typically on takes one of her prescribed BID PRN doses daily.   Health maintenance:  Colonoscopy: no fhx, screen 50--> referred to [facility]  Mammogram: completed:09/06/2017, birads 1. No fhx.. Cervical cancer screening: last pap: 09/2016, results: WNL, completed by: Dr. Elon Alas Immunizations: tdap 01/2016, Influenza declined- she will get at [facility] (encouraged yearly). shingrix #1 provided today.  Infectious disease screening: HIV completed DEXA: N/A Assistive device: none Oxygen GYK:ZLDJ Patient has a Dental home. Hospitalizations/ED visits: reviewed  Depression screen [facility] 2/9 06/12/2018 12/20/2017 10/10/2017 10/10/2017 08/08/2017  Decreased Interest 0 0 0 0 0  Down, Depressed, Hopeless 0 0 0 0 0  PHQ - 2 Score 0 0 0 0 0  Altered sleeping 0 - 0 - 0  Tired, decreased energy 0 - 0 - 0    Change in appetite 0 - 0 - 0  Feeling bad or failure about yourself  0 - 0 - 0  Trouble concentrating 0 - 0 - 0  Moving slowly or fidgety/restless 1 - 0 - 3  Suicidal thoughts 0 - 0 - 0  PHQ-9 Score 1 - 0 - 3  Difficult doing work/chores Not difficult at all - Not difficult at all - Somewhat difficult   GAD 7 : Generalized Anxiety Score 01/18/2018 12/20/2017 10/10/2017 08/08/2017  Nervous, Anxious, on Edge 1 3 1 3   Control/stop worrying 0 0 0 0  Worry too much - different things 0 0 0 0  Trouble relaxing 0 1 0 3  Restless 1 1 0 0  Easily annoyed or irritable 0 0 0 0  Afraid - awful might happen 0 0 0 0  Total GAD 7 Score 2 5 1 6   Anxiety Difficulty - - Not difficult at all Not difficult at all     Current Exercise Habits: The patient does not participate in regular exercise at present Exercise limited by: None identified   Immunization History  Administered Date(s) Administered  . Influenza,inj,Quad PF,6+ Mos 07/27/2015, 09/05/2016  . Influenza-Unspecified 06/17/2013, 06/17/2014, 06/05/2017  . Tdap 01/25/2016  . Zoster Recombinat (Shingrix) 06/12/2018     Past Medical History:  Diagnosis Date  . Anxiety   . Depression   . Fibroadenoma of breast    H/O IN LEFT BREAST NO CHANGE PER [place]  . Heart murmur   . High cholesterol   . Hypertension   . Migraine headache   .  PMS (premenstrual syndrome)    Allergies  Allergen Reactions  . Penicillins Hives  . Sulfa Antibiotics Hives  . Zoloft [Sertraline Hcl]    Past Surgical History:  Procedure Laterality Date  . CESAREAN SECTION     x2  . TONSILLECTOMY     Family History  Problem Relation Age of Onset  . Heart disease Mother   . Hearing loss Mother   . Heart disease Father   . Hypertension Father   . Stroke Father   . Arthritis Father   . Hypertension Maternal Grandfather   . Hypertension Paternal Grandfather   . Heart disease Paternal Grandfather   . Drug abuse Brother        committed suicide  . Diabetes  Mellitus II Maternal Aunt   . Diabetes Mellitus II Paternal Aunt   . Heart disease Paternal Uncle   . Colon cancer Neg Hx   . Breast cancer Neg Hx    Social History   Socioeconomic History  . Marital status: Married    Spouse name: Not on file  . Number of children: Not on file  . Years of education: Not on file  . Highest education level: Not on file  Occupational History  . Not on file  Social Needs  . Financial resource strain: Not on file  . Food insecurity:    Worry: Not on file    Inability: Not on file  . Transportation needs:    Medical: Not on file    Non-medical: Not on file  Tobacco Use  . Smoking status: Never Smoker  . Smokeless tobacco: Never Used  Substance and Sexual Activity  . Alcohol use: No  . Drug use: No  . Sexual activity: Yes    Birth control/protection: None  Lifestyle  . Physical activity:    Days per week: Not on file    Minutes per session: Not on file  . Stress: Not on file  Relationships  . Social connections:    Talks on phone: Not on file    Gets together: Not on file    Attends religious service: Not on file    Active member of club or organization: Not on file    Attends meetings of clubs or organizations: Not on file    Relationship status: Not on file  . Intimate partner violence:    Fear of current or ex partner: Not on file    Emotionally abused: Not on file    Physically abused: Not on file    Forced sexual activity: Not on file  Other Topics Concern  . Not on file  Social History Narrative   Married. Spouse is name Suezanne Jacquet. 3 children.   High school graduate. Full-time employed. 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   Drinks caffeinated beverages, no tobacco, no alcohol, no recreational drugs.   Wears her seatbelt. Smoke detectors located in the home. Firearms located in the home in a locked case.   Feels safe in her relationship.   Allergies as of 06/12/2018      Reactions   Penicillins Hives   Sulfa Antibiotics Hives    Zoloft [sertraline Hcl]       Medication List        Accurate as of 06/12/18 12:56 PM. Always use your most recent med list.          amLODipine 2.5 MG tablet Commonly known as:  NORVASC Take 1 tablet (2.5 mg total) by mouth daily.   diazepam 5 MG  tablet Commonly known as:  VALIUM Take 1 tablet (5 mg total) by mouth every 12 (twelve) hours as needed for anxiety.   venlafaxine XR 150 MG 24 hr capsule Commonly known as:  EFFEXOR-XR Take 1 capsule (150 mg total) by mouth daily with breakfast.       All past medical history, surgical history, allergies, family history, immunizations andmedications were updated in the EMR today and reviewed under the history and medication portions of their EMR.     No results found for this or any previous visit (from the past 2160 hour(s)).  Mm Digital Screening Bilateral  Result Date: 06/07/2018 CLINICAL DATA:  Screening. EXAM: DIGITAL SCREENING BILATERAL MAMMOGRAM WITH CAD COMPARISON:  Previous exam(s). ACR Breast Density Category b: There are scattered areas of fibroglandular density. FINDINGS: There are no findings suspicious for malignancy. Images were processed with CAD. IMPRESSION: No mammographic evidence of malignancy. A result letter of this screening mammogram will be mailed directly to the patient. RECOMMENDATION: Screening mammogram in one year. (Code:SM-B-01Y) BI-RADS CATEGORY  1: Negative. Electronically Signed   By: Ammie Ferrier M.D.   On: 06/07/2018 11:47    ROS: 14 pt review of systems performed and negative (unless mentioned in an HPI)  Objective: BP 133/85 (BP Location: Right Arm, Patient Position: Sitting, Cuff Size: Normal)   Pulse 79   Temp 98.4 F (36.9 C) (Oral)   Resp 16   Ht 5\' 5"  (1.651 m)   Wt 146 lb (66.2 kg)   SpO2 99%   BMI 24.30 kg/m  Gen: Afebrile. No acute distress. Nontoxic in appearance, well-developed, well-nourished,  Pleasant caucasian female.  HENT: AT. [place]. Bilateral TM visualized and normal  in appearance, normal external auditory canal. MMM, no oral lesions, adequate dentition. Bilateral nares within normal limits. Throat without erythema, ulcerations or exudates. no Cough on exam, no hoarseness on exam. Eyes:Pupils Equal Round Reactive to light, Extraocular movements intact,  Conjunctiva without redness, discharge or icterus. Neck/lymp/endocrine: Supple,no lymphadenopathy, no thyromegaly CV: RRR no murmur, no edema, +2/4 P posterior tibialis pulses. no carotid bruits. No JVD. Chest: CTAB, no wheeze, rhonchi or crackles. normal Respiratory effort. good Air movement. Abd: Soft. flat. NTND. BS present. no Masses palpated. No hepatosplenomegaly. No rebound tenderness or guarding. Skin: no rashes, purpura or petechiae. Warm and well-perfused. Skin intact. Neuro/Msk:  Normal gait. PERLA. EOMi. Alert. Oriented x3.  Cranial nerves II through XII intact. Muscle strength 5/5 upper/lower extremity. Mild torticollis present. Mild head tremor present. DTRs equal bilaterally. Psych: Normal affect, dress and demeanor. Normal speech. Normal thought content and judgment.   No exam data present  Assessment/plan: Ifeoma Vallin is a 52 y.o. female present for CPE. Essential hypertension Stable. She did not take meds today.  - CBC with Differential/Platelet - Comprehensive metabolic panel - amLODipine (NORVASC) 2.5 MG tablet; Take 1 tablet (2.5 mg total) by mouth daily.  Dispense: 90 tablet; Refill: 1 - 6 mos Mixed hyperlipidemia - Lipid panel - TSH Diabetes mellitus screening - Hemoglobin A1c Torticollis, acquired - stable.  - diazepam (VALIUM) 5 MG tablet; Take 1 tablet (5 mg total) by mouth every 12 (twelve) hours as needed for anxiety.  Dispense: 60 tablet; Refill: 5 Agoraphobia/Anxiety - stable. Told she could take valium 7.5-10 Qd if desired if needing more coverage.  - [facility] database reviewed 06/12/18  - refills provided on meds today. - venlafaxine XR (EFFEXOR-XR) 150 MG 24 hr  capsule; Take 1 capsule (150 mg total) by mouth daily with breakfast.  Dispense: 90  capsule; Refill: 1 - diazepam (VALIUM) 5 MG tablet; Take 1 tablet (5 mg total) by mouth every 12 (twelve) hours as needed for anxiety.  Dispense: 60 tablet; Refill: 5 6 mos Colon cancer screening - Ambulatory referral to Gastroenterology Encounter for preventive health examination Patient was encouraged to exercise greater than 150 minutes a week. Patient was encouraged to choose a diet filled with fresh fruits and vegetables, and lean meats. AVS provided to patient today for education/recommendation on gender specific health and safety maintenance. Colonoscopy: no fhx, screen 50--> [facility] to [facility]  Mammogram: completed:09/06/2017, birads 1. No fhx.. Cervical cancer screening: last pap: 09/2016, results: WNL, completed by: Dr. Elon Alas Immunizations: tdap 01/2016, Influenza declined- [facility]ouraged [facility]). shingrix #1 provided today.  Infectious disease screening: HIV completed  Return in about 1 year (around 06/13/2019) for CPE. 6 mos [place] 3 mos nurse visit for shringrix #2  Electronically signed by: Howard Pouch, DO [place]

## 2018-06-13 DIAGNOSIS — H524 Presbyopia: Secondary | ICD-10-CM | POA: Diagnosis not present

## 2018-06-13 DIAGNOSIS — H52223 Regular astigmatism, bilateral: Secondary | ICD-10-CM | POA: Diagnosis not present

## 2018-06-13 DIAGNOSIS — H5213 Myopia, bilateral: Secondary | ICD-10-CM | POA: Diagnosis not present

## 2018-08-26 DIAGNOSIS — Z23 Encounter for immunization: Secondary | ICD-10-CM | POA: Diagnosis not present

## 2018-08-26 DIAGNOSIS — H40033 Anatomical narrow angle, bilateral: Secondary | ICD-10-CM | POA: Diagnosis not present

## 2018-08-26 DIAGNOSIS — H5340 Unspecified visual field defects: Secondary | ICD-10-CM | POA: Diagnosis not present

## 2018-08-26 DIAGNOSIS — H47231 Glaucomatous optic atrophy, right eye: Secondary | ICD-10-CM | POA: Diagnosis not present

## 2018-08-28 ENCOUNTER — Encounter: Payer: Self-pay | Admitting: Family Medicine

## 2018-09-13 ENCOUNTER — Ambulatory Visit: Payer: 59

## 2018-10-10 ENCOUNTER — Ambulatory Visit (INDEPENDENT_AMBULATORY_CARE_PROVIDER_SITE_OTHER): Payer: 59

## 2018-10-10 DIAGNOSIS — Z23 Encounter for immunization: Secondary | ICD-10-CM

## 2018-12-11 ENCOUNTER — Ambulatory Visit: Payer: 59 | Admitting: Family Medicine

## 2018-12-11 ENCOUNTER — Encounter: Payer: Self-pay | Admitting: Family Medicine

## 2018-12-11 ENCOUNTER — Ambulatory Visit (INDEPENDENT_AMBULATORY_CARE_PROVIDER_SITE_OTHER): Payer: 59 | Admitting: Family Medicine

## 2018-12-11 ENCOUNTER — Other Ambulatory Visit: Payer: Self-pay

## 2018-12-11 VITALS — Ht 65.0 in

## 2018-12-11 DIAGNOSIS — E782 Mixed hyperlipidemia: Secondary | ICD-10-CM | POA: Diagnosis not present

## 2018-12-11 DIAGNOSIS — F419 Anxiety disorder, unspecified: Secondary | ICD-10-CM | POA: Diagnosis not present

## 2018-12-11 DIAGNOSIS — I10 Essential (primary) hypertension: Secondary | ICD-10-CM

## 2018-12-11 DIAGNOSIS — F4000 Agoraphobia, unspecified: Secondary | ICD-10-CM | POA: Diagnosis not present

## 2018-12-11 DIAGNOSIS — M436 Torticollis: Secondary | ICD-10-CM

## 2018-12-11 MED ORDER — AMLODIPINE BESYLATE 2.5 MG PO TABS: 2.5000 mg | ORAL_TABLET | Freq: Every day | ORAL | 1 refills | Status: DC

## 2018-12-11 MED ORDER — BLOOD PRESSURE MONITORING KIT: PACK | 0 refills | Status: DC

## 2018-12-11 MED ORDER — VENLAFAXINE HCL ER 150 MG PO CP24: 150.0000 mg | ORAL_CAPSULE | Freq: Every day | ORAL | 1 refills | Status: DC

## 2018-12-11 NOTE — Progress Notes (Signed)
VIRTUAL VISIT VIA VIDEO  I connected with Alexandria Weber on 12/11/18 at  8:20 AM EDT by a video enabled telemedicine application and verified that I am speaking with the correct person using two identifiers. Location patient: Home Location provider: [facility], Office Persons participating in the virtual visit: Patient, Dr. Raoul Pitch and R.Baker, LPN  I discussed the limitations of evaluation and management by telemedicine and the availability of in person appointments. The patient expressed understanding and agreed to proceed.   SUBJECTIVE Chief Complaint  Patient presents with  . Hypertension    No complaints. Will need refills on medications. Dog ate BP cuff, unable to check at home   . Hyperlipidemia  . Anxiety    HPI:  Essential hypertension/HLD Pt reports compliancewith amlodipine 2.5 mg QD.  Blood pressures has not been checked since her dog ate her cuff. Patient denies chest pain, shortness of breath, dizziness or lower extremity edema.  BMP: 06/12/2018 within normal limits CBC: 06/12/2018 mildly elevated hemoglobin at 15.3, hematocrit 44.9 TSH: 06/12/2018 1.20 normal Lipids: 06/12/2018 total cholesterol 227, HDL 70, LDL 143, triglycerides 73. Diet: High-fiber diet low saturated fat, lower sodium Exercise: Not performing routine exercises RF: Hypertension, no history of heart disease and stroke  Torticollis, acquired/Agoraphobia/Anxiety Pt reports she is doing very well. Compliance  with effexor and valium. She typically  takes 1 to 1.5 of her prescribed BID PRN doses daily.  Indication for controlled substance: anxiety-torticollis Medication and dose: valium 5 mg BID PRN # pills per month: 60 Last UDS date: due at CPE Controlled contract signed (Y/N): Y Date narcotic database last reviewed (include red flags): 12/11/18  ROS: See pertinent positives and negatives per HPI.  Patient Active Problem List   Diagnosis Date Noted  . Agoraphobia 08/08/2017  .  Overweight (BMI 25.0-29.9) 07/05/2017  . Hyperlipidemia 01/25/2016  . Encounter for preventive health examination 07/27/2015  . Torticollis, acquired 07/27/2015  . Mitral valve stenosis 03/20/2014  . Hypertension 04/04/2012  . Anxiety     Social History   Tobacco Use  . Smoking status: Never Smoker  . Smokeless tobacco: Never Used  Substance Use Topics  . Alcohol use: No    Current Outpatient Medications:  .  amLODipine (NORVASC) 2.5 MG tablet, Take 1 tablet (2.5 mg total) by mouth daily., Disp: 90 tablet, Rfl: 1 .  diazepam (VALIUM) 5 MG tablet, Take 1 tablet (5 mg total) by mouth every 12 (twelve) hours as needed for anxiety., Disp: 60 tablet, Rfl: 5 .  venlafaxine XR (EFFEXOR-XR) 150 MG 24 hr capsule, Take 1 capsule (150 mg total) by mouth daily with breakfast., Disp: 90 capsule, Rfl: 1  Allergies  Allergen Reactions  . Penicillins Hives  . Sulfa Antibiotics Hives  . Zoloft [Sertraline Hcl]     OBJECTIVE: Ht 5\' 5"  (1.651 m)   BMI 24.30 kg/m  Gen: No acute distress. Nontoxic in appearance.  HENT: AT. [place].  MMM.  Eyes:Pupils Equal Round Reactive to light, Extraocular movements intact,   Chest: Cough or shortness of breath not present.  Neuro:  Alert. Oriented x3  Psych: Normal affect, dress and demeanor. Normal speech. Normal thought content and judgment. Depression screen [facility] 2/9 12/11/2018 06/12/2018 12/20/2017 10/10/2017 10/10/2017  Decreased Interest 0 0 0 0 0  Down, Depressed, Hopeless 0 0 0 0 0  PHQ - 2 Score 0 0 0 0 0  Altered sleeping 1 0 - 0 -  Tired, decreased energy 0 0 - 0 -  Change in appetite  0 0 - 0 -  Feeling bad or failure about yourself  0 0 - 0 -  Trouble concentrating 0 0 - 0 -  Moving slowly or fidgety/restless 0 1 - 0 -  Suicidal thoughts 0 0 - 0 -  PHQ-9 Score 1 1 - 0 -  Difficult doing work/chores Not difficult at all Not difficult at all - Not difficult at all -   GAD 7 : Generalized Anxiety Score 12/11/2018 01/18/2018 12/20/2017 10/10/2017  Nervous,  Anxious, on Edge 0 1 3 1   Control/stop worrying 0 0 0 0  Worry too much - different things 0 0 0 0  Trouble relaxing 0 0 1 0  Restless 0 1 1 0  Easily annoyed or irritable 0 0 0 0  Afraid - awful might happen 0 0 0 0  Total GAD 7 Score 0 2 5 1   Anxiety Difficulty Not difficult at all - - Not difficult at all     ASSESSMENT AND PLAN: Alexandria Weber is a 53 y.o. female present for  Essential hypertension/HLD - Feels well. Nurse visit for BP check.  - continue to work on higher fiber diet and routine exercise.  - refills provided/continue  amLODipine (NORVASC) 2.5 MG tablet; Take 1 tablet (2.5 mg total) by mouth daily.  Dispense: 90 tablet; Refill: 1 - BP DME printed - 6 mos CPE with labs  Torticollis, acquired - Stable. Does not desire further botox injections.   - benzo contract in place.  - NCCS database reviewed 12/11/18 and appropriate - diazepam (VALIUM) 5 MG tablet; Take 1 tablet (5 mg total) by mouth every 12 (twelve) hours as needed for anxiety.  Dispense: 60 tablet; Refill: 5 - f/u 6 mos Agoraphobia/Anxiety - Stable. Told she could take valium 7.5-10 Qd if desired if needing more coverage.  - NCCS database reviewed 12/11/18 - refills provided on meds - venlafaxine XR (EFFEXOR-XR) 150 MG 24 hr capsule; Take 1 capsule (150 mg total) by mouth daily with breakfast.  Dispense: 90 capsule; Refill: 1 - diazepam (VALIUM) 5 MG tablet; Take 1 tablet (5 mg total) by mouth every 12 (twelve) hours as needed for anxiety.  Dispense: 60 tablet; Refill: 5 6 mos  Nurse visit this week for BP check.  6 mos CPE  > 25 minutes spent with patient, >50% of time spent face to face counseling     Howard Pouch, DO 12/11/2018

## 2018-12-13 ENCOUNTER — Ambulatory Visit (INDEPENDENT_AMBULATORY_CARE_PROVIDER_SITE_OTHER): Payer: 59 | Admitting: Family Medicine

## 2018-12-13 ENCOUNTER — Encounter: Payer: Self-pay | Admitting: Family Medicine

## 2018-12-13 VITALS — BP 123/75 | HR 76

## 2018-12-13 DIAGNOSIS — I10 Essential (primary) hypertension: Secondary | ICD-10-CM

## 2018-12-13 NOTE — Addendum Note (Signed)
Addended by: Howard Pouch A on: 12/13/2018 12:23 PM   Modules accepted: Level of Service

## 2018-12-13 NOTE — Progress Notes (Signed)
Patient advised, voiced understanding.

## 2018-12-13 NOTE — Progress Notes (Addendum)
Alexandria Weber is a 53 y.o. female presents to the office today for Blood pressure recheck secondary to Virtual visit and unable to check BP at home. Blood pressure medication: amlodipine 2.5 mg tabs daily. If on medication, Last dose was at least 1-2 hours prior to recheck: Yes Blood pressure was taken in the left arm after patient rested for 10 minutes.   BP 123/75, Pulse 76  Stephanos Fan A.,CMA ______________________________________________________________ BP looks great.  Continue current regimen.   Electronically Signed by: Howard Pouch, DO [place] [facility] [place]

## 2019-06-24 ENCOUNTER — Encounter: Payer: Self-pay | Admitting: Family Medicine

## 2019-06-24 ENCOUNTER — Ambulatory Visit (INDEPENDENT_AMBULATORY_CARE_PROVIDER_SITE_OTHER): Payer: 59 | Admitting: Family Medicine

## 2019-06-24 ENCOUNTER — Other Ambulatory Visit: Payer: Self-pay

## 2019-06-24 VITALS — BP 118/78 | HR 83 | Temp 97.6°F | Resp 16 | Ht 66.0 in | Wt 151.5 lb

## 2019-06-24 DIAGNOSIS — E782 Mixed hyperlipidemia: Secondary | ICD-10-CM

## 2019-06-24 DIAGNOSIS — Z1231 Encounter for screening mammogram for malignant neoplasm of breast: Secondary | ICD-10-CM

## 2019-06-24 DIAGNOSIS — M436 Torticollis: Secondary | ICD-10-CM

## 2019-06-24 DIAGNOSIS — Z1211 Encounter for screening for malignant neoplasm of colon: Secondary | ICD-10-CM | POA: Diagnosis not present

## 2019-06-24 DIAGNOSIS — F4000 Agoraphobia, unspecified: Secondary | ICD-10-CM | POA: Diagnosis not present

## 2019-06-24 DIAGNOSIS — F419 Anxiety disorder, unspecified: Secondary | ICD-10-CM | POA: Diagnosis not present

## 2019-06-24 DIAGNOSIS — Z131 Encounter for screening for diabetes mellitus: Secondary | ICD-10-CM | POA: Diagnosis not present

## 2019-06-24 DIAGNOSIS — Z0000 Encounter for general adult medical examination without abnormal findings: Secondary | ICD-10-CM

## 2019-06-24 DIAGNOSIS — I10 Essential (primary) hypertension: Secondary | ICD-10-CM | POA: Diagnosis not present

## 2019-06-24 DIAGNOSIS — E663 Overweight: Secondary | ICD-10-CM

## 2019-06-24 LAB — CBC
HCT: 43.8 % (ref 36.0–46.0)
Hemoglobin: 14.6 g/dL (ref 12.0–15.0)
MCHC: 33.3 g/dL (ref 30.0–36.0)
MCV: 92.1 fl (ref 78.0–100.0)
Platelets: 332 10*3/uL (ref 150.0–400.0)
RBC: 4.76 Mil/uL (ref 3.87–5.11)
RDW: 12.6 % (ref 11.5–15.5)
WBC: 4.9 10*3/uL (ref 4.0–10.5)

## 2019-06-24 LAB — COMPREHENSIVE METABOLIC PANEL
ALT: 22 U/L (ref 0–35)
AST: 23 U/L (ref 0–37)
Albumin: 4.6 g/dL (ref 3.5–5.2)
Alkaline Phosphatase: 126 U/L — ABNORMAL HIGH (ref 39–117)
BUN: 13 mg/dL (ref 6–23)
CO2: 33 mEq/L — ABNORMAL HIGH (ref 19–32)
Calcium: 8.9 mg/dL (ref 8.4–10.5)
Chloride: 102 mEq/L (ref 96–112)
Creatinine, Ser: 0.72 mg/dL (ref 0.40–1.20)
GFR: 85.27 mL/min (ref >60.00)
Glucose, Bld: 93 mg/dL (ref 70–99)
Potassium: 3.9 mEq/L (ref 3.5–5.1)
Sodium: 142 mEq/L (ref 135–145)
Total Bilirubin: 0.8 mg/dL (ref 0.2–1.2)
Total Protein: 7.1 g/dL (ref 6.0–8.3)

## 2019-06-24 LAB — HEMOGLOBIN A1C: Hgb A1c MFr Bld: 5.3 % (ref 4.6–6.5)

## 2019-06-24 LAB — TSH: TSH: 1.62 u[IU]/mL (ref 0.35–4.50)

## 2019-06-24 LAB — LIPID PANEL
Cholesterol: 236 mg/dL — ABNORMAL HIGH (ref 0–200)
HDL: 64.6 mg/dL (ref >39.00)
LDL Cholesterol: 152 mg/dL — ABNORMAL HIGH (ref 0–99)
NonHDL: 171.76
Total CHOL/HDL Ratio: 4
Triglycerides: 100 mg/dL (ref 0.0–149.0)
VLDL: 20 mg/dL (ref 0.0–40.0)

## 2019-06-24 MED ORDER — VENLAFAXINE HCL ER 150 MG PO CP24: 150.0000 mg | ORAL_CAPSULE | Freq: Every day | ORAL | 1 refills | Status: DC

## 2019-06-24 MED ORDER — AMLODIPINE BESYLATE 2.5 MG PO TABS: 2.5000 mg | ORAL_TABLET | Freq: Every day | ORAL | 1 refills | Status: DC

## 2019-06-24 MED ORDER — DIAZEPAM 5 MG PO TABS: 5.0000 mg | ORAL_TABLET | Freq: Two times a day (BID) | ORAL | 5 refills | Status: DC | PRN

## 2019-06-24 NOTE — Progress Notes (Signed)
Patient ID: Alexandria Weber, female  DOB: 10-Mar-1966, 53 y.o.   MRN: [id number] Patient Care Team    Relationship Specialty Notifications Start End  Ma Hillock, DO PCP - General Family Medicine  07/27/15   Huel Cote, NP Nurse Practitioner Obstetrics and Gynecology  09/05/16     Chief Complaint  Patient presents with  . Annual Exam    Fasting. Pap smear 09/2016, Mammogram 06/2018    Subjective:  Alexandria Weber is a 53 y.o.  Female  present for CPE. All past medical history, surgical history, allergies, family history, immunizations, medications and social history were updated in the electronic medical record today. All recent labs, ED visits and hospitalizations within the last year were reviewed.  Health maintenance:  Colonoscopy:no fhx, screen 52--> referred to [facility] did not set up>> ok with cologuard>> ordered today Mammogram: completed:09/06/2017, birads1. No fhx.. Breast center [place]>> ordered today Cervical cancer screening: last pap:09/2016, results:WNL, completed by:Dr. Elon Alas Immunizations: tdap6/2017, InfluenzaUTD 2020.  shingrix series completed.   Infectious disease screening: HIVcompleted DEXA:N/A Assistive device: none Oxygen BWL:SLHT Patient has a Dental home. Hospitalizations/ED visits: reviewed  Essential hypertension/HLD Pt reports compliance withamlodipine 2.5 mg QD. Patient denies chest pain, shortness of breath, dizziness or lower extremity edema.    BMP: 06/12/2018 within normal limits CBC: 06/12/2018 mildly elevated hemoglobin at 15.3, hematocrit 44.9 TSH: 06/12/2018 1.20 normal Lipids: 06/12/2018 total cholesterol 227, HDL 70, LDL 143, triglycerides 73. Diet: High-fiber diet low saturated fat, lower sodium Exercise: Not performing routine exercises RF: Hypertension, no history of heart disease and stroke  Torticollis, acquired/Agoraphobia/Anxiety Pt reports she is doing well consider all the added anxiety 2/2 to pandemic  and world events. Compliance  with effexor and valium. She rarely takes valium now, but does need it occasionally for her anxiety and torticollis.  Indication for controlled substance: anxiety-torticollis. Last refill 09/2018. Medication and dose: valium 5 mg BID PRN # pills per month: 60 Last UDS date: next visit.  Controlled contract signed (Y/N): Y Date narcotic database last reviewed (include red flags): 06/24/19   Depression screen [facility] 2/9 06/24/2019 12/11/2018 06/12/2018 12/20/2017 10/10/2017  Decreased Interest 0 0 0 0 0  Down, Depressed, Hopeless 0 0 0 0 0  PHQ - 2 Score 0 0 0 0 0  Altered sleeping 0 1 0 - 0  Tired, decreased energy 0 0 0 - 0  Change in appetite 0 0 0 - 0  Feeling bad or failure about yourself  0 0 0 - 0  Trouble concentrating 0 0 0 - 0  Moving slowly or fidgety/restless 0 0 1 - 0  Suicidal thoughts 0 0 0 - 0  PHQ-9 Score 0 1 1 - 0  Difficult doing work/chores Not difficult at all Not difficult at all Not difficult at all - Not difficult at all   GAD 7 : Generalized Anxiety Score 06/24/2019 12/11/2018 01/18/2018 12/20/2017  Nervous, Anxious, on Edge 0 0 1 3  Control/stop worrying 0 0 0 0  Worry too much - different things 0 0 0 0  Trouble relaxing 1 0 0 1  Restless 0 0 1 1  Easily annoyed or irritable 0 0 0 0  Afraid - awful might happen 0 0 0 0  Total GAD 7 Score 1 0 2 5  Anxiety Difficulty Not difficult at all Not difficult at all - -    Immunization History  Administered Date(s) Administered  . Influenza Inj Mdck Quad Pf 08/26/2018  .  Influenza, Quadrivalent, Recombinant, Inj, Pf 05/18/2019  . Influenza,inj,Quad PF,6+ Mos 07/27/2015, 09/05/2016  . Influenza-Unspecified 06/17/2013, 06/17/2014, 06/05/2017  . Tdap 01/25/2016  . Zoster Recombinat (Shingrix) 06/12/2018, 10/10/2018     Past Medical History:  Diagnosis Date  . Anxiety   . Depression   . Fibroadenoma of breast    H/O IN LEFT BREAST NO CHANGE PER [place]  . Heart murmur   . High cholesterol    . Hypertension   . Migraine headache   . PMS (premenstrual syndrome)    Allergies  Allergen Reactions  . Penicillins Hives  . Sulfa Antibiotics Hives  . Zoloft [Sertraline Hcl]    Past Surgical History:  Procedure Laterality Date  . CESAREAN SECTION     x2  . TONSILLECTOMY     Family History  Problem Relation Age of Onset  . Heart disease Mother   . Hearing loss Mother   . Heart disease Father   . Hypertension Father   . Stroke Father   . Arthritis Father   . Hypertension Maternal Grandfather   . Hypertension Paternal Grandfather   . Heart disease Paternal Grandfather   . Drug abuse Brother        committed suicide  . Diabetes Mellitus II Maternal Aunt   . Diabetes Mellitus II Paternal Aunt   . Heart disease Paternal Uncle   . Colon cancer Neg Hx   . Breast cancer Neg Hx    Social History   Social History Narrative   Married. Spouse is name Suezanne Jacquet. 3 children.   High school graduate. Full-time employed. 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   Drinks caffeinated beverages, no tobacco, no alcohol, no recreational drugs.   Wears her seatbelt. Smoke detectors located in the home. Firearms located in the home in a locked case.   Feels safe in her relationship.    Allergies as of 06/24/2019      Reactions   Penicillins Hives   Sulfa Antibiotics Hives   Zoloft [sertraline Hcl]       Medication List       Accurate as of June 24, 2019  9:07 AM. If you have any questions, ask your nurse or doctor.        amLODipine 2.5 MG tablet Commonly known as: NORVASC Take 1 tablet (2.5 mg total) by mouth daily.   Blood Pressure Monitoring Kit 1 adult BP kit. Monitor BP two  times a month. DX code: I10   diazepam 5 MG tablet Commonly known as: VALIUM Take 1 tablet (5 mg total) by mouth every 12 (twelve) hours as needed for anxiety.   venlafaxine XR 150 MG 24 hr capsule Commonly known as: EFFEXOR-XR Take 1 capsule (150 mg total) by mouth daily with breakfast.        All past medical history, surgical history, allergies, family history, immunizations andmedications were updated in the EMR today and reviewed under the history and medication portions of their EMR.     No results found for this or any previous visit (from the past 2160 hour(s)).  Mm Digital Screening Bilateral  Result Date: 06/07/2018 CLINICAL DATA:  Screening. EXAM: DIGITAL SCREENING BILATERAL MAMMOGRAM WITH CAD COMPARISON:  Previous exam(s). ACR Breast Density Category b: There are scattered areas of fibroglandular density. FINDINGS: There are no findings suspicious for malignancy. Images were processed with CAD. IMPRESSION: No mammographic evidence of malignancy. A result letter of this screening mammogram will be mailed directly to the patient. RECOMMENDATION: Screening mammogram in one year. (Code:SM-B-01Y)  BI-RADS CATEGORY  1: Negative. Electronically Signed   By: Ammie Ferrier M.D.   On: 06/07/2018 11:47     ROS: 14 pt review of systems performed and negative (unless mentioned in an HPI)  Objective: BP 118/78 (BP Location: Left Arm, Patient Position: Sitting, Cuff Size: Normal)   Pulse 83   Temp 97.6 F (36.4 C) (Temporal)   Resp 16   Ht _0  (1.676 m)   Wt 151 lb 8 oz (68.7 kg)   SpO2 99%   BMI 24.45 kg/m  Gen: Afebrile. No acute distress. Nontoxic in appearance, well-developed, well-nourished,  Pleasant caucasian female. HENT: AT. [place]. Bilateral TM visualized and normal in appearance, normal external auditory canal. MMM, no oral lesions, adequate dentition. Bilateral nares within normal limits. Throat without erythema, ulcerations or exudates. no Cough on exam, no hoarseness on exam. Eyes:Pupils Equal Round Reactive to light, Extraocular movements intact,  Conjunctiva without redness, discharge or icterus. Neck/lymp/endocrine: Supple,no lymphadenopathy, no thyromegaly CV: RRR no murmur, no edema, +2/4 P posterior tibialis pulses. no carotid bruits. No JVD. Chest: CTAB, no  wheeze, rhonchi or crackles. normal Respiratory effort. good Air movement. Abd: Soft. falt. NTND. BS present. no Masses palpated. No hepatosplenomegaly. No rebound tenderness or guarding. Skin: no rashes, purpura or petechiae. Warm and well-perfused. Skin intact. Neuro/Msk:  Normal gait. PERLA. EOMi. Alert. Oriented x3.  Cranial nerves II through XII intact. Muscle strength 5/5 upper/lower extremity. DTRs equal bilaterally. Psych: Normal affect, dress and demeanor. Normal speech. Normal thought content and judgment.  No exam data present  Assessment/plan: Kimbery Harwood is a 53 y.o. female present for [place] Essential hypertension/HLD/overeight - stable.  - continue to work on higher fiber diet and routine exercise.  - continue  amLODipine (NORVASC) 2.5 MG tablet; Take 1 tablet (2.5 mg total) by mouth daily. Dispense: 90 tablet; Refill: 1 - cbc, cmp, tsh and lipids collected today - 6 mos CPE with labs  Torticollis, acquired - stable Does not desire further botox injections.  - benzo contract in place.  - NCCS database reviewed 06/24/19  - diazepam (VALIUM) 5 MG tablet; Take 1 tablet (5 mg total) by mouth every 12 (twelve) hours as needed for anxiety. Dispense: 60 tablet; Refill: 5 - f/u 6 mos Agoraphobia/Anxiety - stable.  - NCCS database reviewed11/17/20 - continue effexor at current dose- refills provided today - venlafaxine XR (EFFEXOR-XR) 150 MG 24 hr capsule; Take 1 capsule (150 mg total) by mouth daily with breakfast. Dispense: 90 capsule; Refill: 1 - diazepam (VALIUM) 5 MG tablet; Take 1 tablet (5 mg total) by mouth every 12 (twelve) hours as needed for anxiety. Dispense: 60 tablet; Refill: 5 6 mos Encounter for screening mammogram for malignant neoplasm of breast -  3D SCREEN BREAST BILATERAL; Future Colon cancer screening - Cologuard Diabetes mellitus screening - HgB A1c Encounter for preventive health examination Patient was encouraged to exercise greater than  150 minutes a week. Patient was encouraged to choose a diet filled with fresh fruits and vegetables, and lean meats. AVS provided to patient today for education/recommendation on gender specific health and safety maintenance. Colonoscopy:no fhx, screen 50--> referred to [facility] did not set up>> ok with cologuard>> ordered today Mammogram: completed:09/06/2017, birads1. No fhx.. Breast center [place]>> ordered today Cervical cancer screening: last pap:09/2016, results:WNL, completed by:Dr. Elon Alas Immunizations: tdap6/2017, InfluenzaUTD 2020.  shingrix series completed.   Infectious disease screening: HIVcompleted DEXA:N/A  Return in about 6 months (around 12/22/2019) for [place] (30 min).  and 1 year for CPE  Annual physical completed today, in addition to > 10 minutes spent with patient, > 50% of that time face to face  Electronically signed by: Howard Pouch, [place]

## 2019-06-24 NOTE — Patient Instructions (Signed)
Try miralax 1/4 to 1 cap daily in 8 ounces of water.  Increase water consumption daily can also help - recommend at least 70-80 ounces of water a day.    Constipation, Adult Constipation is when a person:  Poops (has a bowel movement) fewer times in a week than normal.  Has a hard time pooping.  Has poop that is dry, hard, or bigger than normal. Follow these instructions at home: Eating and drinking   Eat foods that have a lot of fiber, such as: ? Fresh fruits and vegetables. ? Whole grains. ? Beans.  Eat less of foods that are high in fat, low in fiber, or overly processed, such as: ? [place] fries. ? Hamburgers. ? Cookies. ? Candy. ? Soda.  Drink enough fluid to keep your pee (urine) clear or pale yellow. General instructions  Exercise regularly or as told by your doctor.  Go to the restroom when you feel like you need to poop. Do not hold it in.  Take over-the-counter and prescription medicines only as told by your doctor. These include any fiber supplements.  Do pelvic floor retraining exercises, such as: ? Doing deep breathing while relaxing your lower belly (abdomen). ? Relaxing your pelvic floor while pooping.  Watch your condition for any changes.  Keep all follow-up visits as told by your doctor. This is important. Contact a doctor if:  You have pain that gets worse.  You have a fever.  You have not pooped for 4 days.  You throw up (vomit).  You are not hungry.  You lose weight.  You are bleeding from the anus.  You have thin, pencil-like poop (stool). Get help right away if:  You have a fever, and your symptoms suddenly get worse.  You leak poop or have blood in your poop.  Your belly feels hard or bigger than normal (is bloated).  You have very bad belly pain.  You feel dizzy or you faint. This information is not intended to replace advice given to you by your health care provider. Make sure you discuss any questions you have with your  health care provider. Document Released: 01/10/2008 Document Revised: 07/06/2017 Document Reviewed: 01/12/2016 Elsevier Patient Education  2020 [place] Maintenance, Female Adopting a healthy lifestyle and getting preventive care are important in promoting health and wellness. Ask your health care provider about:  The right schedule for you to have regular tests and exams.  Things you can do on your own to prevent diseases and keep yourself healthy. What should I know about diet, weight, and exercise? Eat a healthy diet   Eat a diet that includes plenty of vegetables, fruits, low-fat dairy products, and lean protein.  Do not eat a lot of foods that are high in solid fats, added sugars, or sodium. Maintain a healthy weight Body mass index (BMI) is used to identify weight problems. It estimates body fat based on height and weight. Your health care provider can help determine your BMI and help you achieve or maintain a healthy weight. Get regular exercise Get regular exercise. This is one of the most important things you can do for your health. Most adults should:  Exercise for at least 150 minutes each week. The exercise should increase your heart rate and make you sweat (moderate-intensity exercise).  Do strengthening exercises at least twice a week. This is in addition to the moderate-intensity exercise.  Spend less time sitting. Even light physical activity can be beneficial. Watch cholesterol  and blood lipids Have your blood tested for lipids and cholesterol at 51 years of age, then have this test every 5 years. Have your cholesterol levels checked more often if:  Your lipid or cholesterol levels are high.  You are older than 51 years of age.  You are at high risk for heart disease. What should I know about cancer screening? Depending on your health history and family history, you may need to have cancer screening at various ages. This may include screening for:   Breast cancer.  Cervical cancer.  Colorectal cancer.  Skin cancer.  Lung cancer. What should I know about heart disease, diabetes, and high blood pressure? Blood pressure and heart disease  High blood pressure causes heart disease and increases the risk of stroke. This is more likely to develop in people who have high blood pressure readings, are of African descent, or are overweight.  Have your blood pressure checked: ? Every 3-5 years if you are 67-12 years of age. ? Every year if you are 59 years old or older. Diabetes Have regular diabetes screenings. This checks your fasting blood sugar level. Have the screening done:  Once every three years after age 55 if you are at a normal weight and have a low risk for diabetes.  More often and at a younger age if you are overweight or have a high risk for diabetes. What should I know about preventing infection? Hepatitis B If you have a higher risk for hepatitis B, you should be screened for this virus. Talk with your health care provider to find out if you are at risk for hepatitis B infection. Hepatitis C Testing is recommended for:  Everyone born from [birth year] through 1963.  Anyone with known risk factors for hepatitis C. Sexually transmitted infections (STIs)  Get screened for STIs, including gonorrhea and chlamydia, if: ? You are sexually active and are younger than 51 years of age. ? You are older than 52 years of age and your health care provider tells you that you are at risk for this type of infection. ? Your sexual activity has changed since you were last screened, and you are at increased risk for chlamydia or gonorrhea. Ask your health care provider if you are at risk.  Ask your health care provider about whether you are at high risk for HIV. Your health care provider may recommend a prescription medicine to help prevent HIV infection. If you choose to take medicine to prevent HIV, you should first get tested for HIV. You  should then be tested every 3 months for as long as you are taking the medicine. Pregnancy  If you are about to stop having your period (premenopausal) and you may become pregnant, seek counseling before you get pregnant.  Take 400 to 800 micrograms (mcg) of folic acid every day if you become pregnant.  Ask for birth control (contraception) if you want to prevent pregnancy. Osteoporosis and menopause Osteoporosis is a disease in which the bones lose minerals and strength with aging. This can result in bone fractures. If you are 64 years old or older, or if you are at risk for osteoporosis and fractures, ask your health care provider if you should:  Be screened for bone loss.  Take a calcium or vitamin D supplement to lower your risk of fractures.  Be given hormone replacement therapy (HRT) to treat symptoms of menopause. Follow these instructions at home: Lifestyle  Do not use any products that contain nicotine or tobacco,  such as cigarettes, e-cigarettes, and chewing tobacco. If you need help quitting, ask your health care provider.  Do not use street drugs.  Do not share needles.  Ask your health care provider for help if you need support or information about quitting drugs. Alcohol use  Do not drink alcohol if: ? Your health care provider tells you not to drink. ? You are pregnant, may be pregnant, or are planning to become pregnant.  If you drink alcohol: ? Limit how much you use to 0-1 drink a day. ? Limit intake if you are breastfeeding.  Be aware of how much alcohol is in your drink. In the [place] one drink equals one 12 oz bottle of beer (355 mL), one 5 oz glass of wine (148 mL), or one 1 oz glass of hard liquor (44 mL). General instructions  Schedule regular health, dental, and eye exams.  Stay current with your vaccines.  Tell your health care provider if: ? You often feel depressed. ? You have ever been abused or do not feel safe at home. Summary  Adopting a  healthy lifestyle and getting preventive care are important in promoting health and wellness.  Follow your health care provider's instructions about healthy diet, exercising, and getting tested or screened for diseases.  Follow your health care provider's instructions on monitoring your cholesterol and blood pressure. This information is not intended to replace advice given to you by your health care provider. Make sure you discuss any questions you have with your health care provider. Document Released: 02/06/2011 Document Revised: 07/17/2018 Document Reviewed: 07/17/2018 Elsevier Patient Education  2020 [employer].

## 2019-06-25 ENCOUNTER — Telehealth: Payer: Self-pay | Admitting: Family Medicine

## 2019-06-25 MED ORDER — ATORVASTATIN CALCIUM 10 MG PO TABS: 10.0000 mg | ORAL_TABLET | Freq: Every day | ORAL | 3 refills | Status: DC

## 2019-06-25 NOTE — Telephone Encounter (Signed)
Please inform patient the following information: Her liver, kidney, thyroid functions are normal. -She does have a mildly elevated alkaline phosphatase level.  This is new for her.  This enzyme comes from multiple places in the body including liver and bones mostly.  It can mean certain conditions of the liver, gallbladder or bone disorders (including low vitamin D, parathyroid hormone are osteoporosis).  Since it is only mildly elevated, would recommend we follow-up in a few months and repeat the test.  Her cholesterol is higher than last year with a total cholesterol of 236, HDL/good cholesterol 64 and an LDL/bad cholesterol 152.  Goal LDL cholesterol for her would be less than 130 and more preferably closer to 100 given her family history.  Her cholesterol is getting to the level of putting her at a higher cardiovascular risk as the LDL is encroaching on 160.  I would recommend a low-dose statin medication to help her decrease her cholesterol and provide her with cardiovascular protection.  I have called in a very low-dose of this medication.      -Of course increase in exercise, greater than 150 minutes a week as goal and dietary modification such as trying the Mediterranean diet, can also be beneficial and help lower her cholesterol as well. -  follow-up in 2 months fasting with provider, to recheck both cholesterol and the elevation in the alkaline phosphatase.

## 2019-06-26 NOTE — Telephone Encounter (Signed)
Called patient and discussed lab results and recommendations. Patient is aware of new medication. Patient will follow up in January. Appointment has been made

## 2019-07-16 LAB — COLOGUARD: Cologuard: NEGATIVE

## 2019-08-08 DIAGNOSIS — K8020 Calculus of gallbladder without cholecystitis without obstruction: Secondary | ICD-10-CM

## 2019-08-08 HISTORY — DX: Calculus of gallbladder without cholecystitis without obstruction: K80.20

## 2019-08-21 ENCOUNTER — Other Ambulatory Visit: Payer: Self-pay

## 2019-08-21 ENCOUNTER — Ambulatory Visit
Admission: RE | Admit: 2019-08-21 | Discharge: 2019-08-21 | Disposition: A | Discharge status: Home / Self Care | Payer: 59 | Source: Ambulatory Visit | Attending: Family Medicine | Admitting: Family Medicine

## 2019-08-21 DIAGNOSIS — Z1231 Encounter for screening mammogram for malignant neoplasm of breast: Secondary | ICD-10-CM

## 2019-08-26 ENCOUNTER — Ambulatory Visit (INDEPENDENT_AMBULATORY_CARE_PROVIDER_SITE_OTHER): Payer: 59 | Admitting: Family Medicine

## 2019-08-26 ENCOUNTER — Other Ambulatory Visit: Payer: Self-pay

## 2019-08-26 ENCOUNTER — Encounter: Payer: Self-pay | Admitting: Family Medicine

## 2019-08-26 VITALS — BP 138/88 | HR 98 | Temp 98.7°F | Resp 16 | Ht 66.0 in | Wt 157.4 lb

## 2019-08-26 DIAGNOSIS — Z79899 Other long term (current) drug therapy: Secondary | ICD-10-CM | POA: Diagnosis not present

## 2019-08-26 DIAGNOSIS — R300 Dysuria: Secondary | ICD-10-CM | POA: Diagnosis not present

## 2019-08-26 DIAGNOSIS — E785 Hyperlipidemia, unspecified: Secondary | ICD-10-CM

## 2019-08-26 DIAGNOSIS — R748 Abnormal levels of other serum enzymes: Secondary | ICD-10-CM | POA: Diagnosis not present

## 2019-08-26 LAB — POCT URINALYSIS DIPSTICK
Glucose, UA: NEGATIVE
Ketones, UA: NEGATIVE
Nitrite, UA: POSITIVE
Protein, UA: POSITIVE — AB
Spec Grav, UA: 1.015 (ref 1.010–1.025)
Urobilinogen, UA: 2 E.U./dL — AB
pH, UA: 7 (ref 5.0–8.0)

## 2019-08-26 MED ORDER — NITROFURANTOIN MONOHYD MACRO 100 MG PO CAPS: 100.0000 mg | ORAL_CAPSULE | Freq: Two times a day (BID) | ORAL | 0 refills | Status: DC

## 2019-08-26 NOTE — Progress Notes (Signed)
Patient ID: Alexandria Weber, female  DOB: [birth year], 54 y.o.   MRN: [id number] Patient Care Team    Relationship Specialty Notifications Start End  Ma Hillock, DO PCP - General Family Medicine  07/27/15   Huel Cote, NP Nurse Practitioner Obstetrics and Gynecology  09/05/16     Chief Complaint  Patient presents with  . Hyperlipidemia    Pt is here for a F/U to recheck her Alk phosphate level and started Statin at last visit, lipid panel recheck   . Urinary Tract Infection    Pt states UTI symptoms started yesterday with burning. Taking OTC medication and cranberry juice. Denies fever. Concerned labs may be off today from this and OTC meds     Subjective:  Alexandria Weber is a 54 y.o.  Female  present for f/u on elevated alk phos and H:D after starting statin.  HLD/on statin therapy Patient reports she is tolerating the statin medication.  She denies any side effects secondary to statin medication.  Elevated alk phos: Patient was found to have an elevated alk phos on her complete physical labs on 06/24/2019 with an alk phos of 126.  Patient presents today to discuss abnormal lab and follow-up for repeat labs.  She does not have a history of vitamin D deficiency, parathyroid disorder or liver disease.  She denies any nausea, vomit or right upper quadrant discomfort.  She has no medication changes.  Dysuria: Patient reports she started having burning with urination that started yesterday.  She started taking urostat and cranberry, and it has relieved some of the discomfort.  She endorses urinary frequency.  He denies fever, chills, nausea or lower back pain.  Depression screen [facility] 2/9 06/24/2019 12/11/2018 06/12/2018 12/20/2017 10/10/2017  Decreased Interest 0 0 0 0 0  Down, Depressed, Hopeless 0 0 0 0 0  PHQ - 2 Score 0 0 0 0 0  Altered sleeping 0 1 0 - 0  Tired, decreased energy 0 0 0 - 0  Change in appetite 0 0 0 - 0  Feeling bad or failure about yourself  0 0 0 - 0    Trouble concentrating 0 0 0 - 0  Moving slowly or fidgety/restless 0 0 1 - 0  Suicidal thoughts 0 0 0 - 0  PHQ-9 Score 0 1 1 - 0  Difficult doing work/chores Not difficult at all Not difficult at all Not difficult at all - Not difficult at all   GAD 7 : Generalized Anxiety Score 06/24/2019 12/11/2018 01/18/2018 12/20/2017  Nervous, Anxious, on Edge 0 0 1 3  Control/stop worrying 0 0 0 0  Worry too much - different things 0 0 0 0  Trouble relaxing 1 0 0 1  Restless 0 0 1 1  Easily annoyed or irritable 0 0 0 0  Afraid - awful might happen 0 0 0 0  Total GAD 7 Score 1 0 2 5  Anxiety Difficulty Not difficult at all Not difficult at all - -    Immunization History  Administered Date(s) Administered  . Influenza Inj Mdck Quad Pf 08/26/2018  . Influenza, Quadrivalent, Recombinant, Inj, Pf 05/18/2019  . Influenza,inj,Quad PF,6+ Mos 07/27/2015, 09/05/2016  . Influenza-Unspecified 06/17/2013, 06/17/2014, 06/05/2017  . Tdap 01/25/2016  . Zoster Recombinat (Shingrix) 06/12/2018, 10/10/2018     Past Medical History:  Diagnosis Date  . Anxiety   . Depression   . Fibroadenoma of breast    H/O IN LEFT BREAST NO CHANGE PER [place]  .  Heart murmur   . High cholesterol   . Hypertension   . Migraine headache   . PMS (premenstrual syndrome)    Allergies  Allergen Reactions  . Penicillins Hives  . Sulfa Antibiotics Hives  . Zoloft [Sertraline Hcl]    Past Surgical History:  Procedure Laterality Date  . CESAREAN SECTION     x2  . TONSILLECTOMY     Family History  Problem Relation Age of Onset  . Heart disease Mother   . Hearing loss Mother   . Heart disease Father   . Hypertension Father   . Stroke Father   . Arthritis Father   . Hypertension Maternal Grandfather   . Hypertension Paternal Grandfather   . Heart disease Paternal Grandfather   . Drug abuse Brother        committed suicide  . Diabetes Mellitus II Maternal Aunt   . Diabetes Mellitus II Paternal Aunt   . Heart disease  Paternal Uncle   . Colon cancer Neg Hx   . Breast cancer Neg Hx    Social History   Social History Narrative   Married. Spouse is name Suezanne Jacquet. 3 children.   High school graduate. Full-time employed. 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   Drinks caffeinated beverages, no tobacco, no alcohol, no recreational drugs.   Wears her seatbelt. Smoke detectors located in the home. Firearms located in the home in a locked case.   Feels safe in her relationship.    Allergies as of 08/26/2019      Reactions   Penicillins Hives   Sulfa Antibiotics Hives   Zoloft [sertraline Hcl]       Medication List       Accurate as of August 26, 2019  9:26 AM. If you have any questions, ask your nurse or doctor.        amLODipine 2.5 MG tablet Commonly known as: NORVASC Take 1 tablet (2.5 mg total) by mouth daily.   atorvastatin 10 MG tablet Commonly known as: LIPITOR Take 1 tablet (10 mg total) by mouth daily.   Blood Pressure Monitoring Kit 1 adult BP kit. Monitor BP two  times a month. DX code: I10   diazepam 5 MG tablet Commonly known as: VALIUM Take 1 tablet (5 mg total) by mouth every 12 (twelve) hours as needed for anxiety.   nitrofurantoin (macrocrystal-monohydrate) 100 MG capsule Commonly known as: Macrobid Take 1 capsule (100 mg total) by mouth 2 (two) times daily. Started by: Howard Pouch, DO   Uristat 95 MG tablet Generic drug: phenazopyridine Take 95 mg by mouth 3 (three) times daily as needed for pain.   venlafaxine XR 150 MG 24 hr capsule Commonly known as: EFFEXOR-XR Take 1 capsule (150 mg total) by mouth daily with breakfast.       All past medical history, surgical history, allergies, family history, immunizations andmedications were updated in the EMR today and reviewed under the history and medication portions of their EMR.      ROS: 14 pt review of systems performed and negative (unless mentioned in an HPI)  Objective: BP 138/88 (BP Location: Right Arm, Patient  Position: Sitting, Cuff Size: Normal)   Pulse 98   Temp 98.7 F (37.1 C) (Temporal)   Resp 16   Ht 5' 6"  (1.676 m)   Wt 157 lb 6 oz (71.4 kg)   SpO2 97%   BMI 25.40 kg/m  Gen: Afebrile. No acute distress.  HENT: AT. [place].  Eyes:Pupils Equal Round Reactive to light,  Extraocular movements intact,  Conjunctiva without redness, discharge or icterus. CV: RRR Chest: CTAB, no wheeze or crackles Abd: Soft.  Flat. NTND. BS present.  No masses palpated.  MSK: No CVA tenderness Skin: No rashes, purpura or petechiae.  Neuro:  Normal gait. PERLA. EOMi. Alert. Oriented x3  Psych: Normal affect, dress and demeanor. Normal speech. Normal thought content and judgment.  Results for orders placed or performed in visit on 08/26/19 (from the past 24 hour(s))  POCT urinalysis dipstick     Status: Abnormal   Collection Time: 08/26/19  9:09 AM  Result Value Ref Range   Color, UA orange    Clarity, UA clear    Glucose, UA Negative Negative   Bilirubin, UA 2+    Ketones, UA negative    Spec Grav, UA 1.015 1.010 - 1.025   Blood, UA 3+    pH, UA 7.0 5.0 - 8.0   Protein, UA Positive (A) Negative   Urobilinogen, UA 2.0 (A) 0.2 or 1.0 E.U./dL   Nitrite, UA positive    Leukocytes, UA Moderate (2+) (A) Negative   Appearance     Odor      Assessment/plan: Alexandria Weber is a 54 y.o. female present for f/u after abnormal labs Burning with urination -Urine dip appears infectious, however Uristat will throw off her primary care.  Will send for microscopic urinalysis with reflex culture. In the meantime we will prescribe Macrobid to start treatment.  Patient is aware that we may need to change her antibiotic if urine cultures does not show sensitivities to Macrobid. -Rest, hydrate. - POCT urinalysis dipstick - Urinalysis w microscopic + reflex culture -As needed, sooner if worsening  Elevated alkaline phosphatase level -Patient is asymptomatic, will repeat alk phos levels today and work-up vitamin  D/PTH. - Comp Met (CMET) - Vitamin D (25 hydroxy) - PTH, Intact and Calcium -Consider GGT/liver ultrasound if alk phos remains elevated and labs surrounding vitamin D/calcium/PTH are normal.  Hyperlipidemia, unspecified hyperlipidemia type/statin therapy -Tolerating statin nicely.  We will recheck levels today along with LFTs after starting statin. - Lipid panel - Comp Met (CMET) -She will follow up with her chronic conditions   Orders Placed This Encounter  Procedures  . Lipid panel  . Comp Met (CMET)  . Vitamin D (25 hydroxy)  . PTH, Intact and Calcium  . Urinalysis w microscopic + reflex cultur  . POCT urinalysis dipstick   Meds ordered this encounter  Medications  . nitrofurantoin, macrocrystal-monohydrate, (MACROBID) 100 MG capsule    Sig: Take 1 capsule (100 mg total) by mouth 2 (two) times daily.    Dispense:  14 capsule    Refill:  0    Electronically signed by: Howard Pouch, [place]

## 2019-08-26 NOTE — Patient Instructions (Signed)
We are committed to keeping you informed about the COVID-19 vaccine.  As the vaccine continues to become available for each phase, we will ensure that patients who meet the criteria receive the information they need to access vaccination opportunities. Continue to check your MyChart account and [email or web address] for updates. Please review the Phase 1b information below.  Following [place]'s guidelines for the distribution of COVID-19 vaccines we are pleased to share our plans to begin offering vaccines to those 65 and older (Phase 1b). Here are details of those plans:  [facility] [place] On Tuesday, Jan. 19, the [place] [facility] ([facility]) and [place] begin large-scale COVID-19 vaccinations at the [facility]. The vaccinations are appointment only and for those 65 and older.  Walk-ins will not be accepted.  All appointments are currently filled. Please join our waiting list for the next available appointments. We will contact you when appointments become available. Please do not sign up more than once.  Join Our Waiting List   Other Vaccination Opportunities in Our Area We are also working in partnership with county health agencies in our service counties to ensure continuing vaccination availability in the weeks and months ahead. Learn more about each county's vaccination efforts in the website links below:   [place]  [place]  [place]  [place]  [place]   [place]'s phase 1b vaccination guidelines, prioritizing those 65 and over as the next eligible group to receive the COVID-19 vaccine, are detailed at YourSpotYourShot.[place].gov.   Vaccine Safety and Effectiveness Clinical trials for the Pfizer COVID-19 vaccine involved 42,000 people and showed that the vaccine is more than 95% effective in preventing COVID-19 with no serious safety concerns. Similar results have  been reported for the Moderna COVID-19 vaccine. Side effects reported in the Pfizer clinical trials include a sore arm at the injection site, fatigue, headache, chills and fever. While side effects from the Pfizer COVID-19 vaccine are higher than for a typical flu vaccine, they are lower in many ways than side effects from the leading vaccine to prevent shingles. Side effects are signs that a vaccine is working and are related to your immune system being stimulated to produce antibodies against infection. Side effects from vaccination are far less significant than health impacts from COVID-19.  Staying Informed Pharmacists, infectious disease doctors, critical care nurses and other experts at [facility] continue to speak publicly through media interviews and direct communication with our patients and communities about the safety, effectiveness and importance of vaccines to eliminate COVID-19. In addition, reliable information on vaccine safety, effectiveness, side effects and more is available on the following websites:  [place] Department of Health and Human Services COVID-19 Vaccine Information Website.  [place]. Centers for Disease Control and Prevention COVID-19 Vaccine Public Information Website.  Staying Safe We agree with the CDC on what we can do to help our communities get back to normal: Getting "back to normal" is going to take all of our tools. If we use all the tools we have, we stand the best chance of getting our families, communities, schools and workplaces "back to normal" sooner:  Get vaccinated as soon as vaccines become available within the phase of the state's vaccination rollout plan for which you meet the eligibility criteria.  Wear a mask.  Stay 6 feet from others and avoid crowds.  Wash hands often.  For our most current information, please visit [email or web address].  

## 2019-08-27 ENCOUNTER — Telehealth: Payer: Self-pay | Admitting: Family Medicine

## 2019-08-27 MED ORDER — VITAMIN D (ERGOCALCIFEROL) 1.25 MG (50000 UNIT) PO CAPS: 50000.0000 [IU] | ORAL_CAPSULE | ORAL | 0 refills | Status: DC

## 2019-08-27 NOTE — Telephone Encounter (Signed)
Please inform patient the following information: -Her cholesterol panel is much improved with the statin.  Her cholesterol now 156, HDL 77, LDL 65 and triglycerides are 65.  That is fantastic.  Continue statin. Her alk phos is higher than last time 126> 150 now.  Her vit d is severly low at 10 (> 30 nl). Her calcium is low end of normal at 8.8 Her parathyroid hormone is elevated at 126 (NL <64)  -The changes in her labs are most likely secondary to her severely low vitamin D.  I have called in a high-dose once weekly vitamin D supplementation for her to start. She should also start 1000 units of over-the-counter vitamin D daily (hold on the day of high-dose).  Vitamin D is best absorbed if taken with food.  She should also start a calcium carbonate supplement of approximately 1000-1200 mg daily.  Follow-up in 8 weeks with provider, we will need to recheck these levels to ensure they are correcting with supplementation.  If they are not, we would need to pursue additional work-up and consider endocrine referral.  

## 2019-08-27 NOTE — Telephone Encounter (Signed)
Pt was called and given lab results, she asked instructions be sent via my chart so she can look at if she forgets. Sent my chart message. Pt verbalized understanding and F/U appt was made

## 2019-09-01 ENCOUNTER — Telehealth: Payer: Self-pay | Admitting: Family Medicine

## 2019-09-01 NOTE — Telephone Encounter (Signed)
Pt was called and given much better. Pt is no longer having symptoms and will call back if needed. She verbalized understanding on results.

## 2019-09-01 NOTE — Telephone Encounter (Signed)
Please call patient and see how she is recovering from her urinary tract infection. The microanalysis appeared to have blood, nitrites and quite a few bacteria.  Unfortunately the handling lab has lost the urine culture, therefore we do not know the specific bacteria that is causing her symptoms. If she is seeing improvement in her symptoms that would suggest the antibiotic prescribed is working.     If she is not improving please advise and we would need to attempt to call in another antibiotic and get her in for another urine.

## 2019-09-16 LAB — URINALYSIS W MICROSCOPIC + REFLEX CULTURE
Bilirubin Urine: NEGATIVE
Glucose, UA: NEGATIVE
Hyaline Cast: NONE SEEN /LPF
Ketones, ur: NEGATIVE
Nitrites, Initial: POSITIVE — AB
RBC / HPF: >=60 /HPF — AB (ref 0–2)
Specific Gravity, Urine: 1.013 (ref 1.001–1.03)
pH: 7 (ref 5.0–8.0)

## 2019-09-16 LAB — PTH, INTACT AND CALCIUM
Calcium: 8.8 mg/dL (ref 8.6–10.4)
PTH: 126 pg/mL — ABNORMAL HIGH (ref 14–64)

## 2019-09-16 LAB — COMPREHENSIVE METABOLIC PANEL
AG Ratio: 1.8 (calc) (ref 1.0–2.5)
ALT: 19 U/L (ref 6–29)
AST: 25 U/L (ref 10–35)
Albumin: 4.8 g/dL (ref 3.6–5.1)
Alkaline phosphatase (APISO): 150 U/L (ref 37–153)
BUN: 10 mg/dL (ref 7–25)
CO2: 27 mmol/L (ref 20–32)
Calcium: 8.8 mg/dL (ref 8.6–10.4)
Chloride: 99 mmol/L (ref 98–110)
Creat: 0.74 mg/dL (ref 0.50–1.05)
Globulin: 2.6 g/dL (calc) (ref 1.9–3.7)
Glucose, Bld: 91 mg/dL (ref 65–99)
Potassium: 3.5 mmol/L (ref 3.5–5.3)
Sodium: 141 mmol/L (ref 135–146)
Total Bilirubin: 0.8 mg/dL (ref 0.2–1.2)
Total Protein: 7.4 g/dL (ref 6.1–8.1)

## 2019-09-16 LAB — URINE CULTURE

## 2019-09-16 LAB — LIPID PANEL
Cholesterol: 156 mg/dL (ref <200)
HDL: 77 mg/dL (ref >=50)
LDL Cholesterol (Calc): 65 mg/dL (calc)
Non-HDL Cholesterol (Calc): 79 mg/dL (calc) (ref <130)
Total CHOL/HDL Ratio: 2 (calc) (ref <5.0)
Triglycerides: 65 mg/dL (ref <150)

## 2019-09-16 LAB — CULTURE INDICATED

## 2019-09-16 LAB — EXTRA SPECIMEN

## 2019-09-16 LAB — VITAMIN D 25 HYDROXY (VIT D DEFICIENCY, FRACTURES): Vit D, 25-Hydroxy: 10 ng/mL — ABNORMAL LOW (ref 30–100)

## 2019-10-30 ENCOUNTER — Ambulatory Visit (INDEPENDENT_AMBULATORY_CARE_PROVIDER_SITE_OTHER): Payer: 59 | Admitting: Family Medicine

## 2019-10-30 ENCOUNTER — Encounter: Payer: Self-pay | Admitting: Family Medicine

## 2019-10-30 ENCOUNTER — Other Ambulatory Visit: Payer: Self-pay

## 2019-10-30 VITALS — BP 123/79 | HR 90 | Temp 97.7°F | Resp 16 | Ht 66.0 in | Wt 163.0 lb

## 2019-10-30 DIAGNOSIS — E213 Hyperparathyroidism, unspecified: Secondary | ICD-10-CM | POA: Diagnosis not present

## 2019-10-30 DIAGNOSIS — E559 Vitamin D deficiency, unspecified: Secondary | ICD-10-CM | POA: Insufficient documentation

## 2019-10-30 DIAGNOSIS — R748 Abnormal levels of other serum enzymes: Secondary | ICD-10-CM | POA: Diagnosis not present

## 2019-10-30 HISTORY — DX: Hyperparathyroidism, unspecified: E21.3

## 2019-10-30 LAB — VITAMIN D 25 HYDROXY (VIT D DEFICIENCY, FRACTURES): VITD: 34.96 ng/mL (ref 30.00–100.00)

## 2019-10-30 LAB — ALKALINE PHOSPHATASE: Alkaline Phosphatase: 162 U/L — ABNORMAL HIGH (ref 39–117)

## 2019-10-30 NOTE — Progress Notes (Signed)
Patient ID: Alexandria Weber, female  DOB: Mar 04, 1966, 54 y.o.   MRN: [id number] Patient Care Team    Relationship Specialty Notifications Start End  Ma Hillock, DO PCP - General Family Medicine  07/27/15   Huel Cote, NP Nurse Practitioner Obstetrics and Gynecology  09/05/16     Chief Complaint  Patient presents with  . Follow-up    Pt is here for recheck on her Vit D. She has been taking the high dose weekly     Subjective: Alexandria Weber is a 54 y.o.  Female  present for f/u on elevated alk phos/low vit d/calcium and elevated PTH After incidental find of new elevation in alk phos at CPE> further evaluation was completed and pt was found to have rather significantly low vit d (10), elevated PTH and alk phos remained mildly elevated. She has started the 50K once weekly vit d and OTC ca/vitd supplement 1200/1000u qd. She has not experienced any fractures to report or bone pain. She has noticed since starting the vit d she has had more trouble falling asleep.  Prior note: Patient was found to have an elevated alk phos on her complete physical labs on 06/24/2019 with an alk phos of 126.  Patient presents today to discuss abnormal lab and follow-up for repeat labs.  She does not have a history of vitamin D deficiency, parathyroid disorder or liver disease.  She denies any nausea, vomit or right upper quadrant discomfort.  She has no medication changes.   Depression screen [facility] 2/9 06/24/2019 12/11/2018 06/12/2018 12/20/2017 10/10/2017  Decreased Interest 0 0 0 0 0  Down, Depressed, Hopeless 0 0 0 0 0  PHQ - 2 Score 0 0 0 0 0  Altered sleeping 0 1 0 - 0  Tired, decreased energy 0 0 0 - 0  Change in appetite 0 0 0 - 0  Feeling bad or failure about yourself  0 0 0 - 0  Trouble concentrating 0 0 0 - 0  Moving slowly or fidgety/restless 0 0 1 - 0  Suicidal thoughts 0 0 0 - 0  PHQ-9 Score 0 1 1 - 0  Difficult doing work/chores Not difficult at all Not difficult at all Not difficult  at all - Not difficult at all   GAD 7 : Generalized Anxiety Score 06/24/2019 12/11/2018 01/18/2018 12/20/2017  Nervous, Anxious, on Edge 0 0 1 3  Control/stop worrying 0 0 0 0  Worry too much - different things 0 0 0 0  Trouble relaxing 1 0 0 1  Restless 0 0 1 1  Easily annoyed or irritable 0 0 0 0  Afraid - awful might happen 0 0 0 0  Total GAD 7 Score 1 0 2 5  Anxiety Difficulty Not difficult at all Not difficult at all - -    Immunization History  Administered Date(s) Administered  . Influenza Inj Mdck Quad Pf 08/26/2018  . Influenza, Quadrivalent, Recombinant, Inj, Pf 05/18/2019  . Influenza,inj,Quad PF,6+ Mos 07/27/2015, 09/05/2016  . Influenza-Unspecified 06/17/2013, 06/17/2014, 06/05/2017  . PFIZER SARS-COV-2 Vaccination 10/27/2019  . Tdap 01/25/2016  . Zoster Recombinat (Shingrix) 06/12/2018, 10/10/2018     Past Medical History:  Diagnosis Date  . Anxiety   . Depression   . Fibroadenoma of breast    H/O IN LEFT BREAST NO CHANGE PER [place]  . Heart murmur    mild mitral stenosis and aortic stenosis by prior PCP note. Saw Dr. Mauricio Po in 2013-2015.unable to view echo or  records surrounding condition.   . High cholesterol   . Hypertension   . LBBB (left bundle branch block) 2013   saw Dr. Mauricio Po - stable  . Migraine headache   . PMS (premenstrual syndrome)    Allergies  Allergen Reactions  . Penicillins Hives  . Sulfa Antibiotics Hives  . Zoloft [Sertraline Hcl]    Past Surgical History:  Procedure Laterality Date  . CESAREAN SECTION     x2  . TONSILLECTOMY     Family History  Problem Relation Age of Onset  . Heart disease Mother   . Hearing loss Mother   . Heart disease Father   . Hypertension Father   . Stroke Father   . Arthritis Father   . Hypertension Maternal Grandfather   . Hypertension Paternal Grandfather   . Heart disease Paternal Grandfather   . Drug abuse Brother        committed suicide  . Diabetes Mellitus II Maternal Aunt   . Diabetes  Mellitus II Paternal Aunt   . Heart disease Paternal Uncle   . Colon cancer Neg Hx   . Breast cancer Neg Hx    Social History   Social History Narrative   Married. Spouse is name Suezanne Jacquet. 3 children.   High school graduate. Full-time employed. 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   Drinks caffeinated beverages, no tobacco, no alcohol, no recreational drugs.   Wears her seatbelt. Smoke detectors located in the home. Firearms located in the home in a locked case.   Feels safe in her relationship.    Allergies as of 10/30/2019      Reactions   Penicillins Hives   Sulfa Antibiotics Hives   Zoloft [sertraline Hcl]       Medication List       Accurate as of October 30, 2019  8:31 AM. If you have any questions, ask your nurse or doctor.        STOP taking these medications   Blood Pressure Monitoring Kit Stopped by: Howard Pouch, DO   nitrofurantoin (macrocrystal-monohydrate) 100 MG capsule Commonly known as: Macrobid Stopped by: Howard Pouch, DO   Uristat 95 MG tablet Generic drug: phenazopyridine Stopped by: Howard Pouch, DO     TAKE these medications   amLODipine 2.5 MG tablet Commonly known as: NORVASC Take 1 tablet (2.5 mg total) by mouth daily.   atorvastatin 10 MG tablet Commonly known as: LIPITOR Take 1 tablet (10 mg total) by mouth daily.   diazepam 5 MG tablet Commonly known as: VALIUM Take 1 tablet (5 mg total) by mouth every 12 (twelve) hours as needed for anxiety.   venlafaxine XR 150 MG 24 hr capsule Commonly known as: EFFEXOR-XR Take 1 capsule (150 mg total) by mouth daily with breakfast.   Vitamin D (Ergocalciferol) 1.25 MG (50000 UNIT) Caps capsule Commonly known as: DRISDOL Take 1 capsule (50,000 Units total) by mouth every 7 (seven) days.       All past medical history, surgical history, allergies, family history, immunizations andmedications were updated in the EMR today and reviewed under the history and medication portions of their EMR.       ROS: 14 pt review of systems performed and negative (unless mentioned in an HPI)  Objective: BP 123/79 (BP Location: Left Arm, Patient Position: Sitting, Cuff Size: Normal)   Pulse 90   Temp 97.7 F (36.5 C) (Temporal)   Resp 16   Ht 5' 6"  (1.676 m)   Wt 163 lb (73.9 kg)  SpO2 95%   BMI 26.31 kg/m  Gen: Afebrile. No acute distress.  HENT: AT. [place]. Eyes:Pupils Equal Round Reactive to light, Extraocular movements intact,  Conjunctiva without redness, discharge or icterus. Neck/lymp/endocrine: Supple,no lymphadenopathy, no thyromegaly CV: RRR  Skin: no rashes, purpura or petechiae.  Neuro:  Normal gait. PERLA. EOMi. Alert. Oriented x3  Psych: Normal affect, dress and demeanor. Normal speech. Normal thought content and judgment.  No results found for this or any previous visit (from the past 24 hour(s)).  Assessment/plan: Alexandria Weber is a 54 y.o. female present for f/u  Elevated alkaline phosphatase level/vit d def/low calcium/hyperparthyroid -Patient is still asymptomatic.  - continue OTC ca/d 1200/1000u QD. - alk phos - Vitamin D (25 hydroxy) - PTH, Intact and Calcium -Consider GGT/liver ultrasound if alk phos remains elevated> however, likely cause is her vit d. - Early dexa recommended with her above abnormal labs>> will order with her 08/2020 mam.  Pt will be called with lab results and if needed prescription will be provided again of the high dose vit d.    Orders Placed This Encounter  Procedures  . Alkaline phosphatase  . PTH, Intact and Calcium  . Vitamin D (25 hydroxy)   No orders of the defined types were placed in this encounter.   Electronically signed by: Howard Pouch, DO [place]

## 2019-10-30 NOTE — Patient Instructions (Addendum)
Vitamin D Deficiency Vitamin D deficiency is when your body does not have enough vitamin D. Vitamin D is important to your body because:  It helps your body use other minerals.  It helps to keep your bones strong and healthy.  It may help to prevent some diseases.  It helps your heart and other muscles work well. Not getting enough vitamin D can make your bones soft. It can also cause other health problems. What are the causes? This condition may be caused by:  Not eating enough foods that contain vitamin D.  Not getting enough sun.  Having diseases that make it hard for your body to absorb vitamin D.  Having a surgery in which a part of the stomach or a part of the small intestine is removed.  Having kidney disease or liver disease. What increases the risk? You are more likely to get this condition if:  You are older.  You do not spend much time outdoors.  You live in a nursing home.  You have had broken bones.  You have weak or thin bones (osteoporosis).  You have a disease or condition that changes how your body absorbs vitamin D.  You have dark skin.  You take certain medicines.  You are overweight or obese. What are the signs or symptoms?  In mild cases, there may not be any symptoms. If the condition is very bad, symptoms may include: ? Bone pain. ? Muscle pain. ? Falling often. ? Broken bones caused by a minor injury. How is this treated? Treatment may include taking supplements as told by your doctor. Your doctor will tell you what dose is best for you. Supplements may include:  Vitamin D.  Calcium. Follow these instructions at home: Eating and drinking   Eat foods that contain vitamin D, such as: ? Dairy products, cereals, or juices with added vitamin D. Check the label. ? Fish, such as salmon or trout. ? Eggs. ? Oysters. ? Mushrooms. The items listed above may not be a complete list of what you can eat and drink. Contact a dietitian for more  options. General instructions  Take medicines and supplements only as told by your doctor.  Get regular, safe exposure to natural sunlight.  Do not use a tanning bed.  Maintain a healthy weight. Lose weight if needed.  Keep all follow-up visits as told by your doctor. This is important. How is this prevented?  You can get vitamin D by: ? Eating foods that naturally contain vitamin D. ? Eating or drinking products that have vitamin D added to them, such as cereals, juices, and milk. ? Taking vitamin D or a multivitamin that contains vitamin D. ? Being in the sun. Your body makes vitamin D when your skin is exposed to sunlight. Your body changes the sunlight into a form of the vitamin that it can use. Contact a doctor if:  Your symptoms do not go away.  You feel sick to your stomach (nauseous).  You throw up (vomit).  You poop less often than normal, or you have trouble pooping (constipation). Summary  Vitamin D deficiency is when your body does not have enough vitamin D.  Vitamin D helps to keep your bones strong and healthy.  This condition is often treated by taking a supplement.  Your doctor will tell you what dose is best for you. This information is not intended to replace advice given to you by your health care provider. Make sure you discuss any questions you   have with your health care provider. Document Revised: 04/01/2018 Document Reviewed: 04/01/2018 Elsevier Patient Education  2020 Elsevier Inc.  

## 2019-11-03 ENCOUNTER — Telehealth: Payer: Self-pay | Admitting: Family Medicine

## 2019-11-03 DIAGNOSIS — R748 Abnormal levels of other serum enzymes: Secondary | ICD-10-CM

## 2019-11-03 LAB — PTH, INTACT AND CALCIUM
Calcium: 8.8 mg/dL (ref 8.6–10.4)
PTH: 101 pg/mL — ABNORMAL HIGH (ref 14–64)

## 2019-11-03 NOTE — Telephone Encounter (Signed)
Pt was called and given all information, she verbalized understanding. Both appts were made.

## 2019-11-03 NOTE — Telephone Encounter (Signed)
Please inform patient Her alkaline phosphatase levels are still climbing. Her parathyroid hormone is still higher than normal but improving from last blood draw. Her vitamin D is now in normal range.  Continue her current supplement of calcium 1200/1000 units of vitamin D daily.   Since there is an improvement in the vitamin D and the parathyroid hormone levels are starting to improve, I recommend she continue the calcium and vitamin D supplements for now and follow-up in 8 weeks for retest.  We need to ensure the parathyroid hormones continue to drop into normal range and as that occurs we will hopefully see the alkaline phosphatase start dropping down into normal range as well.  Lastly, just to be complete I have placed an order for a lab called a GGT.  Alkaline phosphatase comes from multiple different areas of the body. This particular test will ensure  the elevation in the alkaline phosphatase is not coming from the liver.  If that were the case there could be a secondary disease process that we would need to further evaluate.  This can be lab appointment only this week if possible.

## 2019-11-04 ENCOUNTER — Other Ambulatory Visit: Payer: Self-pay

## 2019-11-04 ENCOUNTER — Ambulatory Visit (INDEPENDENT_AMBULATORY_CARE_PROVIDER_SITE_OTHER): Payer: 59 | Admitting: Family Medicine

## 2019-11-04 DIAGNOSIS — R748 Abnormal levels of other serum enzymes: Secondary | ICD-10-CM

## 2019-11-04 LAB — GAMMA GT: GGT: 61 U/L — ABNORMAL HIGH (ref 7–51)

## 2019-11-05 ENCOUNTER — Telehealth: Payer: Self-pay | Admitting: Family Medicine

## 2019-11-05 NOTE — Telephone Encounter (Signed)
Please inform patient the following information: The GGT lab is very mildly elevated.  Although only mildly elevated, since it is not in normal range the possibility that the elevation in alk phos is secondary to a liver cause. Since it is barely elevated and she has no symptoms which would include right upper quadrant discomfort, nausea, vomiting> I would recommend we continue with the plan to  follow-up with provider in 8 weeks.  We will repeat labs at that time and then decide on further plan. This will allow her body a little longer to adjust to the normal levels of vitamin D and add a calcium, as well as the statin medication.  Hopefully, we will have a better picture in 7 to 8 weeks with other normal labs or clearer picture and possible cause of elevated alk phos.  Now, if she would start to experience any symptoms mentioned above, then I would suggest she follow-up immediately so that we could evaluate her sooner.

## 2019-11-06 NOTE — Telephone Encounter (Signed)
Pt was called and given all information/instructions, she verbalized understanding  

## 2020-01-06 ENCOUNTER — Encounter: Payer: Self-pay | Admitting: Family Medicine

## 2020-01-06 ENCOUNTER — Ambulatory Visit (INDEPENDENT_AMBULATORY_CARE_PROVIDER_SITE_OTHER): Payer: 59 | Admitting: Family Medicine

## 2020-01-06 ENCOUNTER — Other Ambulatory Visit: Payer: Self-pay

## 2020-01-06 VITALS — BP 119/82 | HR 91 | Temp 98.2°F | Resp 17 | Ht 66.0 in | Wt 160.1 lb

## 2020-01-06 DIAGNOSIS — E559 Vitamin D deficiency, unspecified: Secondary | ICD-10-CM

## 2020-01-06 DIAGNOSIS — E782 Mixed hyperlipidemia: Secondary | ICD-10-CM

## 2020-01-06 DIAGNOSIS — E213 Hyperparathyroidism, unspecified: Secondary | ICD-10-CM | POA: Diagnosis not present

## 2020-01-06 DIAGNOSIS — Z79899 Other long term (current) drug therapy: Secondary | ICD-10-CM

## 2020-01-06 DIAGNOSIS — F4000 Agoraphobia, unspecified: Secondary | ICD-10-CM

## 2020-01-06 DIAGNOSIS — F419 Anxiety disorder, unspecified: Secondary | ICD-10-CM | POA: Diagnosis not present

## 2020-01-06 DIAGNOSIS — M436 Torticollis: Secondary | ICD-10-CM

## 2020-01-06 DIAGNOSIS — E663 Overweight: Secondary | ICD-10-CM

## 2020-01-06 DIAGNOSIS — R748 Abnormal levels of other serum enzymes: Secondary | ICD-10-CM

## 2020-01-06 DIAGNOSIS — I10 Essential (primary) hypertension: Secondary | ICD-10-CM

## 2020-01-06 LAB — COMPREHENSIVE METABOLIC PANEL
ALT: 45 U/L — ABNORMAL HIGH (ref 0–35)
AST: 41 U/L — ABNORMAL HIGH (ref 0–37)
Albumin: 4.7 g/dL (ref 3.5–5.2)
Alkaline Phosphatase: 142 U/L — ABNORMAL HIGH (ref 39–117)
BUN: 17 mg/dL (ref 6–23)
CO2: 33 mEq/L — ABNORMAL HIGH (ref 19–32)
Calcium: 9.1 mg/dL (ref 8.4–10.5)
Chloride: 100 mEq/L (ref 96–112)
Creatinine, Ser: 0.76 mg/dL (ref 0.40–1.20)
GFR: 79.94 mL/min (ref >60.00)
Glucose, Bld: 90 mg/dL (ref 70–99)
Potassium: 3.7 mEq/L (ref 3.5–5.1)
Sodium: 139 mEq/L (ref 135–145)
Total Bilirubin: 0.9 mg/dL (ref 0.2–1.2)
Total Protein: 7.1 g/dL (ref 6.0–8.3)

## 2020-01-06 LAB — VITAMIN D 25 HYDROXY (VIT D DEFICIENCY, FRACTURES): VITD: 38.7 ng/mL (ref 30.00–100.00)

## 2020-01-06 LAB — GAMMA GT: GGT: 56 U/L — ABNORMAL HIGH (ref 7–51)

## 2020-01-06 MED ORDER — AMLODIPINE BESYLATE 2.5 MG PO TABS: 2.5000 mg | ORAL_TABLET | Freq: Every day | ORAL | 1 refills | Status: DC

## 2020-01-06 MED ORDER — VENLAFAXINE HCL ER 150 MG PO CP24: 150.0000 mg | ORAL_CAPSULE | Freq: Every day | ORAL | 1 refills | Status: DC

## 2020-01-06 NOTE — Progress Notes (Signed)
Patient ID: Alexandria Weber, female  DOB: 1965/10/25, 54 y.o.   MRN: [id number] Patient Care Team    Relationship Specialty Notifications Start End  Ma Hillock, DO PCP - General Family Medicine  07/27/15   Huel Cote, NP Nurse Practitioner Obstetrics and Gynecology  09/05/16     Chief Complaint  Patient presents with  . Follow-up    Pt doing well with no complaints. Fasting.     Subjective: Alexandria Weber is a 54 y.o.  Female  present for f/u [facility] Essential hypertension/HLD/on statin therapy Pt reports  compliance withamlodipine 2.5 mg QD and Lipitor.Patient denies chest pain, shortness of breath, dizziness or lower extremity edema.  Labs up-to-date 06/23/2020 Diet:High-fiber diet low saturated fat, lower sodium Exercise:Not performing routine exercises PQ:[id number], no history of heart disease and stroke  Torticollis, acquired/Agoraphobia/Anxiety Patient reports compliance with Effexor and Valium.  She rarely takes the Valium now.  She no longer desires Botox injections for her torticollis. .  Indication forcontrolled substance:anxiety-torticollis. Medication and dose:valium 5 mg BID PRN # pills per month:60 Last UDS date:next visit.  Controlledcontract signed (Y/N):[place] Date narcotic database last reviewed (include red flags):01/06/2019  Elevated alk phos: Patient presents today for follow-up on her continued abnormal alkaline phosphatase since November 2020.  Alk phos has slowly declined as the vitamin D and calcium levels have been supplemented into normal range.  Vitamin D had been rather significantly low initially at 10.  In attempting to decipher if alk phos was related to bone or other cause, GGT was found to be mildly elevated as well.  Patient returns today for repeat labs.  She is asymptomatic. Prior note After incidental find of new elevation in alk phos at CPE> further evaluation was completed and pt was found to have rather significantly  low vit d (10), elevated PTH and alk phos remained mildly elevated. She has started the 50K once weekly vit d and OTC ca/vitd supplement 1200/1000u qd. She has not experienced any fractures to report or bone pain. She has noticed since starting the vit d she has had more trouble falling asleep.  Prior note: Patient was found to have an elevated alk phos on her complete physical labs on 06/24/2019 with an alk phos of 126.  Patient presents today to discuss abnormal lab and follow-up for repeat labs.  She does not have a history of vitamin D deficiency, parathyroid disorder or liver disease.  She denies any nausea, vomit or right upper quadrant discomfort.  She has no medication changes.   Depression screen [facility] 2/9 06/24/2019 12/11/2018 06/12/2018 12/20/2017 10/10/2017  Decreased Interest 0 0 0 0 0  Down, Depressed, Hopeless 0 0 0 0 0  PHQ - 2 Score 0 0 0 0 0  Altered sleeping 0 1 0 - 0  Tired, decreased energy 0 0 0 - 0  Change in appetite 0 0 0 - 0  Feeling bad or failure about yourself  0 0 0 - 0  Trouble concentrating 0 0 0 - 0  Moving slowly or fidgety/restless 0 0 1 - 0  Suicidal thoughts 0 0 0 - 0  PHQ-9 Score 0 1 1 - 0  Difficult doing work/chores Not difficult at all Not difficult at all Not difficult at all - Not difficult at all   GAD 7 : Generalized Anxiety Score 06/24/2019 12/11/2018 01/18/2018 12/20/2017  Nervous, Anxious, on Edge 0 0 1 3  Control/stop worrying 0 0 0 0  Worry too much - different  things 0 0 0 0  Trouble relaxing 1 0 0 1  Restless 0 0 1 1  Easily annoyed or irritable 0 0 0 0  Afraid - awful might happen 0 0 0 0  Total GAD 7 Score 1 0 2 5  Anxiety Difficulty Not difficult at all Not difficult at all - -    Immunization History  Administered Date(s) Administered  . Influenza Inj Mdck Quad Pf 08/26/2018  . Influenza, Quadrivalent, Recombinant, Inj, Pf 05/18/2019  . Influenza,inj,Quad PF,6+ Mos 07/27/2015, 09/05/2016  . Influenza-Unspecified 06/17/2013, 06/17/2014,  06/05/2017  . PFIZER SARS-COV-2 Vaccination 10/27/2019, 11/24/2019  . Tdap 01/25/2016  . Zoster Recombinat (Shingrix) 06/12/2018, 10/10/2018     Past Medical History:  Diagnosis Date  . Anxiety   . Depression   . Fibroadenoma of breast    H/O IN LEFT BREAST NO CHANGE PER [place]  . Heart murmur    mild mitral stenosis and aortic stenosis by prior PCP note. Saw Dr. Mauricio Po in 2013-2015.unable to view echo or records surrounding condition.   . High cholesterol   . Hypertension   . LBBB (left bundle branch block) 2013   saw Dr. Mauricio Po - stable  . Migraine headache   . PMS (premenstrual syndrome)    Allergies  Allergen Reactions  . Penicillins Hives  . Sulfa Antibiotics Hives  . Zoloft [Sertraline Hcl]    Past Surgical History:  Procedure Laterality Date  . CESAREAN SECTION     x2  . TONSILLECTOMY     Family History  Problem Relation Age of Onset  . Heart disease Mother   . Hearing loss Mother   . Heart disease Father   . Hypertension Father   . Stroke Father   . Arthritis Father   . Hypertension Maternal Grandfather   . Hypertension Paternal Grandfather   . Heart disease Paternal Grandfather   . Drug abuse Brother        committed suicide  . Diabetes Mellitus II Maternal Aunt   . Diabetes Mellitus II Paternal Aunt   . Heart disease Paternal Uncle   . Colon cancer Neg Hx   . Breast cancer Neg Hx    Social History   Social History Narrative   Married. Spouse is name Suezanne Jacquet. 3 children.   High school graduate. Full-time employed. 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   Drinks caffeinated beverages, no tobacco, no alcohol, no recreational drugs.   Wears her seatbelt. Smoke detectors located in the home. Firearms located in the home in a locked case.   Feels safe in her relationship.    Allergies as of 01/06/2020      Reactions   Penicillins Hives   Sulfa Antibiotics Hives   Zoloft [sertraline Hcl]       Medication List       Accurate as of January 06, 2020 11:59 PM. If  you have any questions, ask your nurse or doctor.        STOP taking these medications   Vitamin D (Ergocalciferol) 1.25 MG (50000 UNIT) Caps capsule Commonly known as: DRISDOL Stopped by: Howard Pouch, DO     TAKE these medications   amLODipine 2.5 MG tablet Commonly known as: NORVASC Take 1 tablet (2.5 mg total) by mouth daily.   atorvastatin 10 MG tablet Commonly known as: LIPITOR Take 1 tablet (10 mg total) by mouth daily.   CALCIUM 1200 PO Take by mouth.   diazepam 5 MG tablet Commonly known as: VALIUM Take 1 tablet (5  mg total) by mouth every 12 (twelve) hours as needed for anxiety.   venlafaxine XR 150 MG 24 hr capsule Commonly known as: EFFEXOR-XR Take 1 capsule (150 mg total) by mouth daily with breakfast.       All past medical history, surgical history, allergies, family history, immunizations andmedications were updated in the EMR today and reviewed under the history and medication portions of their EMR.      ROS: 14 pt review of systems performed and negative (unless mentioned in an HPI)  Objective: BP 119/82 (BP Location: Right Arm, Patient Position: Sitting, Cuff Size: Normal)   Pulse 91   Temp 98.2 F (36.8 C) (Temporal)   Resp 17   Ht _0  (1.676 m)   Wt 160 lb 2 oz (72.6 kg)   SpO2 95%   BMI 25.84 kg/m  Gen: Afebrile. No acute distress.  Nontoxic, very pleasant female. HENT: AT. [place].  Eyes:Pupils Equal Round Reactive to light, Extraocular movements intact,  Conjunctiva without redness, discharge or icterus. Neck/lymp/endocrine: Supple, no lymphadenopathy, no thyromegaly CV: RRR no murmur, no edema Chest: CTAB, no wheeze or crackles Abd: Soft. NTND. BS present.  Skin: No rashes, purpura or petechiae.  Neuro:  Normal gait. PERLA. EOMi. Alert. Oriented x3  Psych: Normal affect, dress and demeanor. Normal speech. Normal thought content and judgment.   No results found for this or any previous visit (from the past 24  hour(s)).  Assessment/plan: Alexandria Weber is a 54 y.o. female present for f/u  Elevated alkaline phosphatase level/vit d def/low calcium/hyperparthyroid/elevated GGT -Patient is still asymptomatic.  - continue OTC ca/d 1200/1000u QD.    -PTH has been elevated since November/2020 however has been slowly declining as her vitamin D and calcium have become normal after supplementing. -Collected alk phos, vitamin D, PTH/calcium cmp and GGT today -If GGT still elevated would need to consider GI referral versus abdominal ultrasound - Early dexa recommended with her above abnormal labs>> will order with her 08/2020 mam.   Essential hypertension/HLD/on statin therapy - Stable - continue to work on higher fiber diet and routine exercise. - Continue amlodipine 2.5 mg daily - Continue atorvastatin 10 mg daily.  Lipids responded well, labs collected 08/26/2019 with normal liver enzymes after starting atorvastatin.  Torticollis, acquired -stable, Does not desire further botox injections. - rarely uses valium. -benzo contract in place.  - [place] reviewed6/08/2019  Agoraphobia/Anxiety - stable.  - [facility] database reviewed6/08/2019 -Continue effexor 150 mg daily -Continue Valium 5 mg twice daily as needed  Follow-up in 5 and half months on chronic medical conditions, sooner if laboratory results indicate need.  Orders Placed This Encounter  Procedures  . Gamma GT  . Comp Met (CMET)  . PTH, Intact and Calcium  . Vitamin D (25 hydroxy)   Meds ordered this encounter  Medications  . amLODipine (NORVASC) 2.5 MG tablet    Sig: Take 1 tablet (2.5 mg total) by mouth daily.    Dispense:  90 tablet    Refill:  1  . venlafaxine XR (EFFEXOR-XR) 150 MG 24 hr capsule    Sig: Take 1 capsule (150 mg total) by mouth daily with breakfast.    Dispense:  90 capsule    Refill:  1  . diazepam (VALIUM) 5 MG tablet    Sig: Take 1 tablet (5 mg total) by mouth every 12 (twelve) hours as needed  for anxiety.    Dispense:  60 tablet    Refill:  5    Electronically  signed by: Howard Pouch, DO [place]

## 2020-01-06 NOTE — Patient Instructions (Signed)
Please schedule physical 11/22 (week of).  I have refilled your meds for you today.  We will call you with lab results and discuss further plan if needed.

## 2020-01-07 MED ORDER — DIAZEPAM 5 MG PO TABS: 5.0000 mg | ORAL_TABLET | Freq: Two times a day (BID) | ORAL | 5 refills | Status: DC | PRN

## 2020-01-08 ENCOUNTER — Telehealth: Payer: Self-pay | Admitting: Family Medicine

## 2020-01-08 DIAGNOSIS — R748 Abnormal levels of other serum enzymes: Secondary | ICD-10-CM

## 2020-01-08 DIAGNOSIS — R7989 Other specified abnormal findings of blood chemistry: Secondary | ICD-10-CM

## 2020-01-08 LAB — PTH, INTACT AND CALCIUM
Calcium: 9.1 mg/dL (ref 8.6–10.4)
PTH: 81 pg/mL — ABNORMAL HIGH (ref 14–64)

## 2020-01-08 NOTE — Telephone Encounter (Signed)
Please inform patient: Her alk phos is mildly lower than from last check at 142, was 162.  However this level is still abnormal and elevated.  This means her alkaline phosphatase has been elevated for approximately 8 months. Her liver enzymes AST/ALT are also now mildly elevated.  This is a new finding for her. The GGT test, she is specific to the liver, is a little lower at 51-however this is still slightly above normal.  Her parathyroid hormone continues to decrease near normal.  Is currently 81, was 101.  Her vitamin D has been maintained at 38, prior was 34.  I believe her parathyroid continue to drop until it reaches normal range now that her vitamin D and calcium's are in normal range.   Since her alk phos has been high for so long without returning to normal, positive GGT x2 and now new elevated liver enzymes -  she would benefit from abdominal ultrasound.  This has been ordered for her today and they will call her to schedule.  Once I receive those the results we will discuss further plan.

## 2020-01-08 NOTE — Telephone Encounter (Signed)
Pt was called and VM was left to return call  °

## 2020-01-09 NOTE — Telephone Encounter (Signed)
Pt was called and given information, she verbalized understanding  

## 2020-01-21 ENCOUNTER — Ambulatory Visit
Admission: RE | Admit: 2020-01-21 | Discharge: 2020-01-21 | Disposition: A | Discharge status: Home / Self Care | Payer: 59 | Source: Ambulatory Visit | Attending: Family Medicine | Admitting: Family Medicine

## 2020-01-21 DIAGNOSIS — R748 Abnormal levels of other serum enzymes: Secondary | ICD-10-CM

## 2020-01-21 DIAGNOSIS — R7989 Other specified abnormal findings of blood chemistry: Secondary | ICD-10-CM

## 2020-01-22 ENCOUNTER — Telehealth: Payer: Self-pay | Admitting: Family Medicine

## 2020-01-22 DIAGNOSIS — R7989 Other specified abnormal findings of blood chemistry: Secondary | ICD-10-CM

## 2020-01-22 NOTE — Telephone Encounter (Signed)
Pt was called and given all information/results, she verbalized understanding. She does not want to see surgeon at this point and would like to just retest LFT's in 4 weeks, this was scheduled.

## 2020-01-22 NOTE — Addendum Note (Signed)
Addended by: Caroll Rancher L on: 01/22/2020 09:25 AM   Modules accepted: Orders

## 2020-01-22 NOTE — Telephone Encounter (Signed)
Please inform patient She does have stones in her gallbladder.  They are smaller in nature per report.  Gallbladder is not inflamed.  The common bile duct which is shared by the gallbladder and the liver was at a diameter of 5 mm.  Although technically this is still considered normal range (< 5.9 mm normal)-most people's common bile duct is less than 4 mm.  This suggests she may be passing gallstones and having mild obstruction which leads to the elevated alk phos and liver enzymes.  Fortunately, she has been asymptomatic with only laboratory changes appreciated.  It was also incidentally noted she has a 4 mm left kidney stone.  The stone is up in her kidney and is not causing any obstruction.  This is not concerning currently.  It may stay in the kidney indefinitely or at some point it may descend and try to pass.  If she experiences any left flank pain, she should be seen immediately, as it could be a kidney stone trying to pass.   Recommendations: I would recommend she stop the calcium supplement, and attempt to get the 1200 mg of calcium a day by diet.  She does need the calcium for her bone health-but the added calcium supplement may have played a role in the formation of a kidney stone.  Absolutely continue the 1000 units of vitamin D.  Options for the gallstones.  Since she is asymptomatic we can watch and wait.   - If liver enzymes returned to normal and she remains asymptomatic then nothing necessarily has to be done. -If she would like referral to a surgeon to discuss gallbladder removal, we can do that for her today.  If not electing a surgeon, I would recommend repeat LFTs in 4 weeks by lab appointment only (please place order if she desires) to monitor.  And if she becomes symptomatic nausea, vomiting, abdominal pain then she should be seen immediately.  If she would like to discuss all the above in more detail please set her up for an appointment with this provider-can be virtual if she  desires.  Please advise.    

## 2020-02-19 ENCOUNTER — Other Ambulatory Visit: Payer: Self-pay

## 2020-02-19 ENCOUNTER — Ambulatory Visit (INDEPENDENT_AMBULATORY_CARE_PROVIDER_SITE_OTHER): Payer: 59 | Admitting: Family Medicine

## 2020-02-19 DIAGNOSIS — R7989 Other specified abnormal findings of blood chemistry: Secondary | ICD-10-CM | POA: Diagnosis not present

## 2020-02-19 LAB — HEPATIC FUNCTION PANEL
ALT: 19 U/L (ref 0–35)
AST: 22 U/L (ref 0–37)
Albumin: 4.7 g/dL (ref 3.5–5.2)
Alkaline Phosphatase: 125 U/L — ABNORMAL HIGH (ref 39–117)
Bilirubin, Direct: 0.2 mg/dL (ref 0.0–0.3)
Total Bilirubin: 0.9 mg/dL (ref 0.2–1.2)
Total Protein: 7 g/dL (ref 6.0–8.3)

## 2020-02-20 ENCOUNTER — Ambulatory Visit: Payer: 59

## 2020-02-20 ENCOUNTER — Telehealth: Payer: Self-pay | Admitting: Family Medicine

## 2020-02-20 ENCOUNTER — Encounter: Payer: Self-pay | Admitting: Family Medicine

## 2020-02-20 NOTE — Telephone Encounter (Signed)
Please inform patient the following information: LFTs are now normal and the alkaline phosphatase continues to  decrease towards normal.  Alkaline phosphatase is currently 125, normal is below 117.    Everything is looking good and going in the direction it should.  I do not believe any further evaluation is needed, unless she would become symptomatic.  Signs of gallstones include nausea, vomiting, upper abdominal pain etc.

## 2020-02-20 NOTE — Telephone Encounter (Signed)
Pt was called and given lab results. She verbalized understanding.  

## 2020-03-02 IMAGING — MG DIGITAL SCREENING BILAT W/ TOMO W/ CAD
8 series · 8 of 24 positions shown · non-contrast
Comparison: Previous exam(s).

CLINICAL DATA: Screening.

EXAM:
DIGITAL SCREENING BILATERAL MAMMOGRAM WITH TOMO AND CAD

[R CC synth-2D]
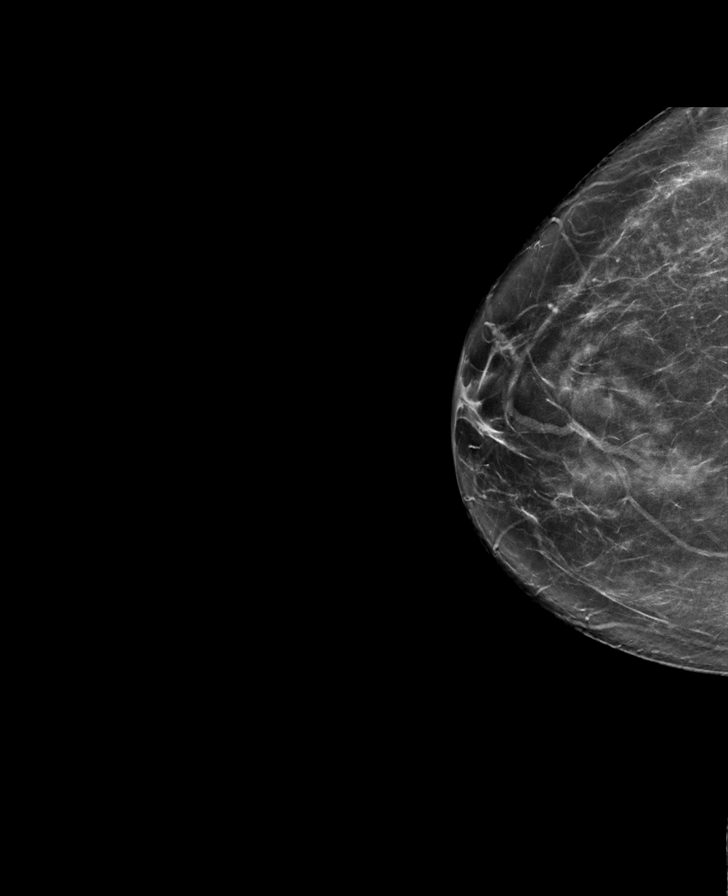

[R MLO synth-2D]
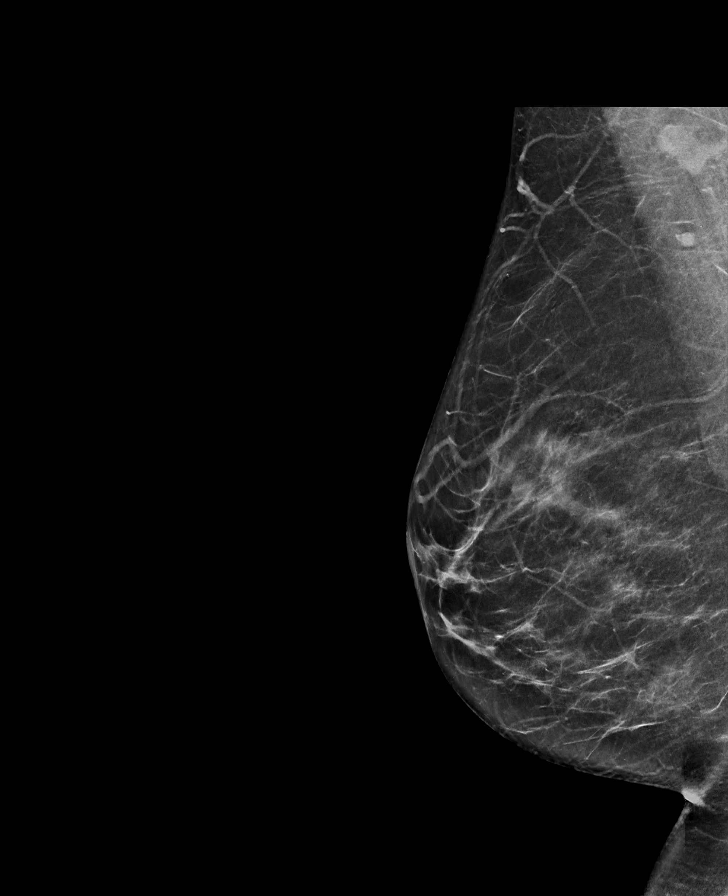

[L CC synth-2D]
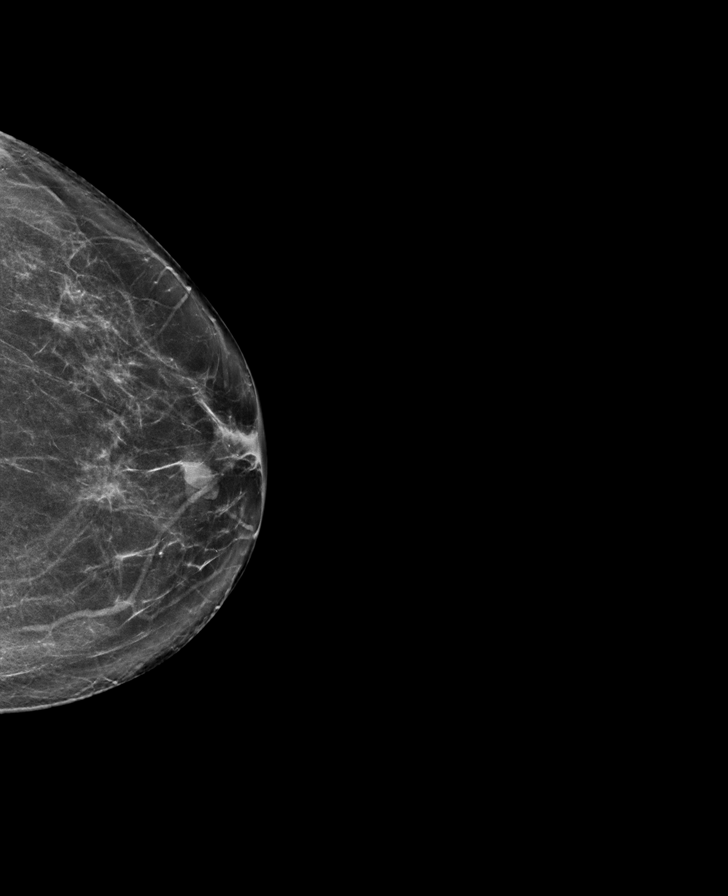

[L MLO synth-2D]
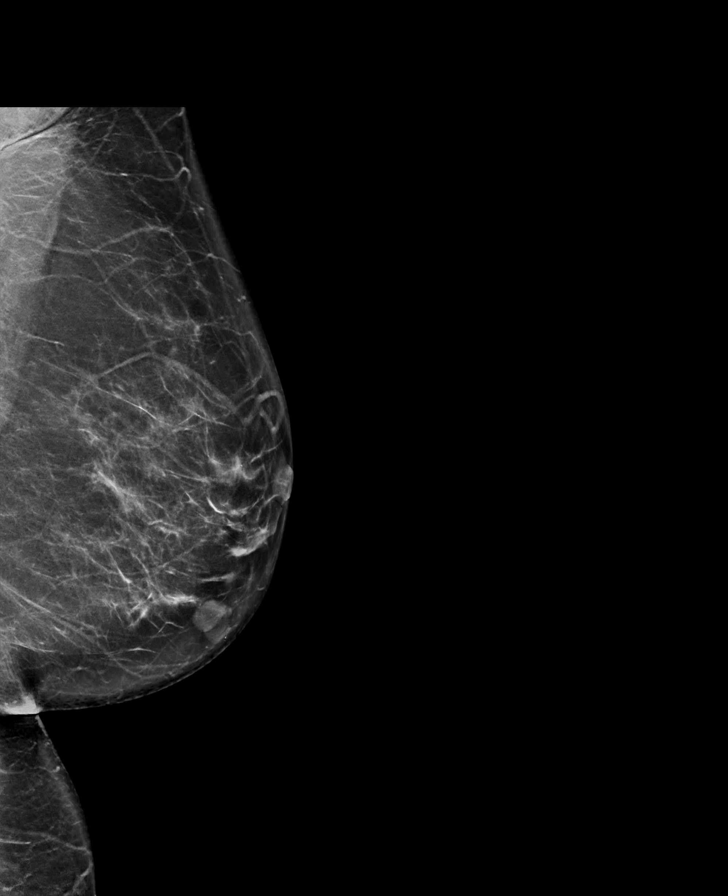

[L CC tomo · tomo slice 37/72.0]
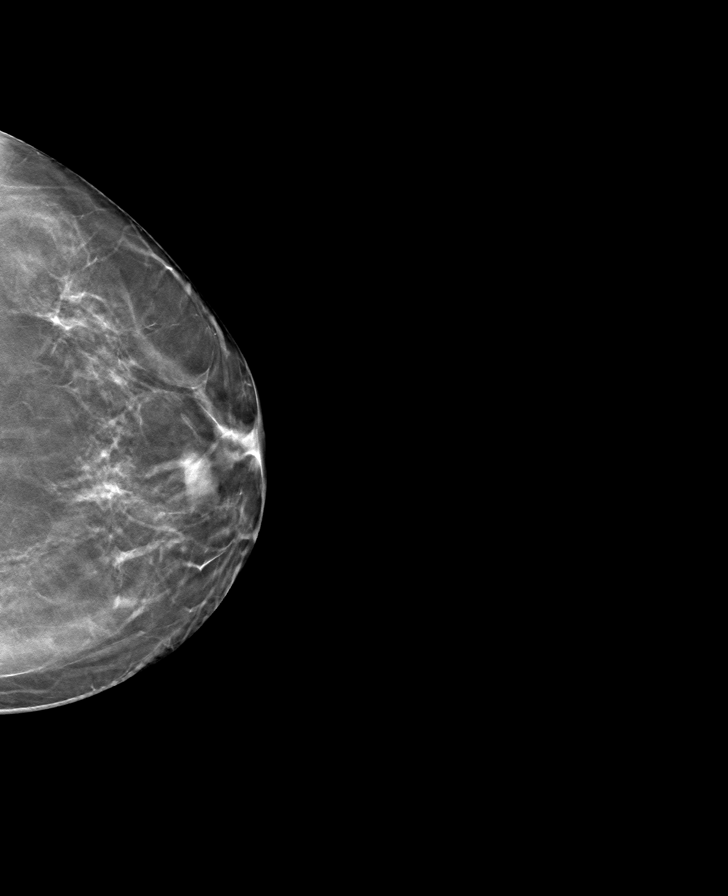

[R CC tomo · tomo slice 37/73.0]
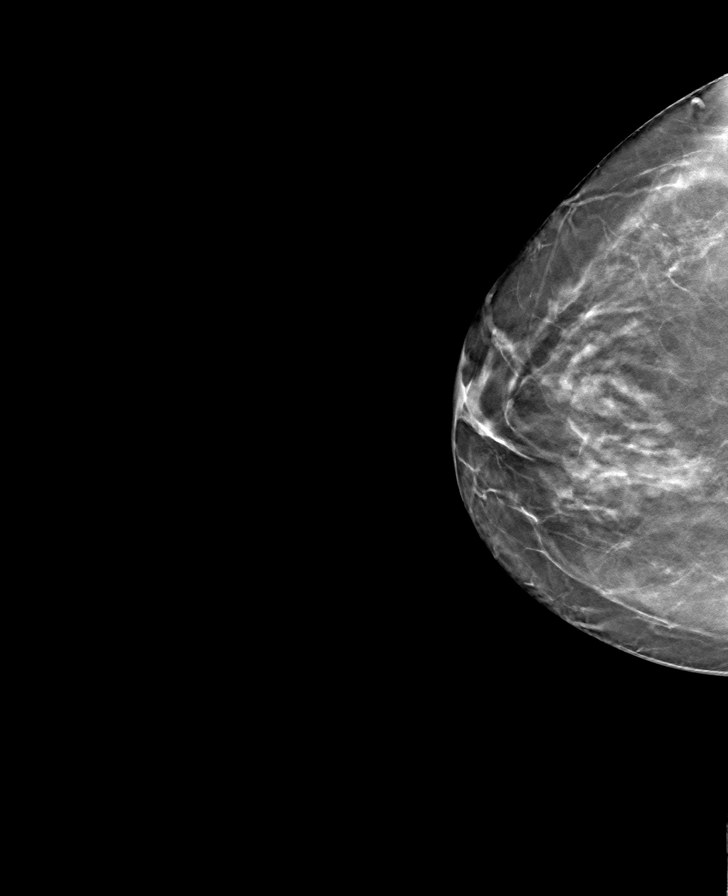

[R MLO tomo · tomo slice 36/71.0]
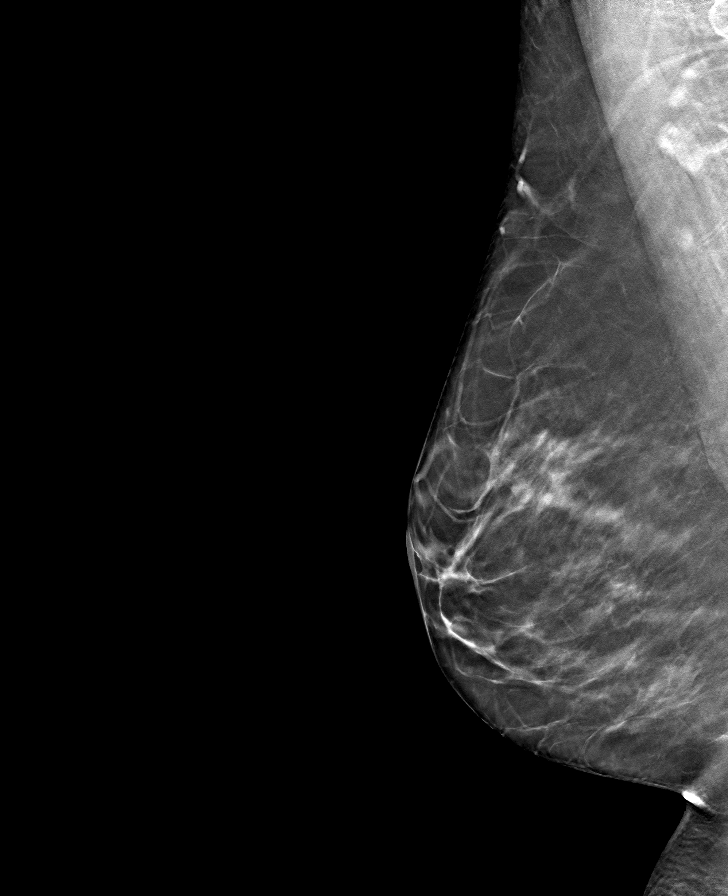

[L MLO tomo · tomo slice 39/77.0]
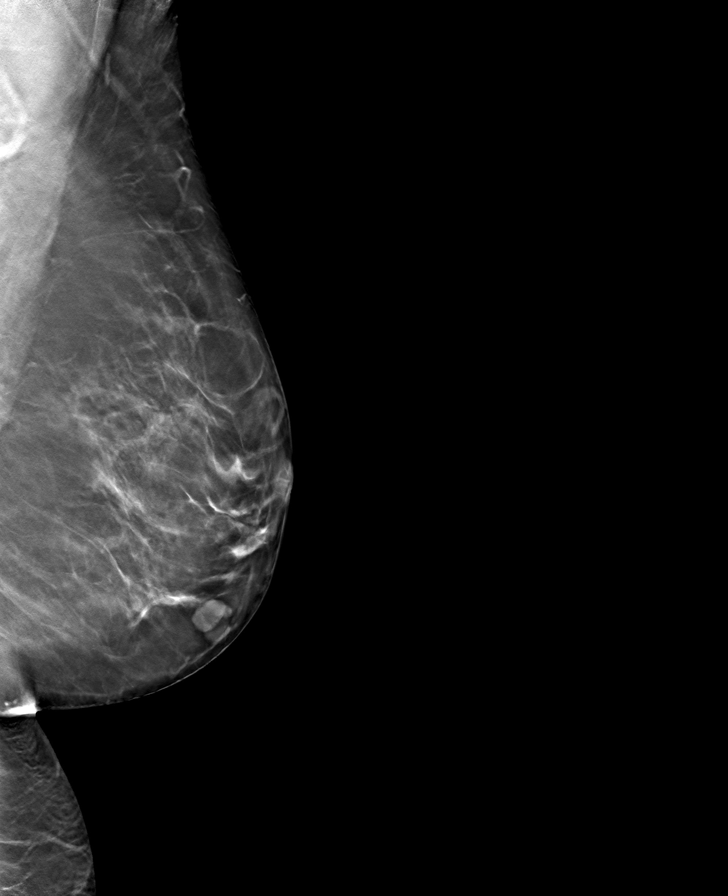

[8 of 24 positions shown; findings below may reference images not displayed]

ACR Breast Density Category b: There are scattered areas of
fibroglandular density.
FINDINGS: There are no findings suspicious for malignancy. Images were
processed with CAD.
IMPRESSION: No mammographic evidence of malignancy. A result letter of this
screening mammogram will be mailed directly to the patient.

RECOMMENDATION:
Screening mammogram in one year. (Code:[id number])

BI-RADS CATEGORY  1: Negative.

## 2020-06-28 ENCOUNTER — Encounter: Payer: Self-pay | Admitting: Family Medicine

## 2020-06-28 ENCOUNTER — Ambulatory Visit (INDEPENDENT_AMBULATORY_CARE_PROVIDER_SITE_OTHER): Payer: 59 | Admitting: Family Medicine

## 2020-06-28 ENCOUNTER — Other Ambulatory Visit: Payer: Self-pay

## 2020-06-28 VITALS — BP 122/75 | HR 92 | Temp 98.7°F | Ht 64.75 in | Wt 154.0 lb

## 2020-06-28 DIAGNOSIS — Z131 Encounter for screening for diabetes mellitus: Secondary | ICD-10-CM

## 2020-06-28 DIAGNOSIS — Z0000 Encounter for general adult medical examination without abnormal findings: Secondary | ICD-10-CM

## 2020-06-28 DIAGNOSIS — Z0001 Encounter for general adult medical examination with abnormal findings: Secondary | ICD-10-CM | POA: Diagnosis not present

## 2020-06-28 DIAGNOSIS — Z1159 Encounter for screening for other viral diseases: Secondary | ICD-10-CM | POA: Diagnosis not present

## 2020-06-28 DIAGNOSIS — E782 Mixed hyperlipidemia: Secondary | ICD-10-CM

## 2020-06-28 DIAGNOSIS — I447 Left bundle-branch block, unspecified: Secondary | ICD-10-CM

## 2020-06-28 DIAGNOSIS — E559 Vitamin D deficiency, unspecified: Secondary | ICD-10-CM

## 2020-06-28 DIAGNOSIS — Z79899 Other long term (current) drug therapy: Secondary | ICD-10-CM

## 2020-06-28 DIAGNOSIS — I10 Essential (primary) hypertension: Secondary | ICD-10-CM | POA: Diagnosis not present

## 2020-06-28 DIAGNOSIS — F4000 Agoraphobia, unspecified: Secondary | ICD-10-CM | POA: Diagnosis not present

## 2020-06-28 DIAGNOSIS — Z1231 Encounter for screening mammogram for malignant neoplasm of breast: Secondary | ICD-10-CM

## 2020-06-28 DIAGNOSIS — Z23 Encounter for immunization: Secondary | ICD-10-CM | POA: Diagnosis not present

## 2020-06-28 DIAGNOSIS — F419 Anxiety disorder, unspecified: Secondary | ICD-10-CM | POA: Diagnosis not present

## 2020-06-28 DIAGNOSIS — E213 Hyperparathyroidism, unspecified: Secondary | ICD-10-CM

## 2020-06-28 DIAGNOSIS — M436 Torticollis: Secondary | ICD-10-CM

## 2020-06-28 DIAGNOSIS — R748 Abnormal levels of other serum enzymes: Secondary | ICD-10-CM | POA: Diagnosis not present

## 2020-06-28 DIAGNOSIS — E2839 Other primary ovarian failure: Secondary | ICD-10-CM

## 2020-06-28 DIAGNOSIS — I050 Rheumatic mitral stenosis: Secondary | ICD-10-CM

## 2020-06-28 LAB — COMPREHENSIVE METABOLIC PANEL
ALT: 20 U/L (ref 0–35)
AST: 23 U/L (ref 0–37)
Albumin: 4.8 g/dL (ref 3.5–5.2)
Alkaline Phosphatase: 145 U/L — ABNORMAL HIGH (ref 39–117)
BUN: 13 mg/dL (ref 6–23)
CO2: 33 mEq/L — ABNORMAL HIGH (ref 19–32)
Calcium: 9.1 mg/dL (ref 8.4–10.5)
Chloride: 101 mEq/L (ref 96–112)
Creatinine, Ser: 0.78 mg/dL (ref 0.40–1.20)
GFR: 87.34 mL/min (ref >60.00)
Glucose, Bld: 89 mg/dL (ref 70–99)
Potassium: 3.4 mEq/L — ABNORMAL LOW (ref 3.5–5.1)
Sodium: 142 mEq/L (ref 135–145)
Total Bilirubin: 1.1 mg/dL (ref 0.2–1.2)
Total Protein: 7.3 g/dL (ref 6.0–8.3)

## 2020-06-28 LAB — LIPID PANEL
Cholesterol: 138 mg/dL (ref 0–200)
HDL: 67.1 mg/dL (ref >39.00)
LDL Cholesterol: 56 mg/dL (ref 0–99)
NonHDL: 70.75
Total CHOL/HDL Ratio: 2
Triglycerides: 76 mg/dL (ref 0.0–149.0)
VLDL: 15.2 mg/dL (ref 0.0–40.0)

## 2020-06-28 LAB — HEMOGLOBIN A1C: Hgb A1c MFr Bld: 5.6 % (ref 4.6–6.5)

## 2020-06-28 LAB — CBC
HCT: 45 % (ref 36.0–46.0)
Hemoglobin: 15.2 g/dL — ABNORMAL HIGH (ref 12.0–15.0)
MCHC: 33.8 g/dL (ref 30.0–36.0)
MCV: 90.5 fl (ref 78.0–100.0)
Platelets: 304 10*3/uL (ref 150.0–400.0)
RBC: 4.97 Mil/uL (ref 3.87–5.11)
RDW: 12.9 % (ref 11.5–15.5)
WBC: 4.8 10*3/uL (ref 4.0–10.5)

## 2020-06-28 LAB — VITAMIN D 25 HYDROXY (VIT D DEFICIENCY, FRACTURES): VITD: 24.88 ng/mL — ABNORMAL LOW (ref 30.00–100.00)

## 2020-06-28 LAB — TSH: TSH: 1.05 u[IU]/mL (ref 0.35–4.50)

## 2020-06-28 MED ORDER — ATORVASTATIN CALCIUM 10 MG PO TABS
10.0000 mg | ORAL_TABLET | Freq: Every day | ORAL | 3 refills | Status: DC
Start: 2020-06-28 — End: 2021-02-10

## 2020-06-28 MED ORDER — VENLAFAXINE HCL ER 75 MG PO CP24: 75.0000 mg | ORAL_CAPSULE | Freq: Every day | ORAL | 1 refills | Status: DC

## 2020-06-28 MED ORDER — AMLODIPINE BESYLATE 2.5 MG PO TABS: 2.5000 mg | ORAL_TABLET | Freq: Every day | ORAL | 1 refills | Status: DC

## 2020-06-28 NOTE — Patient Instructions (Signed)
Health Maintenance, Female Adopting a healthy lifestyle and getting preventive care are important in promoting health and wellness. Ask your health care provider about:  The right schedule for you to have regular tests and exams.  Things you can do on your own to prevent diseases and keep yourself healthy. What should I know about diet, weight, and exercise? Eat a healthy diet   Eat a diet that includes plenty of vegetables, fruits, low-fat dairy products, and lean protein.  Do not eat a lot of foods that are high in solid fats, added sugars, or sodium. Maintain a healthy weight Body mass index (BMI) is used to identify weight problems. It estimates body fat based on height and weight. Your health care provider can help determine your BMI and help you achieve or maintain a healthy weight. Get regular exercise Get regular exercise. This is one of the most important things you can do for your health. Most adults should:  Exercise for at least 150 minutes each week. The exercise should increase your heart rate and make you sweat (moderate-intensity exercise).  Do strengthening exercises at least twice a week. This is in addition to the moderate-intensity exercise.  Spend less time sitting. Even light physical activity can be beneficial. Watch cholesterol and blood lipids Have your blood tested for lipids and cholesterol at 52 years of age, then have this test every 5 years. Have your cholesterol levels checked more often if:  Your lipid or cholesterol levels are high.  You are older than 52 years of age.  You are at high risk for heart disease. What should I know about cancer screening? Depending on your health history and family history, you may need to have cancer screening at various ages. This may include screening for:  Breast cancer.  Cervical cancer.  Colorectal cancer.  Skin cancer.  Lung cancer. What should I know about heart disease, diabetes, and high blood  pressure? Blood pressure and heart disease  High blood pressure causes heart disease and increases the risk of stroke. This is more likely to develop in people who have high blood pressure readings, are of African descent, or are overweight.  Have your blood pressure checked: ? Every 3-5 years if you are 18-39 years of age. ? Every year if you are 40 years old or older. Diabetes Have regular diabetes screenings. This checks your fasting blood sugar level. Have the screening done:  Once every three years after age 40 if you are at a normal weight and have a low risk for diabetes.  More often and at a younger age if you are overweight or have a high risk for diabetes. What should I know about preventing infection? Hepatitis B If you have a higher risk for hepatitis B, you should be screened for this virus. Talk with your health care provider to find out if you are at risk for hepatitis B infection. Hepatitis C Testing is recommended for:  Everyone born from 1945 through 1965.  Anyone with known risk factors for hepatitis C. Sexually transmitted infections (STIs)  Get screened for STIs, including gonorrhea and chlamydia, if: ? You are sexually active and are younger than 52 years of age. ? You are older than 52 years of age and your health care provider tells you that you are at risk for this type of infection. ? Your sexual activity has changed since you were last screened, and you are at increased risk for chlamydia or gonorrhea. Ask your health care provider if   you are at risk.  Ask your health care provider about whether you are at high risk for HIV. Your health care provider may recommend a prescription medicine to help prevent HIV infection. If you choose to take medicine to prevent HIV, you should first get tested for HIV. You should then be tested every 3 months for as long as you are taking the medicine. Pregnancy  If you are about to stop having your period (premenopausal) and  you may become pregnant, seek counseling before you get pregnant.  Take 400 to 800 micrograms (mcg) of folic acid every day if you become pregnant.  Ask for birth control (contraception) if you want to prevent pregnancy. Osteoporosis and menopause Osteoporosis is a disease in which the bones lose minerals and strength with aging. This can result in bone fractures. If you are 65 years old or older, or if you are at risk for osteoporosis and fractures, ask your health care provider if you should:  Be screened for bone loss.  Take a calcium or vitamin D supplement to lower your risk of fractures.  Be given hormone replacement therapy (HRT) to treat symptoms of menopause. Follow these instructions at home: Lifestyle  Do not use any products that contain nicotine or tobacco, such as cigarettes, e-cigarettes, and chewing tobacco. If you need help quitting, ask your health care provider.  Do not use street drugs.  Do not share needles.  Ask your health care provider for help if you need support or information about quitting drugs. Alcohol use  Do not drink alcohol if: ? Your health care provider tells you not to drink. ? You are pregnant, may be pregnant, or are planning to become pregnant.  If you drink alcohol: ? Limit how much you use to 0-1 drink a day. ? Limit intake if you are breastfeeding.  Be aware of how much alcohol is in your drink. In the [place] one drink equals one 12 oz bottle of beer (355 mL), one 5 oz glass of wine (148 mL), or one 1 oz glass of hard liquor (44 mL). General instructions  Schedule regular health, dental, and eye exams.  Stay current with your vaccines.  Tell your health care provider if: ? You often feel depressed. ? You have ever been abused or do not feel safe at home. Summary  Adopting a healthy lifestyle and getting preventive care are important in promoting health and wellness.  Follow your health care provider's instructions about healthy  diet, exercising, and getting tested or screened for diseases.  Follow your health care provider's instructions on monitoring your cholesterol and blood pressure. This information is not intended to replace advice given to you by your health care provider. Make sure you discuss any questions you have with your health care provider. Document Revised: 07/17/2018 Document Reviewed: 07/17/2018 Elsevier Patient Education  2020 Else[employer] Inc.  

## 2020-06-28 NOTE — Progress Notes (Signed)
This visit occurred during the SARS-CoV-2 public health emergency.  Safety protocols were in place, including screening questions prior to the visit, additional usage of staff PPE, and extensive cleaning of exam room while observing appropriate contact time as indicated for disinfecting solutions.    Patient ID: Alexandria Weber, female  DOB: 04/09/1966, 54 y.o.   MRN: [id number] Patient Care Team    Relationship Specialty Notifications Start End  Ma Hillock, DO PCP - General Family Medicine  07/27/15   Huel Cote, NP (Inactive) Nurse Practitioner Obstetrics and Gynecology  09/05/16     Chief Complaint  Patient presents with  . Annual Exam    pt is fasting    Subjective: Alexandria Weber is a 54 y.o.  Female  present for CPE . All past medical history, surgical history, allergies, family history, immunizations, medications and social history were updated in the electronic medical record today. All recent labs, ED visits and hospitalizations within the last year were reviewed.  Health maintenance:  Colonoscopy: cologuard completed 07/2019- rpt 07/2022 Mammogram: completed:08/21/2019, birads1. No fhx.. [facility] [place]>>ordered Cervical cancer screening: last pap:09/2016, results:WNL, completed by:Dr. Elon Alas Immunizations: tdap6/2017, Influenza provided today.  shingrix series completed. covid series completed Infectious disease screening: HIVcompleted. Declined hep C screen DEXA:ordered today for cit d def, estrogen def and hyperparathyroidism.  Assistive device: none Oxygen HQR:FXJO Patient has a Dental home. Hospitalizations/ED visits: reviewed  Essential hypertension/HLD/on statin therapy Pt reports compliancewithamlodipine 2.5 mg QD and Lipitor.Patient denies chest pain, shortness of breath, dizziness or lower extremity edema.  Diet:High-fiber diet low saturated fat, lower sodium Exercise:Not performing routine exercises IT:[id number], no  history of heart disease and stroke  Torticollis, acquired/Agoraphobia/Anxiety Patient reports complaince with Effexor 150 mg and rarely uses valium. She no longer desires Botox injections for her torticollis.  Indication forcontrolled substance:anxiety-torticollis. She does wonder if effexor is responsible for her eye pressures.  Medication and dose:valium 5 mg BID PRN # pills per month:60 Last UDS date:next visit. Controlledcontract signed (Y/N):Y Date narcotic database last reviewed (include red flags):06/28/2020> pt reports she does not need refills.   Elevated alk phos: Patient presents today for follow-up on her continued abnormal alkaline phosphatase since November 2020.  Alk phos has slowly declined as the vitamin D and calcium levels have been supplemented into normal range.  Vitamin D had been rather significantly low initially at 10.  In attempting to decipher if alk phos was related to bone or other cause, GGT was found to be mildly elevated as well.   She is still asymptomatic.  Depression screen [facility] 2/9 06/28/2020 06/24/2019 12/11/2018 06/12/2018 12/20/2017  Decreased Interest 0 0 0 0 0  Down, Depressed, Hopeless 0 0 0 0 0  PHQ - 2 Score 0 0 0 0 0  Altered sleeping - 0 1 0 -  Tired, decreased energy - 0 0 0 -  Change in appetite - 0 0 0 -  Feeling bad or failure about yourself  - 0 0 0 -  Trouble concentrating - 0 0 0 -  Moving slowly or fidgety/restless - 0 0 1 -  Suicidal thoughts - 0 0 0 -  PHQ-9 Score - 0 1 1 -  Difficult doing work/chores - Not difficult at all Not difficult at all Not difficult at all -   GAD 7 : Generalized Anxiety Score 06/24/2019 12/11/2018 01/18/2018 12/20/2017  Nervous, Anxious, on Edge 0 0 1 3  Control/stop worrying 0 0 0 0  Worry too much - different  things 0 0 0 0  Trouble relaxing 1 0 0 1  Restless 0 0 1 1  Easily annoyed or irritable 0 0 0 0  Afraid - awful might happen 0 0 0 0  Total GAD 7 Score 1 0 2 5  Anxiety Difficulty Not  difficult at all Not difficult at all - -    Immunization History  Administered Date(s) Administered  . Influenza Inj Mdck Quad Pf 08/26/2018  . Influenza, Quadrivalent, Recombinant, Inj, Pf 05/18/2019  . Influenza,inj,Quad PF,6+ Mos 07/27/2015, 09/05/2016, 06/28/2020  . Influenza-Unspecified 06/17/2013, 06/17/2014, 06/05/2017  . PFIZER SARS-COV-2 Vaccination 10/27/2019, 11/24/2019  . Tdap 01/25/2016  . Zoster Recombinat (Shingrix) 06/12/2018, 10/10/2018   Past Medical History:  Diagnosis Date  . Anxiety   . Depression   . Fibroadenoma of breast    H/O IN LEFT BREAST NO CHANGE PER [place]  . Gallstones 2021  . Heart murmur    mild mitral stenosis and aortic stenosis by prior PCP note. Saw Dr. Mauricio Po in 2013-2015.unable to view echo or records surrounding condition.   . High cholesterol   . Hypertension   . Kidney stone   . LBBB (left bundle branch block) 2013   saw Dr. Mauricio Po - stable  . Migraine headache   . PMS (premenstrual syndrome)    Allergies  Allergen Reactions  . Penicillins Hives  . Sulfa Antibiotics Hives  . Zoloft [Sertraline Hcl]    Past Surgical History:  Procedure Laterality Date  . CESAREAN SECTION     x2  . TONSILLECTOMY     Family History  Problem Relation Age of Onset  . Heart disease Mother   . Hearing loss Mother   . Heart disease Father   . Hypertension Father   . Stroke Father   . Arthritis Father   . Hypertension Maternal Grandfather   . Hypertension Paternal Grandfather   . Heart disease Paternal Grandfather   . Drug abuse Brother        committed suicide  . Diabetes Mellitus II Maternal Aunt   . Diabetes Mellitus II Paternal Aunt   . Heart disease Paternal Uncle   . Colon cancer Neg Hx   . Breast cancer Neg Hx    Social History   Social History Narrative   Married. Spouse is name Suezanne Jacquet. 3 children.   High school graduate. Full-time employed. 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   Drinks caffeinated beverages, no tobacco, no alcohol, no  recreational drugs.   Wears her seatbelt. Smoke detectors located in the home. Firearms located in the home in a locked case.   Feels safe in her relationship.    Allergies as of 06/28/2020      Reactions   Penicillins Hives   Sulfa Antibiotics Hives   Zoloft [sertraline Hcl]       Medication List       Accurate as of June 28, 2020  9:10 AM. If you have any questions, ask your nurse or doctor.        STOP taking these medications   CALCIUM 1200 PO Stopped by: Howard Pouch, DO     TAKE these medications   amLODipine 2.5 MG tablet Commonly known as: NORVASC Take 1 tablet (2.5 mg total) by mouth daily.   atorvastatin 10 MG tablet Commonly known as: LIPITOR Take 1 tablet (10 mg total) by mouth daily.   diazepam 5 MG tablet Commonly known as: VALIUM Take 1 tablet (5 mg total) by mouth every 12 (twelve) hours as  needed for anxiety.   venlafaxine XR 75 MG 24 hr capsule Commonly known as: EFFEXOR-XR Take 1 capsule (75 mg total) by mouth daily with breakfast. What changed:   medication strength  how much to take Changed by: Howard Pouch, DO       All past medical history, surgical history, allergies, family history, immunizations andmedications were updated in the EMR today and reviewed under the history and medication portions of their EMR.     No results found for this or any previous visit (from the past 2160 hour(s)).    ROS: 14 pt review of systems performed and negative (unless mentioned in an HPI)  Objective: BP 122/75   Pulse 92   Temp 98.7 F (37.1 C) (Oral)   Ht 5' 4.75" (1.645 m)   Wt 154 lb (69.9 kg)   SpO2 96%   BMI 25.83 kg/m  Gen: Afebrile. No acute distress. Nontoxic in appearance, well-developed, well-nourished,  Pleasant female.  HENT: AT. [place]. Bilateral TM visualized and normal in appearance, normal external auditory canal. MMM, no oral lesions, adequate dentition. Bilateral nares within normal limits. Throat without erythema,  ulcerations or exudates. no Cough on exam, no hoarseness on exam. Eyes:Pupils Equal Round Reactive to light, Extraocular movements intact,  Conjunctiva without redness, discharge or icterus. Neck/lymp/endocrine: Supple,no lymphadenopathy, no thyromegaly CV: RRR no murmur, no edema, +2/4 P posterior tibialis pulses.  Chest: CTAB, no wheeze, rhonchi or crackles. normal Respiratory effort. good Air movement. Abd: Soft. flat. NTND. BS present. no Masses palpated. No hepatosplenomegaly. No rebound tenderness or guarding. Skin: no rashes, purpura or petechiae. Warm and well-perfused. Skin intact. Neuro/Msk:  Normal gait. PERLA. EOMi. Alert. Oriented x3.  Cranial nerves II through XII intact. Muscle strength 5/5 upper/lower extremity. DTRs equal bilaterally. Psych: Normal affect, dress and demeanor. Normal speech. Normal thought content and judgment.   No exam data present  Assessment/plan: Raegyn Renda is a 54 y.o. female present for CPE  Elevated alkaline phosphatase level/vit d def/low calcium/hyperparthyroid/elevated GGT/estrogen deficient -Patient is still asymptomatic.  -PTH has been elevated since November/2020 however has been slowly declining as her vitamin D  becomes normal after supplementing. -collected today:  alk phos, vitamin D, PTH/calcium cmp - Early dexa recommended with her above abnormal labs>> ordered today  Essential hypertension/HLD/on statin therapy -stable.  - continue to work on higher fiber diet and routine exercise. - continue  amlodipine 2.5 mg daily - continue  atorvastatin 10 mg daily.  Pt desires MWF dosing if able after results.  - cbc, cmp, lipid and tsh collected today  Torticollis, acquired -stable.  - Does not desire further botox injections. - rarely uses valium.> declined refill today -benzo contract in place.  - [facility] database reviewed6/08/2019  Agoraphobia/Anxiety - stable. Eye pressures are up> poss related to effexor> decrease dose  today. May need to consider other options for tx.  -decrease effexor to 75 mg QD -Continue Valium 5 mg twice daily as needed Need for influenza vaccination Provided today Diabetes mellitus screening - Hemoglobin A1c  Encounter for screening mammogram for malignant neoplasm of breast - MM 3D SCREEN BREAST BILATERAL; Future Estrogen deficiency - DG Bone Density; Future Encounter for preventive health examination Patient was encouraged to exercise greater than 150 minutes a week. Patient was encouraged to choose a diet filled with fresh fruits and vegetables, and lean meats. AVS provided to patient today for education/recommendation on gender specific health and safety maintenance. Colonoscopy: cologuard completed 07/2019- rpt 07/2022 Mammogram: completed:08/21/2019, birads1. No fhx.. [facility] [place]>>ordered Cervical cancer screening: last pap:09/2016, results:WNL, completed by:Dr. Elon Alas Immunizations: tdap6/2017, Influenza provided today.  shingrix series completed. covid series completed Infectious disease screening: HIVcompleted. Declined hep C screen DEXA:ordered today for cit d def, estrogen def and hyperparathyroidism.   Return in about 23 weeks (around 12/06/2020) for [place] (30 min) and 1 yr cpe.   Orders Placed This Encounter  Procedures  . MM 3D SCREEN BREAST BILATERAL  . DG Bone Density  . Flu Vaccine QUAD 6+ mos PF IM (Fluarix Quad PF)  . Comprehensive metabolic panel  . Hemoglobin A1c  . Lipid panel  . TSH  . PTH, Intact and Calcium  . Vitamin D (25 hydroxy)  . CBC   Meds ordered this encounter  Medications  . venlafaxine XR (EFFEXOR-XR) 75 MG 24 hr capsule    Sig: Take 1 capsule (75 mg total) by mouth daily with breakfast.    Dispense:  90 capsule    Refill:  1  . atorvastatin (LIPITOR) 10 MG tablet    Sig: Take 1 tablet (10 mg total) by mouth daily.    Dispense:  90 tablet    Refill:  3  . amLODipine (NORVASC) 2.5 MG tablet    Sig: Take 1 tablet  (2.5 mg total) by mouth daily.    Dispense:  90 tablet    Refill:  1   Referral Orders  No referral(s) requested today     Electronically signed by: Howard Pouch, [place]

## 2020-06-29 LAB — HEPATITIS C ANTIBODY
Hepatitis C Ab: NONREACTIVE
SIGNAL TO CUT-OFF: 0.01 (ref <1.00)

## 2020-06-29 LAB — PTH, INTACT AND CALCIUM
Calcium: 9.2 mg/dL (ref 8.6–10.4)
PTH: 84 pg/mL — ABNORMAL HIGH (ref 14–64)

## 2020-07-05 ENCOUNTER — Telehealth: Payer: Self-pay | Admitting: Family Medicine

## 2020-07-05 DIAGNOSIS — E213 Hyperparathyroidism, unspecified: Secondary | ICD-10-CM

## 2020-07-05 NOTE — Telephone Encounter (Signed)
Please call patient the last few labs were resulted over the holiday weekend. Hepatitis C screening is negative Parathyroid hormone/PTH is still elevated above normal and about the same as last time at 84.  Since it is still elevated I have referred her to endocrinology to further evaluate cause.  They should be reaching out to her to schedule.

## 2020-07-05 NOTE — Telephone Encounter (Signed)
Spoke with pt regarding labs and instructions.   

## 2020-08-12 ENCOUNTER — Other Ambulatory Visit: Payer: Self-pay

## 2020-08-12 ENCOUNTER — Encounter: Payer: Self-pay | Admitting: Endocrinology

## 2020-08-12 ENCOUNTER — Ambulatory Visit (INDEPENDENT_AMBULATORY_CARE_PROVIDER_SITE_OTHER): Payer: 59 | Admitting: Endocrinology

## 2020-08-12 VITALS — BP 118/82 | HR 72 | Ht 66.0 in | Wt 157.0 lb

## 2020-08-12 DIAGNOSIS — R748 Abnormal levels of other serum enzymes: Secondary | ICD-10-CM

## 2020-08-12 DIAGNOSIS — E213 Hyperparathyroidism, unspecified: Secondary | ICD-10-CM | POA: Diagnosis not present

## 2020-08-12 DIAGNOSIS — E559 Vitamin D deficiency, unspecified: Secondary | ICD-10-CM

## 2020-08-12 MED ORDER — DIALYVITE VITAMIN D 5000 125 MCG (5000 UT) PO CAPS: 5000.0000 [IU] | ORAL_CAPSULE | Freq: Every day | ORAL | 3 refills | Status: DC

## 2020-08-12 NOTE — Patient Instructions (Signed)
Please increase the Vitamin-D to 5000 units per day. Please come back for a follow-up appointment in 6 weeks.  Please do the blood tests a few days prior.

## 2020-08-12 NOTE — Progress Notes (Signed)
Subjective:    Patient ID: Alexandria Weber, female    DOB: [birth year], 55 y.o.   MRN: [id number]  HPI Pt is referred by Dr Raoul Pitch, for hyperparathyroidism.  Pt was noted to have hyperparathyroidism in early 2021.  she has never had osteoporosis, urolithiasis, thyroid probs, or cancer.  Only bony fracture was collarbone, as a child.  she takes a vitamin-D supplement, x 1 year.   Past Medical History:  Diagnosis Date  . Anxiety   . Depression   . Fibroadenoma of breast    H/O IN LEFT BREAST NO CHANGE PER [place]  . Gallstones 2021  . Heart murmur    mild mitral stenosis and aortic stenosis by prior PCP note. Saw Dr. Mauricio Po in 2013-2015.unable to view echo or records surrounding condition.   . High cholesterol   . Hypertension   . Kidney stone   . LBBB (left bundle branch block) 2013   saw Dr. Mauricio Po - stable  . Migraine headache   . PMS (premenstrual syndrome)     Past Surgical History:  Procedure Laterality Date  . CESAREAN SECTION     x2  . TONSILLECTOMY      Social History   Socioeconomic History  . Marital status: Married    Spouse name: Not on file  . Number of children: Not on file  . Years of education: Not on file  . Highest education level: Not on file  Occupational History  . Not on file  Tobacco Use  . Smoking status: Never Smoker  . Smokeless tobacco: Never Used  Vaping Use  . Vaping Use: Never used  Substance and Sexual Activity  . Alcohol use: No  . Drug use: No  . Sexual activity: Yes    Birth control/protection: None  Other Topics Concern  . Not on file  Social History Narrative   Married. Spouse is name Suezanne Jacquet. 3 children.   High school graduate. Full-time employed. 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   Drinks caffeinated beverages, no tobacco, no alcohol, no recreational drugs.   Wears her seatbelt. Smoke detectors located in the home. Firearms located in the home in a locked case.   Feels safe in her relationship.   Social Determinants of Health    Financial Resource Strain: Not on file  Food Insecurity: Not on file  Transportation Needs: Not on file  Physical Activity: Not on file  Stress: Not on file  Social Connections: Not on file  Intimate Partner Violence: Not on file    Current Outpatient Medications on File Prior to Visit  Medication Sig Dispense Refill  . amLODipine (NORVASC) 2.5 MG tablet Take 1 tablet (2.5 mg total) by mouth daily. 90 tablet 1  . atorvastatin (LIPITOR) 10 MG tablet Take 1 tablet (10 mg total) by mouth daily. 90 tablet 3  . venlafaxine XR (EFFEXOR-XR) 75 MG 24 hr capsule Take 1 capsule (75 mg total) by mouth daily with breakfast. 90 capsule 1   No current facility-administered medications on file prior to visit.    Allergies  Allergen Reactions  . Penicillins Hives  . Sulfa Antibiotics Hives  . Zoloft [Sertraline Hcl]     Family History  Problem Relation Age of Onset  . Heart disease Mother   . Hearing loss Mother   . Heart disease Father   . Hypertension Father   . Stroke Father   . Arthritis Father   . Hypertension Maternal Grandfather   . Hypertension Paternal Grandfather   . Heart disease  Paternal Grandfather   . Drug abuse Brother        committed suicide  . Diabetes Mellitus II Maternal Aunt   . Diabetes Mellitus II Paternal Aunt   . Heart disease Paternal Uncle   . Colon cancer Neg Hx   . Breast cancer Neg Hx   . Hyperparathyroidism Neg Hx     BP 118/82   Pulse 72   Ht 5\' 6"  (1.676 m)   Wt 157 lb (71.2 kg)   SpO2 95%   BMI 25.34 kg/m    Review of Systems Denies numbness and muscle cramps.      Objective:   Physical Exam VS: see vs page GEN: no distress HEAD: head: no deformity eyes: no periorbital swelling, no proptosis external nose and ears are normal NECK: supple, thyroid is not enlarged.  CHEST WALL: no kyphosis LUNGS: clear to auscultation CV: reg rate and rhythm, no murmur.  MUSCULOSKELETAL: gait is normal and steady EXTEMITIES: no deformity.  no  leg edema NEURO:  readily moves all 4's.  sensation is intact to touch on all 4's SKIN:  Normal texture and temperature.  No rash or suspicious lesion is visible.   NODES:  None palpable at the neck.   PSYCH: alert, well-oriented.  Does not appear anxious nor depressed.    Lab Results  Component Value Date   TSH 1.05 06/28/2020    Lab Results  Component Value Date   PTH 84 (H) 06/28/2020   CALCIUM 9.1 06/28/2020   CALCIUM 9.2 06/28/2020   25-OH Vit-D=25.      Assessment & Plan:  Hyperparathyroidism: uncertain etiology.  First step is to try to suppress with Vit-D.  Vit-D def: uncontrolled.  Elev AP: uncertain relationship to the above. We'll follow this.    Patient Instructions  Please increase the Vitamin-D to 5000 units per day. Please come back for a follow-up appointment in 6 weeks.  Please do the blood tests a few days prior.

## 2020-08-16 ENCOUNTER — Encounter: Payer: Self-pay | Admitting: Nurse Practitioner

## 2020-08-16 ENCOUNTER — Other Ambulatory Visit: Payer: Self-pay

## 2020-08-16 ENCOUNTER — Ambulatory Visit (INDEPENDENT_AMBULATORY_CARE_PROVIDER_SITE_OTHER): Payer: 59 | Admitting: Nurse Practitioner

## 2020-08-16 ENCOUNTER — Encounter: Payer: Self-pay | Admitting: Obstetrics & Gynecology

## 2020-08-16 VITALS — BP 132/80 | Ht 66.0 in | Wt 157.0 lb

## 2020-08-16 DIAGNOSIS — Z01419 Encounter for gynecological examination (general) (routine) without abnormal findings: Secondary | ICD-10-CM

## 2020-08-16 NOTE — Progress Notes (Signed)
   Zayanna Pundt [birth year] [id number]   History:  55 y.o. G3P0003 presents for annual exam without GYN complaints.  Postmenopausal - no HRT. Last cycle a little over a year ago, occasional hot flashes. Normal pap and mammogram history. History of Vitamin D deficiency, hypoparathyroidism, HTN.   Gynecologic History No LMP recorded. Patient is postmenopausal.   Contraception/Family planning: post menopausal status  Health Maintenance Last Pap: 2018. Results were: normal Last mammogram: 08/21/2019. Results were: normal Last colonoscopy: Never. Cologuard 2020 negative Last Dexa: Never  Past medical history, past surgical history, family history and social history were all reviewed and documented in the EPIC chart.  ROS:  A ROS was performed and pertinent positives and negatives are included.  Exam:  Vitals:   08/16/20 1101  BP: 132/80  Weight: 157 lb (71.2 kg)  Height: 5\' 6"  (1.676 m)   Body mass index is 25.34 kg/m.  General appearance:  Normal Thyroid:  Symmetrical, normal in size, without palpable masses or nodularity. Respiratory  Auscultation:  Clear without wheezing or rhonchi Cardiovascular  Auscultation:  Regular rate, without rubs, murmurs or gallops  Edema/varicosities:  Not grossly evident Abdominal  Soft,nontender, without masses, guarding or rebound.  Liver/spleen:  No organomegaly noted  Hernia:  None appreciated  Skin  Inspection:  Grossly normal   Breasts: Examined lying and sitting.   Right: Without masses, retractions, discharge or axillary adenopathy.   Left: Without masses, retractions, discharge or axillary adenopathy. Gentitourinary   Inguinal/mons:  Normal without inguinal adenopathy  External genitalia:  Normal  BUS/Urethra/Skene's glands:  Normal  Vagina:  Normal  Cervix:  Normal  Uterus:  Normal in size, shape and contour.  Midline and mobile  Adnexa/parametria:     Rt: Without masses or tenderness.   Lt: Without masses or  tenderness.  Anus and perineum: Normal  Digital rectal exam: Normal sphincter tone without palpated masses or tenderness  Assessment/Plan:  55 y.o. G3P0003 for annual exam.   Well female exam with routine gynecological exam - Education provided on SBEs, importance of preventative screenings, current guidelines, high calcium diet, regular exercise, and multivitamin daily. Labs with PCP.   Screening for cervical cancer - Normal Pap history.  Will repeat at 5-year interval per guidelines.  Screening for breast cancer - Normal mammogram history.  Continue annual screenings.  Normal breast exam today.  Screening for colon cancer - Negative cologuard in 2020.   Screening for osteoporosis -PCP ordered DEXA and she plans to get this done with her mammogram this month.  Follow-up in 1 year for annual.   Tamela Gammon [facility], 11:08 AM 08/16/2020

## 2020-08-16 NOTE — Patient Instructions (Signed)

## 2020-09-20 ENCOUNTER — Other Ambulatory Visit: Payer: Self-pay

## 2020-09-21 ENCOUNTER — Ambulatory Visit (INDEPENDENT_AMBULATORY_CARE_PROVIDER_SITE_OTHER): Payer: 59

## 2020-09-21 DIAGNOSIS — E559 Vitamin D deficiency, unspecified: Secondary | ICD-10-CM | POA: Diagnosis not present

## 2020-09-21 DIAGNOSIS — R748 Abnormal levels of other serum enzymes: Secondary | ICD-10-CM | POA: Diagnosis not present

## 2020-09-21 DIAGNOSIS — E213 Hyperparathyroidism, unspecified: Secondary | ICD-10-CM

## 2020-09-21 LAB — VITAMIN D 25 HYDROXY (VIT D DEFICIENCY, FRACTURES): VITD: 40.34 ng/mL (ref 30.00–100.00)

## 2020-09-23 ENCOUNTER — Telehealth (INDEPENDENT_AMBULATORY_CARE_PROVIDER_SITE_OTHER): Payer: 59 | Admitting: Endocrinology

## 2020-09-23 ENCOUNTER — Encounter: Payer: Self-pay | Admitting: Endocrinology

## 2020-09-23 ENCOUNTER — Other Ambulatory Visit: Payer: Self-pay

## 2020-09-23 VITALS — Ht 65.0 in | Wt 157.0 lb

## 2020-09-23 DIAGNOSIS — E213 Hyperparathyroidism, unspecified: Secondary | ICD-10-CM

## 2020-09-23 LAB — PTH, INTACT AND CALCIUM
Calcium: 9.4 mg/dL (ref 8.6–10.4)
PTH: 29 pg/mL (ref 14–64)

## 2020-09-23 NOTE — Progress Notes (Signed)
Subjective:    Patient ID: Alexandria Weber, female    DOB: Jul 29, 1966, 55 y.o.   MRN: [id number]  HPI telehealth visit today via phone x 10 minutes.   Alternatives to telehealth are presented to this patient, and the patient agrees to the telehealth visit. Pt is advised of the cost of the visit, and agrees to this, also.   Patient is at home, and I am at home.   Persons attending the telehealth visit: the patient and I.   Pt returns for f/u of secondary hyperparathyroidism (dx'ed 2021; it resolved with Vit-D supplementation; she also has elev AP).  pt states she feels well in general.  She takes Vit-D as rx'ed.   Past Medical History:  Diagnosis Date  . Anxiety   . Depression   . Fibroadenoma of breast    H/O IN LEFT BREAST NO CHANGE PER [place]  . Gallstones 2021  . Heart murmur    mild mitral stenosis and aortic stenosis by prior PCP note. Saw Dr. Mauricio Po in 2013-2015.unable to view echo or records surrounding condition.   . High cholesterol   . Hypertension   . Kidney stone   . LBBB (left bundle branch block) 2013   saw Dr. Mauricio Po - stable  . Migraine headache   . PMS (premenstrual syndrome)     Past Surgical History:  Procedure Laterality Date  . CESAREAN SECTION     x2  . TONSILLECTOMY      Social History   Socioeconomic History  . Marital status: Married    Spouse name: Not on file  . Number of children: Not on file  . Years of education: Not on file  . Highest education level: Not on file  Occupational History  . Not on file  Tobacco Use  . Smoking status: Never Smoker  . Smokeless tobacco: Never Used  Vaping Use  . Vaping Use: Never used  Substance and Sexual Activity  . Alcohol use: No  . Drug use: No  . Sexual activity: Yes    Birth control/protection: None  Other Topics Concern  . Not on file  Social History Narrative   Married. Spouse is name Suezanne Jacquet. 3 children.   High school graduate. Full-time employed. 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   Drinks  caffeinated beverages, no tobacco, no alcohol, no recreational drugs.   Wears her seatbelt. Smoke detectors located in the home. Firearms located in the home in a locked case.   Feels safe in her relationship.   Social Determinants of Health   Financial Resource Strain: Not on file  Food Insecurity: Not on file  Transportation Needs: Not on file  Physical Activity: Not on file  Stress: Not on file  Social Connections: Not on file  Intimate Partner Violence: Not on file    Current Outpatient Medications on File Prior to Visit  Medication Sig Dispense Refill  . amLODipine (NORVASC) 2.5 MG tablet Take 1 tablet (2.5 mg total) by mouth daily. 90 tablet 1  . atorvastatin (LIPITOR) 10 MG tablet Take 1 tablet (10 mg total) by mouth daily. 90 tablet 3  . Cholecalciferol (DIALYVITE VITAMIN D 5000) 125 MCG (5000 UT) capsule Take 1 capsule (5,000 Units total) by mouth daily. 100 capsule 3  . venlafaxine XR (EFFEXOR-XR) 75 MG 24 hr capsule Take 1 capsule (75 mg total) by mouth daily with breakfast. 90 capsule 1   No current facility-administered medications on file prior to visit.    Allergies  Allergen Reactions  .  Penicillins Hives  . Sulfa Antibiotics Hives  . Zoloft [Sertraline Hcl]     Family History  Problem Relation Age of Onset  . Heart disease Mother   . Hearing loss Mother   . Heart disease Father   . Hypertension Father   . Stroke Father   . Arthritis Father   . Hypertension Maternal Grandfather   . Hypertension Paternal Grandfather   . Heart disease Paternal Grandfather   . Drug abuse Brother        committed suicide  . Diabetes Mellitus II Maternal Aunt   . Diabetes Mellitus II Paternal Aunt   . Heart disease Paternal Uncle   . Colon cancer Neg Hx   . Breast cancer Neg Hx   . Hyperparathyroidism Neg Hx     Ht 5\' 5"  (1.651 m)   Wt 157 lb (71.2 kg)   BMI 26.13 kg/m    Review of Systems Denies numbness and muscle cramps    Objective:   Physical  Exam   Lab Results  Component Value Date   ALT 20 06/28/2020   AST 23 06/28/2020   ALKPHOS 155 (H) 09/21/2020   BILITOT 1.1 06/28/2020   Lab Results  Component Value Date   PTH 29 09/21/2020   CALCIUM 9.4 09/21/2020      Assessment & Plan:  elev AP, liver source.  I advised pt to f/u with Dr Raoul Pitch.   Secondary hyperparathyroidism: well-controlled.  Please continue the same Vit-D I would be happy to see you back here as needed

## 2020-09-23 NOTE — Patient Instructions (Addendum)
Please continue the same Vitamin-D.   i'll let you know when we get the alkaline phosphatase result.   I would be happy to see you back here as needed

## 2020-09-25 LAB — ALKALINE PHOSPHATASE, ISOENZYMES
Alkaline Phosphatase: 155 IU/L — ABNORMAL HIGH (ref 44–121)
BONE FRACTION: 25 % (ref 14–68)
INTESTINAL FRAC.: 0 % (ref 0–18)
LIVER FRACTION: 75 % (ref 18–85)

## 2020-09-28 ENCOUNTER — Ambulatory Visit: Payer: 59

## 2020-12-07 ENCOUNTER — Ambulatory Visit
Admission: RE | Admit: 2020-12-07 | Discharge: 2020-12-07 | Disposition: A | Discharge status: Home / Self Care | Payer: 59 | Source: Ambulatory Visit | Attending: Family Medicine | Admitting: Family Medicine

## 2020-12-07 ENCOUNTER — Other Ambulatory Visit: Payer: Self-pay

## 2020-12-07 DIAGNOSIS — E2839 Other primary ovarian failure: Secondary | ICD-10-CM

## 2020-12-07 DIAGNOSIS — E213 Hyperparathyroidism, unspecified: Secondary | ICD-10-CM

## 2020-12-07 DIAGNOSIS — Z1231 Encounter for screening mammogram for malignant neoplasm of breast: Secondary | ICD-10-CM

## 2020-12-07 DIAGNOSIS — E559 Vitamin D deficiency, unspecified: Secondary | ICD-10-CM

## 2020-12-22 ENCOUNTER — Ambulatory Visit: Payer: 59 | Admitting: Family Medicine

## 2020-12-23 ENCOUNTER — Other Ambulatory Visit: Payer: Self-pay | Admitting: Family Medicine

## 2020-12-23 DIAGNOSIS — F419 Anxiety disorder, unspecified: Secondary | ICD-10-CM

## 2020-12-23 DIAGNOSIS — M436 Torticollis: Secondary | ICD-10-CM

## 2020-12-23 DIAGNOSIS — I10 Essential (primary) hypertension: Secondary | ICD-10-CM

## 2020-12-23 DIAGNOSIS — F4000 Agoraphobia, unspecified: Secondary | ICD-10-CM

## 2021-01-17 ENCOUNTER — Other Ambulatory Visit: Payer: Self-pay | Admitting: Family Medicine

## 2021-01-17 DIAGNOSIS — M436 Torticollis: Secondary | ICD-10-CM

## 2021-01-17 DIAGNOSIS — F4000 Agoraphobia, unspecified: Secondary | ICD-10-CM

## 2021-01-17 DIAGNOSIS — I10 Essential (primary) hypertension: Secondary | ICD-10-CM

## 2021-01-17 DIAGNOSIS — F419 Anxiety disorder, unspecified: Secondary | ICD-10-CM

## 2021-01-21 ENCOUNTER — Other Ambulatory Visit: Payer: Self-pay | Admitting: Family Medicine

## 2021-01-21 DIAGNOSIS — F4000 Agoraphobia, unspecified: Secondary | ICD-10-CM

## 2021-01-21 DIAGNOSIS — M436 Torticollis: Secondary | ICD-10-CM

## 2021-01-21 DIAGNOSIS — F419 Anxiety disorder, unspecified: Secondary | ICD-10-CM

## 2021-02-10 ENCOUNTER — Encounter: Payer: Self-pay | Admitting: Family Medicine

## 2021-02-10 ENCOUNTER — Other Ambulatory Visit: Payer: Self-pay

## 2021-02-10 ENCOUNTER — Ambulatory Visit (INDEPENDENT_AMBULATORY_CARE_PROVIDER_SITE_OTHER): Payer: 59 | Admitting: Family Medicine

## 2021-02-10 VITALS — BP 131/79 | HR 71 | Temp 98.2°F | Ht 65.0 in | Wt 156.0 lb

## 2021-02-10 DIAGNOSIS — F4000 Agoraphobia, unspecified: Secondary | ICD-10-CM

## 2021-02-10 DIAGNOSIS — E559 Vitamin D deficiency, unspecified: Secondary | ICD-10-CM

## 2021-02-10 DIAGNOSIS — E782 Mixed hyperlipidemia: Secondary | ICD-10-CM

## 2021-02-10 DIAGNOSIS — F419 Anxiety disorder, unspecified: Secondary | ICD-10-CM

## 2021-02-10 DIAGNOSIS — I10 Essential (primary) hypertension: Secondary | ICD-10-CM | POA: Diagnosis not present

## 2021-02-10 DIAGNOSIS — Z79899 Other long term (current) drug therapy: Secondary | ICD-10-CM

## 2021-02-10 DIAGNOSIS — M436 Torticollis: Secondary | ICD-10-CM | POA: Diagnosis not present

## 2021-02-10 MED ORDER — AMLODIPINE BESYLATE 2.5 MG PO TABS: 2.5000 mg | ORAL_TABLET | Freq: Every day | ORAL | 1 refills | Status: DC

## 2021-02-10 MED ORDER — VENLAFAXINE HCL ER 75 MG PO CP24: 75.0000 mg | ORAL_CAPSULE | Freq: Every day | ORAL | 1 refills | Status: DC

## 2021-02-10 MED ORDER — ATORVASTATIN CALCIUM 10 MG PO TABS: 10.0000 mg | ORAL_TABLET | Freq: Every day | ORAL | 3 refills | Status: DC

## 2021-02-10 NOTE — Progress Notes (Signed)
This visit occurred during the SARS-CoV-2 public health emergency.  Safety protocols were in place, including screening questions prior to the visit, additional usage of staff PPE, and extensive cleaning of exam room while observing appropriate contact time as indicated for disinfecting solutions.    Patient ID: Alexandria Weber, female  DOB: Apr 23, 1966, 55 y.o.   MRN: [id number] Patient Care Team    Relationship Specialty Notifications Start End  Ma Hillock, DO PCP - General Family Medicine  07/27/15   Huel Cote, NP (Inactive) Nurse Practitioner Obstetrics and Gynecology  09/05/16     Chief Complaint  Patient presents with   Hypertension    [place]; pt is fasting    Subjective: Alexandria Weber is a 55 y.o.  Female  present for  Essential hypertension/HLD/on statin therapy Pt reports compliance with amlodipine 2.5 mg QD and Lipitor.Patient denies chest pain, shortness of breath, dizziness or lower extremity edema.  Diet: High-fiber diet low saturated fat, lower sodium Exercise: Not performing routine exercises RF: Hypertension, no history of heart disease and stroke   Torticollis, acquired/Agoraphobia/Anxiety Patient reports compliance with Effexor 75 mg and rarely uses valium. She no longer desires Botox injections for her torticollis.    Indication for controlled substance: anxiety-torticollis. She does wonder if effexor is responsible for her eye pressures.  Medication and dose: valium 5 mg BID PRN # pills per month: 60 Last UDS date: next visit.  Controlled contract signed (Y/N): Y Date narcotic database last reviewed (include red flags):02/10/21 pt reports she does not need refills.    Elevated alk phos: Est. Endocrine- resolved with high dose vit d.   Depression screen Alexandria Weber 2/9 02/10/2021 06/28/2020 06/24/2019 12/11/2018 06/12/2018  Decreased Interest 0 0 0 0 0  Down, Depressed, Hopeless 0 0 0 0 0  PHQ - 2 Score 0 0 0 0 0  Altered sleeping 0 - 0 1 0  Tired,  decreased energy 0 - 0 0 0  Change in appetite 0 - 0 0 0  Feeling bad or failure about yourself  0 - 0 0 0  Trouble concentrating 0 - 0 0 0  Moving slowly or fidgety/restless 0 - 0 0 1  Suicidal thoughts 0 - 0 0 0  PHQ-9 Score 0 - 0 1 1  Difficult doing work/chores - - Not difficult at all Not difficult at all Not difficult at all   GAD 7 : Generalized Anxiety Score 02/10/2021 06/24/2019 12/11/2018 01/18/2018  Nervous, Anxious, on Edge 0 0 0 1  Control/stop worrying 0 0 0 0  Worry too much - different things 0 0 0 0  Trouble relaxing 0 1 0 0  Restless 0 0 0 1  Easily annoyed or irritable 0 0 0 0  Afraid - awful might happen 0 0 0 0  Total GAD 7 Score 0 1 0 2  Anxiety Difficulty - Not difficult at all Not difficult at all -    Immunization History  Administered Date(s) Administered   Influenza Inj Mdck Quad Pf 08/26/2018   Influenza, Quadrivalent, Recombinant, Inj, Pf 05/18/2019   Influenza,inj,Quad PF,6+ Mos 07/27/2015, 09/05/2016, 06/28/2020   Influenza-Unspecified 06/17/2013, 06/17/2014, 06/05/2017   PFIZER(Purple Top)SARS-COV-2 Vaccination 10/27/2019, 11/24/2019, 08/20/2020   Tdap 01/25/2016   Zoster Recombinat (Shingrix) 06/12/2018, 10/10/2018   Past Medical History:  Diagnosis Date   Anxiety    Depression    Fibroadenoma of breast    H/O IN LEFT BREAST NO CHANGE PER [place]   Gallstones 2021   Heart murmur  mild mitral stenosis and aortic stenosis by prior PCP note. Saw Dr. Mauricio Po in 2013-2015.unable to view echo or records surrounding condition.    High cholesterol    Hypertension    Kidney stone    LBBB (left bundle branch block) 2013   saw Dr. Mauricio Po - stable   Migraine headache    PMS (premenstrual syndrome)    Allergies  Allergen Reactions   Penicillins Hives   Sulfa Antibiotics Hives   Zoloft [Sertraline Hcl]    Past Surgical History:  Procedure Laterality Date   CESAREAN SECTION     x2   TONSILLECTOMY     Family History  Problem Relation Age of Onset    Heart disease Mother    Hearing loss Mother    Heart disease Father    Hypertension Father    Stroke Father    Arthritis Father    Hypertension Maternal Grandfather    Hypertension Paternal Grandfather    Heart disease Paternal Grandfather    Drug abuse Brother        committed suicide   Diabetes Mellitus II Maternal Aunt    Diabetes Mellitus II Paternal Aunt    Heart disease Paternal Uncle    Colon cancer Neg Hx    Breast cancer Neg Hx    Hyperparathyroidism Neg Hx    Social History   Social History Narrative   Married. Spouse is name Suezanne Jacquet. 3 children.   High school graduate. Full-time employed. 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   Drinks caffeinated beverages, no tobacco, no alcohol, no recreational drugs.   Wears her seatbelt. Smoke detectors located in the home. Firearms located in the home in a locked case.   Feels safe in her relationship.    Allergies as of 02/10/2021       Reactions   Penicillins Hives   Sulfa Antibiotics Hives   Zoloft [sertraline Hcl]         Medication List        Accurate as of February 10, 2021  9:59 AM. If you have any questions, ask your nurse or doctor.          amLODipine 2.5 MG tablet Commonly known as: NORVASC Take 1 tablet (2.5 mg total) by mouth daily.   atorvastatin 10 MG tablet Commonly known as: LIPITOR Take 1 tablet (10 mg total) by mouth daily.   Dialyvite Vitamin D 5000 125 MCG (5000 UT) capsule Generic drug: Cholecalciferol Take 1 capsule (5,000 Units total) by mouth daily.   venlafaxine XR 75 MG 24 hr capsule Commonly known as: EFFEXOR-XR Take 1 capsule (75 mg total) by mouth daily with breakfast.        All past medical history, surgical history, allergies, family history, immunizations andmedications were updated in the EMR today and reviewed under the history and medication portions of their EMR.     No results found for this or any previous visit (from the past 2160 hour(s)).    ROS: 14 pt review of  systems performed and negative (unless mentioned in an HPI)  Objective: BP 131/79   Pulse 71   Temp 98.2 F (36.8 C) (Oral)   Ht 5' 5"  (1.651 m)   Wt 156 lb (70.8 kg)   LMP 09/07/2017   SpO2 98%   BMI 25.96 kg/m  Gen: Afebrile. No acute distress. Nontoxic, pleasant female HENT: AT. [place].  Eyes:Pupils Equal Round Reactive to light, Extraocular movements intact,  Conjunctiva without redness, discharge or icterus. Neck/lymp/endocrine: Supple, no lymphadenopathy  CV: RRR no murmur Chest: CTAB, no wheeze or crackles Neuro: Normal gait. PERLA. EOMi. Alert. Oriented x3 Psych: Normal affect, dress and demeanor. Normal speech. Normal thought content and judgment.    No results found.  Assessment/plan: Jocelin Schuelke is a 55 y.o. female present for CPE  Elevated alkaline phosphatase level/vit d def/low calcium/hyperparthyroid/elevated GGT/estrogen deficient -PTH resolved with HD vit d. Est with endo - only needs seen PRN now.    Essential hypertension/HLD/on statin therapy - stable.  - continue to work on higher fiber diet and routine exercise.  - continue  amlodipine 2.5 mg daily - continue atorvastatin 10 mg daily.  Pt desires MWF dosing if able after results.  - cbc, cmp, lipid and tsh collected today   Torticollis, acquired - stable.   - Does not desire further botox injections.   - rarely uses valium.> does not need refill today.  - benzo contract in place.  - [place] reviewed 02/10/21   Agoraphobia/Anxiety - stable.  - Eye pressures were up> poss related to effexor> decrease dose today. May need to consider other options for tx. If the remain elevated> she hasnot followed up with her Ophth yet.  -continue effexor to 75 mg QD -conintue Valium 5 mg twice daily as needed  Return in about 5 months (around 07/05/2021) for CPE (30 min), [facility] (30 min).   No orders of the defined types were placed in this encounter.  Meds ordered this encounter  Medications    amLODipine (NORVASC) 2.5 MG tablet    Sig: Take 1 tablet (2.5 mg total) by mouth daily.    Dispense:  90 tablet    Refill:  1   venlafaxine XR (EFFEXOR-XR) 75 MG 24 hr capsule    Sig: Take 1 capsule (75 mg total) by mouth daily with breakfast.    Dispense:  90 capsule    Refill:  1   atorvastatin (LIPITOR) 10 MG tablet    Sig: Take 1 tablet (10 mg total) by mouth daily.    Dispense:  90 tablet    Refill:  3    Referral Orders  No referral(s) requested today     Electronically signed by: Howard Pouch, [place]

## 2021-02-10 NOTE — Patient Instructions (Signed)
Great to see you today! Next appt is your physical- end of NOV with fasting labs that day also.   I have refilled your meds.

## 2021-06-02 ENCOUNTER — Ambulatory Visit (INDEPENDENT_AMBULATORY_CARE_PROVIDER_SITE_OTHER): Payer: 59

## 2021-06-02 ENCOUNTER — Other Ambulatory Visit: Payer: Self-pay

## 2021-06-02 DIAGNOSIS — Z23 Encounter for immunization: Secondary | ICD-10-CM | POA: Diagnosis not present

## 2021-06-02 NOTE — Progress Notes (Signed)
Per the orders or Dr. Raoul Pitch pt is here for Flu vaccine pt tolerated vaccine well. Vaccine was given in right deltoid at 1:40p pt tolerated vaccine well. Vaccine given by [place] CMA/CPT

## 2021-07-13 ENCOUNTER — Other Ambulatory Visit: Payer: Self-pay

## 2021-07-13 ENCOUNTER — Encounter: Payer: Self-pay | Admitting: Family Medicine

## 2021-07-13 ENCOUNTER — Ambulatory Visit (INDEPENDENT_AMBULATORY_CARE_PROVIDER_SITE_OTHER): Payer: 59 | Admitting: Family Medicine

## 2021-07-13 VITALS — BP 120/73 | HR 81 | Temp 98.2°F | Ht 65.0 in | Wt 154.0 lb

## 2021-07-13 DIAGNOSIS — Z0001 Encounter for general adult medical examination with abnormal findings: Secondary | ICD-10-CM | POA: Diagnosis not present

## 2021-07-13 DIAGNOSIS — F419 Anxiety disorder, unspecified: Secondary | ICD-10-CM

## 2021-07-13 DIAGNOSIS — E782 Mixed hyperlipidemia: Secondary | ICD-10-CM

## 2021-07-13 DIAGNOSIS — Z131 Encounter for screening for diabetes mellitus: Secondary | ICD-10-CM

## 2021-07-13 DIAGNOSIS — E559 Vitamin D deficiency, unspecified: Secondary | ICD-10-CM

## 2021-07-13 DIAGNOSIS — R748 Abnormal levels of other serum enzymes: Secondary | ICD-10-CM

## 2021-07-13 DIAGNOSIS — I10 Essential (primary) hypertension: Secondary | ICD-10-CM

## 2021-07-13 DIAGNOSIS — M436 Torticollis: Secondary | ICD-10-CM

## 2021-07-13 DIAGNOSIS — Z79899 Other long term (current) drug therapy: Secondary | ICD-10-CM

## 2021-07-13 DIAGNOSIS — K8020 Calculus of gallbladder without cholecystitis without obstruction: Secondary | ICD-10-CM

## 2021-07-13 DIAGNOSIS — R250 Abnormal head movements: Secondary | ICD-10-CM

## 2021-07-13 LAB — CBC WITH DIFFERENTIAL/PLATELET
Basophils Absolute: 0.1 10*3/uL (ref 0.0–0.1)
Basophils Relative: 2.6 % (ref 0.0–3.0)
Eosinophils Absolute: 0.2 10*3/uL (ref 0.0–0.7)
Eosinophils Relative: 3.9 % (ref 0.0–5.0)
HCT: 42.9 % (ref 36.0–46.0)
Hemoglobin: 14.4 g/dL (ref 12.0–15.0)
Lymphocytes Relative: 34.6 % (ref 12.0–46.0)
Lymphs Abs: 1.5 10*3/uL (ref 0.7–4.0)
MCHC: 33.7 g/dL (ref 30.0–36.0)
MCV: 90.4 fl (ref 78.0–100.0)
Monocytes Absolute: 0.3 10*3/uL (ref 0.1–1.0)
Monocytes Relative: 6.6 % (ref 3.0–12.0)
Neutro Abs: 2.2 10*3/uL (ref 1.4–7.7)
Neutrophils Relative %: 52.3 % (ref 43.0–77.0)
Platelets: 305 10*3/uL (ref 150.0–400.0)
RBC: 4.74 Mil/uL (ref 3.87–5.11)
RDW: 12.6 % (ref 11.5–15.5)
WBC: 4.2 10*3/uL (ref 4.0–10.5)

## 2021-07-13 LAB — LIPID PANEL
Cholesterol: 142 mg/dL (ref 0–200)
HDL: 68.7 mg/dL (ref >39.00)
LDL Cholesterol: 57 mg/dL (ref 0–99)
NonHDL: 73.63
Total CHOL/HDL Ratio: 2
Triglycerides: 85 mg/dL (ref 0.0–149.0)
VLDL: 17 mg/dL (ref 0.0–40.0)

## 2021-07-13 LAB — COMPREHENSIVE METABOLIC PANEL
ALT: 26 U/L (ref 0–35)
AST: 27 U/L (ref 0–37)
Albumin: 4.6 g/dL (ref 3.5–5.2)
Alkaline Phosphatase: 140 U/L — ABNORMAL HIGH (ref 39–117)
BUN: 13 mg/dL (ref 6–23)
CO2: 30 mEq/L (ref 19–32)
Calcium: 9.4 mg/dL (ref 8.4–10.5)
Chloride: 101 mEq/L (ref 96–112)
Creatinine, Ser: 0.76 mg/dL (ref 0.40–1.20)
GFR: 89.45 mL/min (ref >60.00)
Glucose, Bld: 86 mg/dL (ref 70–99)
Potassium: 3.7 mEq/L (ref 3.5–5.1)
Sodium: 140 mEq/L (ref 135–145)
Total Bilirubin: 1.4 mg/dL — ABNORMAL HIGH (ref 0.2–1.2)
Total Protein: 7 g/dL (ref 6.0–8.3)

## 2021-07-13 LAB — TSH: TSH: 1.28 u[IU]/mL (ref 0.35–5.50)

## 2021-07-13 LAB — VITAMIN D 25 HYDROXY (VIT D DEFICIENCY, FRACTURES): VITD: 54.25 ng/mL (ref 30.00–100.00)

## 2021-07-13 LAB — HEMOGLOBIN A1C: Hgb A1c MFr Bld: 5.6 % (ref 4.6–6.5)

## 2021-07-13 MED ORDER — AMLODIPINE BESYLATE 2.5 MG PO TABS: 2.5000 mg | ORAL_TABLET | Freq: Every day | ORAL | 1 refills | Status: DC

## 2021-07-13 MED ORDER — ATORVASTATIN CALCIUM 10 MG PO TABS: 10.0000 mg | ORAL_TABLET | Freq: Every day | ORAL | 3 refills | Status: DC

## 2021-07-13 MED ORDER — BACLOFEN 10 MG PO TABS: 10.0000 mg | ORAL_TABLET | Freq: Three times a day (TID) | ORAL | 1 refills | Status: DC

## 2021-07-13 MED ORDER — VENLAFAXINE HCL ER 75 MG PO CP24: 75.0000 mg | ORAL_CAPSULE | Freq: Every day | ORAL | 1 refills | Status: DC

## 2021-07-13 NOTE — Patient Instructions (Signed)
°Great to see you today.  °I have refilled the medication(s) we provide.  ° °If labs were collected, we will inform you of lab results once received either by echart message or telephone call.  ° - echart message- for normal results that have been seen by the patient already.  ° - telephone call: abnormal results or if patient has not viewed results in their echart. ° °Health Maintenance, Female °Adopting a healthy lifestyle and getting preventive care are important in promoting health and wellness. Ask your health care provider about: °The right schedule for you to have regular tests and exams. °Things you can do on your own to prevent diseases and keep yourself healthy. °What should I know about diet, weight, and exercise? °Eat a healthy diet ° °Eat a diet that includes plenty of vegetables, fruits, low-fat dairy products, and lean protein. °Do not eat a lot of foods that are high in solid fats, added sugars, or sodium. °Maintain a healthy weight °Body mass index (BMI) is used to identify weight problems. It estimates body fat based on height and weight. Your health care provider can help determine your BMI and help you achieve or maintain a healthy weight. °Get regular exercise °Get regular exercise. This is one of the most important things you can do for your health. Most adults should: °Exercise for at least 150 minutes each week. The exercise should increase your heart rate and make you sweat (moderate-intensity exercise). °Do strengthening exercises at least twice a week. This is in addition to the moderate-intensity exercise. °Spend less time sitting. Even light physical activity can be beneficial. °Watch cholesterol and blood lipids °Have your blood tested for lipids and cholesterol at 53 years of age, then have this test every 5 years. °Have your cholesterol levels checked more often if: °Your lipid or cholesterol levels are high. °You are older than 53 years of age. °You are at high risk for heart  disease. °What should I know about cancer screening? °Depending on your health history and family history, you may need to have cancer screening at various ages. This may include screening for: °Breast cancer. °Cervical cancer. °Colorectal cancer. °Skin cancer. °Lung cancer. °What should I know about heart disease, diabetes, and high blood pressure? °Blood pressure and heart disease °High blood pressure causes heart disease and increases the risk of stroke. This is more likely to develop in people who have high blood pressure readings or are overweight. °Have your blood pressure checked: °Every 3-5 years if you are 18-39 years of age. °Every year if you are 40 years old or older. °Diabetes °Have regular diabetes screenings. This checks your fasting blood sugar level. Have the screening done: °Once every three years after age 40 if you are at a normal weight and have a low risk for diabetes. °More often and at a younger age if you are overweight or have a high risk for diabetes. °What should I know about preventing infection? °Hepatitis B °If you have a higher risk for hepatitis B, you should be screened for this virus. Talk with your health care provider to find out if you are at risk for hepatitis B infection. °Hepatitis C °Testing is recommended for: °Everyone born from 1945 through 1965. °Anyone with known risk factors for hepatitis C. °Sexually transmitted infections (STIs) °Get screened for STIs, including gonorrhea and chlamydia, if: °You are sexually active and are younger than 53 years of age. °You are older than 53 years of age and your health care provider   tells you that you are at risk for this type of infection. °Your sexual activity has changed since you were last screened, and you are at increased risk for chlamydia or gonorrhea. Ask your health care provider if you are at risk. °Ask your health care provider about whether you are at high risk for HIV. Your health care provider may recommend a  prescription medicine to help prevent HIV infection. If you choose to take medicine to prevent HIV, you should first get tested for HIV. You should then be tested every 3 months for as long as you are taking the medicine. °Pregnancy °If you are about to stop having your period (premenopausal) and you may become pregnant, seek counseling before you get pregnant. °Take 400 to 800 micrograms (mcg) of folic acid every day if you become pregnant. °Ask for birth control (contraception) if you want to prevent pregnancy. °Osteoporosis and menopause °Osteoporosis is a disease in which the bones lose minerals and strength with aging. This can result in bone fractures. If you are 65 years old or older, or if you are at risk for osteoporosis and fractures, ask your health care provider if you should: °Be screened for bone loss. °Take a calcium or vitamin D supplement to lower your risk of fractures. °Be given hormone replacement therapy (HRT) to treat symptoms of menopause. °Follow these instructions at home: °Alcohol use °Do not drink alcohol if: °Your health care provider tells you not to drink. °You are pregnant, may be pregnant, or are planning to become pregnant. °If you drink alcohol: °Limit how much you have to: °0-1 drink a day. °Know how much alcohol is in your drink. In the [place] one drink equals one 12 oz bottle of beer (355 mL), one 5 oz glass of wine (148 mL), or one 1½ oz glass of hard liquor (44 mL). °Lifestyle °Do not use any products that contain nicotine or tobacco. These products include cigarettes, chewing tobacco, and vaping devices, such as e-cigarettes. If you need help quitting, ask your health care provider. °Do not use street drugs. °Do not share needles. °Ask your health care provider for help if you need support or information about quitting drugs. °General instructions °Schedule regular health, dental, and eye exams. °Stay current with your vaccines. °Tell your health care provider if: °You often  feel depressed. °You have ever been abused or do not feel safe at home. °Summary °Adopting a healthy lifestyle and getting preventive care are important in promoting health and wellness. °Follow your health care provider's instructions about healthy diet, exercising, and getting tested or screened for diseases. °Follow your health care provider's instructions on monitoring your cholesterol and blood pressure. °This information is not intended to replace advice given to you by your health care provider. Make sure you discuss any questions you have with your health care provider. °Document Revised: 12/13/2020 Document Reviewed: 12/13/2020 °Elsevier Patient Education © 2022 [employer] Inc. ° °

## 2021-07-13 NOTE — Progress Notes (Signed)
This visit occurred during the SARS-CoV-2 public health emergency.  Safety protocols were in place, including screening questions prior to the visit, additional usage of staff PPE, and extensive cleaning of exam room while observing appropriate contact time as indicated for disinfecting solutions.    Patient ID: Alexandria Weber, female  DOB: 1965-11-10, 55 y.o.   MRN: [id number] Patient Care Team    Relationship Specialty Notifications Start End  Ma Hillock, DO PCP - General Family Medicine  07/27/15   [facility], [facility]    07/13/21     Chief Complaint  Patient presents with   Annual Exam    Pt is fasting    Subjective: Alexandria Weber is a 55 y.o.  Female  present for CPE/CMC. All past medical history, surgical history, allergies, family history, immunizations, medications and social history were updated in the electronic medical record today. All recent labs, ED visits and hospitalizations within the last year were reviewed.  Health maintenance:  Colonoscopy: cologuard completed 07/2019- rpt 07/2022 Mammogram: completed:12/2020, birads 1. No fhx.. [facility] center [place]ordered Cervical cancer screening: last pap: 09/2016, results: WNL, completed by: Dr. Elon Alas Immunizations: tdap 01/2016, Influenza provided UTD 2022.  shingrix series completed.  covid series completed Infectious disease screening: HIV completed. hep C screen completed DEXA:  vit d def, estrogen def and hyperparathyroidism. > normal. Rpt 98yrAssistive device: no Oxygen use:no Patient has a Dental home. Hospitalizations/ED visits: reviewed  Essential hypertension/HLD/on statin therapy Pt reports compliance with amlodipine 2.5 mg QD and Lipitor. Patient denies chest pain, shortness of breath, dizziness or lower extremity edema.  Diet: High-fiber diet low saturated fat, lower sodium Exercise: Not performing routine exercises RF: Hypertension, no history of heart disease and stroke    Torticollis, acquired/Agoraphobia/Anxiety Patient reports compliance with Effexor 75 mg and rarely uses valium. She had no longer desired Botox injections for her torticollis.   But she is now reconsidering since she has noticed her torticollis has been worsening over the last month along with involuntary horizontal head movements.  Elevated alk phos: Patient presents today for follow-up on her continued abnormal alkaline phosphatase since November 2020.  Alk phos has slowly declined as the vitamin D and calcium levels have been supplemented into normal range.  Vitamin D had been rather significantly low initially at 10.  In attempting to decipher if alk phos was related to bone or other cause, GGT was found to be mildly elevated as well. She is still asymptomatic. Gallstones were present  on [place] Stopped oral calcium and continued vit d.  Depression screen [facility] 02/10/2021 06/28/2020 06/24/2019 12/11/2018 06/12/2018  Decreased Interest 0 0 0 0 0  Down, Depressed, Hopeless 0 0 0 0 0  PHQ - 2 Score 0 0 0 0 0  Altered sleeping 0 - 0 1 0  Tired, decreased energy 0 - 0 0 0  Change in appetite 0 - 0 0 0  Feeling bad or failure about yourself  0 - 0 0 0  Trouble concentrating 0 - 0 0 0  Moving slowly or fidgety/restless 0 - 0 0 1  Suicidal thoughts 0 - 0 0 0  PHQ-9 Score 0 - 0 1 1  Difficult doing work/chores - - Not difficult at all Not difficult at all Not difficult at all   GAD 7 : Generalized Anxiety Score 02/10/2021 06/24/2019 12/11/2018 01/18/2018  Nervous, Anxious, on Edge 0 0 0 1  Control/stop worrying 0 0 0 0  Worry too much - different things  0 0 0 0  Trouble relaxing 0 1 0 0  Restless 0 0 0 1  Easily annoyed or irritable 0 0 0 0  Afraid - awful might happen 0 0 0 0  Total GAD 7 Score 0 1 0 2  Anxiety Difficulty - Not difficult at all Not difficult at all -    Immunization History  Administered Date(s) Administered   Influenza Inj Mdck Quad Pf 08/26/2018   Influenza, Quadrivalent,  Recombinant, Inj, Pf 05/18/2019   Influenza,inj,Quad PF,6+ Mos 07/27/2015, 09/05/2016, 06/28/2020, 06/02/2021   Influenza-Unspecified 06/17/2013, 06/17/2014, 06/05/2017   PFIZER(Purple Top)SARS-COV-2 Vaccination 10/27/2019, 11/24/2019, 08/20/2020   Pfizer Covid-19 Vaccine Bivalent Booster 62yr & up 05/31/2021   Tdap 01/25/2016   Zoster Recombinat (Shingrix) 06/12/2018, 10/10/2018    Past Medical History:  Diagnosis Date   Anxiety    Depression    Fibroadenoma of breast    H/O IN LEFT BREAST NO CHANGE PER [place]   Gallstones 2021   Heart murmur    mild mitral stenosis and aortic stenosis by prior PCP note. Saw Dr. rMauricio Poin 2013-2015.unable to view echo or records surrounding condition.    High cholesterol    [place]   Hypertension    Kidney stone    LBBB (left bundle branch block) 2013   saw Dr. RMauricio Po- stable   Migraine headache    PMS (premenstrual syndrome)    Allergies  Allergen Reactions   Penicillins Hives   Sulfa Antibiotics Hives   Zoloft [Sertraline Hcl]    Past Surgical History:  Procedure Laterality Date   CESAREAN SECTION     x2   TONSILLECTOMY     Family History  Problem Relation Age of Onset   Heart disease Mother    Hearing loss Mother    Heart disease Father    Hypertension Father    Stroke Father    Arthritis Father    Hypertension Maternal Grandfather    Hypertension Paternal Grandfather    Heart disease Paternal Grandfather    Drug abuse Brother        committed suicide   Diabetes Mellitus II Maternal Aunt    Diabetes Mellitus II Paternal Aunt    Heart disease Paternal Uncle    Colon cancer Neg Hx    Breast cancer Neg Hx    Hyperparathyroidism Neg Hx    Social History   Social History Narrative   Married. Spouse is name Alexandria Weber 3 children.   High school graduate. Full-time employed. 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   Drinks caffeinated beverages, no tobacco, no alcohol, no recreational drugs.   Wears her  seatbelt. Smoke detectors located in the home. Firearms located in the home in a locked case.   Feels safe in her relationship.    Allergies as of 07/13/2021       Reactions   Penicillins Hives   Sulfa Antibiotics Hives   Zoloft [sertraline Hcl]         Medication List        Accurate as of July 13, 2021 11:59 PM. If you have any questions, ask your nurse or doctor.          amLODipine 2.5 MG tablet Commonly known as: NORVASC Take 1 tablet (2.5 mg total) by mouth daily.   atorvastatin 10 MG tablet Commonly known as: LIPITOR Take 1 tablet (10 mg total) by mouth daily.   baclofen 10 MG tablet Commonly known as: LIORESAL Take 1 tablet (10 mg total) by  mouth 3 (three) times daily. Started by: Howard Pouch, DO   Dialyvite Vitamin D 5000 125 MCG (5000 UT) capsule Generic drug: Cholecalciferol Take 1 capsule (5,000 Units total) by mouth daily.   venlafaxine XR 75 MG 24 hr capsule Commonly known as: EFFEXOR-XR Take 1 capsule (75 mg total) by mouth daily with breakfast.        All past medical history, surgical history, allergies, family history, immunizations andmedications were updated in the EMR today and reviewed under the history and medication portions of their EMR.     Recent Results (from the past 2160 hour(s))  CBC with Differential/Platelet     Status: None   Collection Time: 07/13/21  9:07 AM  Result Value Ref Range   WBC 4.2 4.0 - 10.5 K/uL   RBC 4.74 3.87 - 5.11 Mil/uL   Hemoglobin 14.4 12.0 - 15.0 g/dL   HCT 42.9 36.0 - 46.0 %   MCV 90.4 78.0 - 100.0 fl   MCHC 33.7 30.0 - 36.0 g/dL   RDW 12.6 11.5 - 15.5 %   Platelets 305.0 150.0 - 400.0 K/uL   Neutrophils Relative % 52.3 43.0 - 77.0 %   Lymphocytes Relative 34.6 12.0 - 46.0 %   Monocytes Relative 6.6 3.0 - 12.0 %   Eosinophils Relative 3.9 0.0 - 5.0 %   Basophils Relative 2.6 0.0 - 3.0 %   Neutro Abs 2.2 1.4 - 7.7 K/uL   Lymphs Abs 1.5 0.7 - 4.0 K/uL   Monocytes Absolute 0.3 0.1 - 1.0 K/uL    Eosinophils Absolute 0.2 0.0 - 0.7 K/uL   Basophils Absolute 0.1 0.0 - 0.1 K/uL  Hemoglobin A1c     Status: None   Collection Time: 07/13/21  9:07 AM  Result Value Ref Range   Hgb A1c MFr Bld 5.6 4.6 - 6.5 %    Comment: Glycemic Control Guidelines for People with Diabetes:Non Diabetic:  <6%Goal of Therapy: <7%Additional Action Suggested:  >8%   Comprehensive metabolic panel     Status: Abnormal   Collection Time: 07/13/21  9:07 AM  Result Value Ref Range   Sodium 140 135 - 145 mEq/L   Potassium 3.7 3.5 - 5.1 mEq/L   Chloride 101 96 - 112 mEq/L   CO2 30 19 - 32 mEq/L   Glucose, Bld 86 70 - 99 mg/dL   BUN 13 6 - 23 mg/dL   Creatinine, Ser 0.76 0.40 - 1.20 mg/dL   Total Bilirubin 1.4 (H) 0.2 - 1.2 mg/dL   Alkaline Phosphatase 140 (H) 39 - 117 U/L   AST 27 0 - 37 U/L   ALT 26 0 - 35 U/L   Total Protein 7.0 6.0 - 8.3 g/dL   Albumin 4.6 3.5 - 5.2 g/dL   GFR 89.45 >60.00 mL/min    Comment: Calculated using the CKD-EPI Creatinine Equation (2021)   Calcium 9.4 8.4 - 10.5 mg/dL  Lipid panel     Status: None   Collection Time: 07/13/21  9:07 AM  Result Value Ref Range   Cholesterol 142 0 - 200 mg/dL    Comment: ATP III Classification       Desirable:  < 200 mg/dL               Borderline High:  200 - 239 mg/dL          High:  > = 240 mg/dL   Triglycerides 85.0 0.0 - 149.0 mg/dL    Comment: Normal:  <150 mg/dLBorderline High:  150 -  199 mg/dL   HDL 68.70 >39.00 mg/dL   VLDL 17.0 0.0 - 40.0 mg/dL   LDL Cholesterol 57 0 - 99 mg/dL   Total CHOL/HDL Ratio 2     Comment:                Men          Women1/2 Average Risk     3.4          3.3Average Risk          5.0          4.42X Average Risk          9.6          7.13X Average Risk          15.0          11.0                       NonHDL 73.63     Comment: NOTE:  Non-HDL goal should be 30 mg/dL higher than patient's LDL goal (i.e. LDL goal of < 70 mg/dL, would have non-HDL goal of < 100 mg/dL)  TSH     Status: None   Collection Time:  07/13/21  9:07 AM  Result Value Ref Range   TSH 1.28 0.35 - 5.50 uIU/mL  VITAMIN D 25 Hydroxy (Vit-D Deficiency, Fractures)     Status: None   Collection Time: 07/13/21  9:07 AM  Result Value Ref Range   VITD 54.25 30.00 - 100.00 ng/mL    MM 3D SCREEN BREAST BILATERAL Result Date: 12/07/2020 RECOMMENDATION: Screening mammogram in one year. (Code:SM-B-01Y) BI-RADS CATEGORY  1: Negative. Electronically Signed   By: Franki Cabot M.D.   On: 12/07/2020 10:28     ROS 14 pt review of systems performed and negative (unless mentioned in an HPI)  Objective:  BP 120/73   Pulse 81   Temp 98.2 F (36.8 C) (Oral)   Ht _0  (1.651 m)   Wt 154 lb (69.9 kg)   LMP 09/07/2017   SpO2 96%   BMI 25.63 kg/m   Physical Exam  Gen: Afebrile. No acute distress. Nontoxic in appearance, well-developed, well-nourished, very pleasant female HENT: AT. [place]. Bilateral TM visualized and normal in appearance, normal external auditory canal. MMM, no oral lesions, adequate dentition. Bilateral nares within normal limits. Throat without erythema, ulcerations or exudates.  No cough on exam, no hoarseness on exam. Eyes:Pupils Equal Round Reactive to light, Extraocular movements intact,  Conjunctiva without redness, discharge or icterus. Neck/lymp/endocrine: Supple, no lymphadenopathy, no thyromegaly CV: RRR no murmur, no edema, +2/4 P posterior tibialis pulses.  Chest: CTAB, no wheeze, rhonchi or crackles.  Normal respiratory effort.  Good air movement. Abd: Soft.  Flat. NTND. BS present.  No masses palpated. No hepatosplenomegaly. No rebound tenderness or guarding. Skin: No rashes, purpura or petechiae. Warm and well-perfused. Skin intact. Neuro/Msk:  Normal gait. PERLA. EOMi. Alert. Oriented x3.  Cranial nerves II through XII intact. Muscle strength 5/5 upper/lower extremity. DTRs equal bilaterally.   -Right torticollis present.  Involuntary right horizontal head movements present. Psych: Normal affect, dress and  demeanor. Normal speech. Normal thought content and judgment.  No results found.  Assessment/plan: Tenecia Ignasiak is a 55 y.o. female present for CPE/CMC Elevated alkaline phosphatase level/vit d def/low calcium/hyperparthyroid/elevated GGT/estrogen deficient Patient is still asymptomatic. She has continue to supplement with vitamin D.  Last parathyroid hormone levels were normal once vitamin D levels normalized.  Gallstones are present on ultrasound and are the most likely cause for her continued elevated alk phos.  She had elected to not pursue surgical consultation as of yet. -collected today: Vitamin D -DEXA up-to-date 12/07/2020-normal   Essential hypertension/HLD/on statin therapy Stable Continue to work on higher fiber diet and routine exercise.  Continue atorvastatin 10 mg daily.  Pt desires MWF dosing if able after results.  CBC, CMP, TSH and lipids collected today.  Patient is fasting   Torticollis, acquired -Worsening over the last month. -She had Botox injections in the past, she had declined to continue, but now is rethinking options since her symptoms are worsening again. -Start baclofen 10 mg 3 times daily. - rarely uses valium.> declined refill today - benzo contract in place.  - [facility] database reviewed today -We discussed referral to Dr. Carles Collet, who is a neurologist that specializes in movement disorders.  She is agreeable to this approach today.   Anxiety -Reports more irritability since decreasing dose last visit.   Eye pressures are up> poss related to effexor> decrease dose t last visit and she has yet to follow-up with her ophthalmologist.  I encouraged her to follow-up with her ophthalmologist soon as possible so we can see if her pressures have improved.  She is more irritable and would likely benefit emotionally to return to the Effexor 150 mg if possible.   We discussed adding medication or changing regimen today to give her more coverage, which she elected to  stay on the same regimen -Continue effexor to 75 mg QD -Continue Valium 5 mg twice daily as needed  Diabetes mellitus screening - Hemoglobin A1c  Encounter for general adult medical examination with abnormal findings Colonoscopy: cologuard completed 07/2019- rpt 07/2022 Mammogram: completed:12/2020, birads 1. No fhx.. [facility] [place]>>ordered Cervical cancer screening: last pap: 09/2016, results: WNL, completed by: Dr. Elon Alas Immunizations: tdap 01/2016, Influenza provided UTD 2022.  shingrix series completed.  covid series completed Infectious disease screening: HIV completed. hep C screen completed DEXA:  vit d def, estrogen def and hyperparathyroidism. > normal. Rpt 42yrPatient was encouraged to exercise greater than 150 minutes a week. Patient was encouraged to choose a diet filled with fresh fruits and vegetables, and lean meats. AVS provided to patient today for education/recommendation on gender specific health and safety maintenance.  Return in about 24 weeks (around 12/28/2021) for CMC (30 min).  Orders Placed This Encounter  Procedures   CBC with Differential/Platelet   Hemoglobin A1c   Comprehensive metabolic panel   Lipid panel   TSH   VITAMIN D 25 Hydroxy (Vit-D Deficiency, Fractures)   Ambulatory referral to Neurology    Meds ordered this encounter  Medications   baclofen (LIORESAL) 10 MG tablet    Sig: Take 1 tablet (10 mg total) by mouth 3 (three) times daily.    Dispense:  90 each    Refill:  1   venlafaxine XR (EFFEXOR-XR) 75 MG 24 hr capsule    Sig: Take 1 capsule (75 mg total) by mouth daily with breakfast.    Dispense:  90 capsule    Refill:  1   atorvastatin (LIPITOR) 10 MG tablet    Sig: Take 1 tablet (10 mg total) by mouth daily.    Dispense:  90 tablet    Refill:  3   amLODipine (NORVASC) 2.5 MG tablet    Sig: Take 1 tablet (2.5 mg total) by mouth daily.    Dispense:  90 tablet    Refill:  1    Referral Orders         Ambulatory referral  to Neurology       Electronically signed by: Howard Pouch, DO [place]

## 2021-07-14 ENCOUNTER — Telehealth: Payer: Self-pay | Admitting: Family Medicine

## 2021-07-14 ENCOUNTER — Encounter: Payer: Self-pay | Admitting: Family Medicine

## 2021-07-14 DIAGNOSIS — R250 Abnormal head movements: Secondary | ICD-10-CM | POA: Insufficient documentation

## 2021-07-14 DIAGNOSIS — R17 Unspecified jaundice: Secondary | ICD-10-CM | POA: Insufficient documentation

## 2021-07-14 DIAGNOSIS — K8020 Calculus of gallbladder without cholecystitis without obstruction: Secondary | ICD-10-CM | POA: Insufficient documentation

## 2021-07-14 HISTORY — DX: Unspecified jaundice: R17

## 2021-07-14 NOTE — Telephone Encounter (Signed)
Spoke with pt regarding labs and instructions.   

## 2021-07-14 NOTE — Telephone Encounter (Signed)
Please call patient kidney and thyroid function are normal Blood cell counts and electrolytes are normal Diabetes screening/A1c is normal  Cholesterol panel looks GREAT! Vitamin D levels are in normal range and look good.  Continue current management. Her liver enzyme-alk phos is still elevated but stable. Her bilirubin is mildly elevated this time at 1.4, normal is less than 1.2.   -Since her vitamin D has been stable her parathyroid hormone returned to normal once vitamin D was stable, I believe the elevated alk phos and now unfortunately the mildly elevated bilirubin secondary to the gallstones that we saw on her ultrasound last year.   -I recommend she return in 4 weeks to have bilirubin repeated, and if lab continues to elevate or she appreciates right upper quadrant pain, then we will have to refer her to the surgeon to discuss gallbladder removal secondary to stone formation.   Please schedule her for repeat labs in approximately 4 weeks-please encourage her to eat before the lab.

## 2021-07-15 NOTE — Progress Notes (Signed)
Assessment/Plan:    Cervical Dystonia  -I talked to the patient about the nature and pathophysiology.  The patient is having trouble with ADL's and with rotation of the head in daily life for driving.  The primary muscles involved are the L SCM, L levator scapulae (small player), L trap (shoulder elevator), R splenius capitus.  We talked about treatments.  We talked about the value of botox.  The patient was educated on the botulinum toxin the black blox warning and given a copy of the botox patient medication guide.  The patient understands that this warning states that there have been reported cases of the Botox extending beyond the injection site and creating adverse effects, similar to those of botulism. This included loss of strength, trouble walking, hoarseness, trouble saying words clearly, loss of bladder control, trouble breathing, trouble swallowing, diplopia, blurry vision and ptosis. Most of the distant spread of Botox was happening in patients, primarily children, who received medication for spasticity or for cervical dystonia. The patient expressed understanding and desire to proceed.  I will try to get authorization.  We did discuss her concern that when she did botox years ago she thought that the wrong m was injected.  I think that it sounds like it was the L SCM that was injected and that sounds like it was appropriate injected.  Discussed that the left sternocleidomastoid actually turns the head to the right.  This is a common misconception amongst patients when I inject them as well.  Did discuss with patient that I would recommend that she give it at least a 26-month trial to see what results are.  She has a fairly significant dystonia and it is my suspicion is going to take a while to get that under control.  She expresses understanding and agrees.  She was given patient literature, including on the Botox savings program and encouraged to use that.   Subjective:   Alexandria Weber  was seen today in neurologic consultation at the request of Kuneff, [place], DO.  The consultation is for the evaluation of cervical dystonia.  Outside records that were made available to me were reviewed.  Records from her primary care back in 2015 indicate that she has "recurrent torticollis to the left."  Notes at that time indicate that patient had been on Lexapro for this and had been tried on baclofen.  Patient apparently was referred to Dr. Nelva Bush in 2017 for Botox for cervical dystonia.  I do not have any of those records.  Patient reports that she did not do Botox for long, having it only 1 time.  She isn't sure it was effective and doesn't think that the right muscles was injected (she points to the L SCM being injected but she feels that trap is the issue).  Head pulls to the right and pulls backward and has sudden jerking.    She ended up using Valium rarely (also being used for agoraphobia and anxiety but hasn't even been filled since 01/2020), along with Effexor.  No history of head or neck trauma.  Estimates that symptoms started about a decade ago.  Neuroimaging has not previously been performed.     ALLERGIES:   Allergies  Allergen Reactions   Penicillins Hives   Sulfa Antibiotics Hives   Zoloft [Sertraline Hcl]     CURRENT MEDICATIONS:  Outpatient Encounter Medications as of 07/19/2021  Medication Sig   amLODipine (NORVASC) 2.5 MG tablet Take 1 tablet (2.5 mg total) by mouth daily.  atorvastatin (LIPITOR) 10 MG tablet Take 1 tablet (10 mg total) by mouth daily.   baclofen (LIORESAL) 10 MG tablet Take 1 tablet (10 mg total) by mouth 3 (three) times daily.   Cholecalciferol (DIALYVITE VITAMIN D 5000) 125 MCG (5000 UT) capsule Take 1 capsule (5,000 Units total) by mouth daily.   venlafaxine XR (EFFEXOR-XR) 75 MG 24 hr capsule Take 1 capsule (75 mg total) by mouth daily with breakfast.   No facility-administered encounter medications on file as of 07/19/2021.    Objective:    PHYSICAL EXAMINATION:    VITALS:   Vitals:   07/19/21 0848  BP: 136/82  Pulse: 95  SpO2: 96%  Weight: 158 lb (71.7 kg)  Height: 5\' 5"  (1.651 m)    GEN:  Normal appears female in no acute distress.  Appears stated age. HEENT:  Normocephalic, atraumatic. The mucous membranes are moist.  Neck/Heme: There are no carotid bruits noted bilaterally.  She has a geste antagoniste  NEUROLOGICAL: Orientation:  The patient is alert and oriented x 3.   Cranial nerves: There is good facial symmetry.  Extraocular muscles are intact and visual fields are full to confrontational testing. Speech is fluent and clear. Hearing is intact to conversational tone. Tone: Tone is good throughout. Sensation: Sensation is intact to light touch  Motor: Strength is 5/5 in the bilateral upper and lower extremities.  Shoulder shrug is equal and symmetric. There is no pronator drift.  There are no fasciculations noted. Gait and Station: The patient is able to ambulate without difficulty.  Abnormal movements: Patient's head is turned to the right with hypertrophy of the left sternocleidomastoid.  There is slight shoulder elevation on the left.  There is an irregular/jerky head tremor.  There is strong head pull to the right.    Total time spent on today's visit was 45 minutes, including both face-to-face time and nonface-to-face time.  Time included that spent on review of records (prior notes available to me/labs/imaging if pertinent), discussing treatment and goals, answering patient's questions and coordinating care.   Cc:  Kuneff, Renee A, DO

## 2021-07-19 ENCOUNTER — Other Ambulatory Visit: Payer: Self-pay

## 2021-07-19 ENCOUNTER — Telehealth: Payer: Self-pay

## 2021-07-19 ENCOUNTER — Ambulatory Visit (INDEPENDENT_AMBULATORY_CARE_PROVIDER_SITE_OTHER): Payer: 59 | Admitting: Neurology

## 2021-07-19 ENCOUNTER — Encounter: Payer: Self-pay | Admitting: Neurology

## 2021-07-19 VITALS — BP 136/82 | HR 95 | Ht 65.0 in | Wt 158.0 lb

## 2021-07-19 DIAGNOSIS — G243 Spasmodic torticollis: Secondary | ICD-10-CM | POA: Insufficient documentation

## 2021-07-19 MED ORDER — BOTOX 100 UNITS IJ SOLR: INTRAMUSCULAR | 0 refills | Status: DC

## 2021-07-19 NOTE — Telephone Encounter (Signed)
New message   I received a hard stop with CoverMyMeds initiating a prior authorization.   I called the patient's insurance and advised the patient's insurance plan is active in 2023, with no insurance active plan for 2022.  The patient is aware and voiced she has been having a problem with her insurance as well, advise her I will keep her on my list to do for next year unless something changes before then.   The patient called back and spoke with her insurance company and was advised her insurance is still active for 2022.   Alexandria Weber Key: [id number] help? Call us at [phone] Status New(Not sent to plan) Cannot find matching patient with Name and Date of Birth provided. For additional information, please contact the phone number on the back of the member prescription ID card. Drug Botox 100UNIT solution Form [employer] Electronic Prior Authorization Form (2017 NCPDP)

## 2021-07-22 NOTE — Telephone Encounter (Signed)
F/U  Website [facility]   Benefit Verification [id number] Submitted!  For BV Basic submissions, please allow 24 hours for results.  For BV Full submissions, please allow 24-48 hours for results.

## 2021-07-25 ENCOUNTER — Other Ambulatory Visit: Payer: Self-pay

## 2021-07-25 DIAGNOSIS — G243 Spasmodic torticollis: Secondary | ICD-10-CM

## 2021-07-25 MED ORDER — BOTOX 100 UNITS IJ SOLR: INTRAMUSCULAR | 0 refills | Status: DC

## 2021-07-25 NOTE — Telephone Encounter (Signed)
New message   Ref # [id number]  Call [facility] [facility] at [phone] to obtain prior authorization  Spoke with Vaughan Basta prior authorization approved under [id number]   Approval-- [id number]  Effective date  07/25/2021 to 01/23/2022.  Botox 300 unit   Specialty Pharmacy [facility]

## 2021-08-04 ENCOUNTER — Other Ambulatory Visit: Payer: Self-pay | Admitting: Family Medicine

## 2021-08-12 NOTE — Telephone Encounter (Signed)
F/u  BOTOX (onabotulinumtoxinA)  ACQUISITION - [place] List of Specialty Pharmacies may not include all available options. If preferred Specialty Pharmacy is not listed, check with payer.  _0  Buy and Bill [?]Buy and [place] Available _1  Specialty Pharmacy Required _2  Prescription Coverage Only - See Pharmacy Benefits Section _3  Payer will not release information Specialty Pharmacies: [place] [phone]   [place] The plan's renewal date is 08/07/2022. This plan runs on a calendar year basis. Once the medical individual or family out of pocket maximum has been met, the patient responsibility will be 0%. For additional payer coverage criteria, please reference the BOTOX Policies and Forms link available at [email or web address].

## 2021-08-15 ENCOUNTER — Other Ambulatory Visit: Payer: Self-pay

## 2021-08-15 ENCOUNTER — Ambulatory Visit (INDEPENDENT_AMBULATORY_CARE_PROVIDER_SITE_OTHER): Payer: 59

## 2021-08-15 DIAGNOSIS — R17 Unspecified jaundice: Secondary | ICD-10-CM | POA: Diagnosis not present

## 2021-08-15 DIAGNOSIS — K8020 Calculus of gallbladder without cholecystitis without obstruction: Secondary | ICD-10-CM

## 2021-08-16 ENCOUNTER — Telehealth: Payer: Self-pay | Admitting: Family Medicine

## 2021-08-16 DIAGNOSIS — R17 Unspecified jaundice: Secondary | ICD-10-CM

## 2021-08-16 DIAGNOSIS — K8020 Calculus of gallbladder without cholecystitis without obstruction: Secondary | ICD-10-CM

## 2021-08-16 LAB — BILIRUBIN, FRACTIONATED(TOT/DIR/INDIR)
Bilirubin, Direct: 0.3 mg/dL — ABNORMAL HIGH (ref 0.0–0.2)
Indirect Bilirubin: 1 mg/dL (calc) (ref 0.2–1.2)
Total Bilirubin: 1.3 mg/dL — ABNORMAL HIGH (ref 0.2–1.2)

## 2021-08-16 NOTE — Telephone Encounter (Signed)
Please inform patient her bilirubin is still mildly elevated, both types of her bilirubin is mildly elevated. This is most likely from the gallstones.  This would be caused to have her gallbladder removed before liver damage becomes more severe.  I have placed a referral to general surgery and gastroenterology for her.   -Gastroenterology will work this up further and ensure there is no stone, or other etiology causing elevated bilirubin.  Ex: is if the common bile duct is partially blocked secondary to stone or other cause.  This would require an EGD, which is an endoscopic view through the mouth/stomach and into the duct the stones are passed through.  Surgically, removal of the gallbladder would help prevent future stones.   If she notices right upper quadrant pain, fever, nausea or vomiting she should be seen immediately in the emergency room as this can be signs of a blocked duct and would be an emergent procedure

## 2021-08-16 NOTE — Telephone Encounter (Signed)
Spoke with pt regarding labs and instructions.   

## 2021-08-23 NOTE — Telephone Encounter (Signed)
Pt advised general surgery would be the ones to remove gallbladder and GI would be doing procedure to see where stones are passing. Pt voiced understanding and will call GI office back to schedule.

## 2021-08-23 NOTE — Telephone Encounter (Signed)
Pt called in on 08/23/2021, in regards to referral for GASTROENTEROLOGY.  Pt said she contacted Gastroenterology  "Patient called to scheduled appt. Wanted a gallbladder remover. Advised her we didn't remove gallbladder. Patient states okay and hung up."  They informed pt: Patient called to scheduled appt. Wanted a gallbladder remover. Advised her we didn't remove gallbladder. Patient states okay and hung up  Pt confused on what to do.--KR  Pt [phone]

## 2021-08-24 ENCOUNTER — Encounter: Payer: Self-pay | Admitting: Internal Medicine

## 2021-08-25 ENCOUNTER — Ambulatory Visit: Payer: 59 | Admitting: Gastroenterology

## 2021-08-26 ENCOUNTER — Other Ambulatory Visit: Payer: Self-pay

## 2021-08-26 ENCOUNTER — Ambulatory Visit (INDEPENDENT_AMBULATORY_CARE_PROVIDER_SITE_OTHER): Payer: 59 | Admitting: Neurology

## 2021-08-26 DIAGNOSIS — G243 Spasmodic torticollis: Secondary | ICD-10-CM | POA: Diagnosis not present

## 2021-08-26 NOTE — Procedures (Signed)
[facility]   Procedure Note Botox  Attending: Dr. Wells Guiles Olvin Rohr  Preoperative Diagnosis(es): Cervical Dystonia  Result History  N/a  Consent obtained from: The patient Benefits discussed included, but were not limited to decreased muscle tightness, increased joint range of motion, and decreased pain.  Risk discussed included, but were not limited pain and discomfort, bleeding, bruising, excessive weakness, venous thrombosis, muscle atrophy and dysphagia.  A copy of the patient medication guide was given to the patient which explains the blackbox warning.  Patients identity and treatment sites confirmed Yes.  .  Details of Procedure: Skin was cleaned with alcohol.  A 30 gauge, 69mm  needle was introduced to the target muscle, except for posterior splenius where 27 gauge, 1.5 inch needle used.   Prior to injection, the needle plunger was aspirated to make sure the needle was not within a blood vessel.  There was no blood retrieved on aspiration.    Following is a summary of the muscles injected  And the amount of Botulinum toxin used:   Dilution 0.9% preservative free saline mixed with 100 u Botox type A to make 10 U per 0.1cc  Injections  Location Left  Right Units Number of sites        Sternocleidomastoid 50  50 1  Splenius Capitus, posterior approach  70 70 1  Splenius Capitus, lateral approach  30 30 1   Levator Scapulae      Trapezius 20/20/10  50 3        TOTAL UNITS:   200     Agent: Botulinum Type A ( Onobotulinum Toxin type A ).  2 vials of Botox were used, each containing 100 units and freshly diluted with 1 mL of sterile, non-preserved saline   Total injected (Units): 200  Total wasted (Units): none wasted   Pt tolerated procedure well without complications.   Reinjection is anticipated in 3 months.

## 2021-09-15 NOTE — Progress Notes (Signed)
° °  Assessment/Plan:   1.  Cervical dystonia  -The primary muscles involved are the L SCM, L levator scapulae (small player), L trap (shoulder elevator), R splenius capitus.   -she is doing much better following first botox but still needs an increased dose next time (still undertx)  -we will send for PT following next botox  -discussed deep tissue massage  -think that m soreness is b/c she d/c the baclofen and not b/c of the botox itself (is from the dystonia).  She can let me know if she restarts and we can call her in a RX   Subjective:   Alexandria Weber was seen today in follow up for cervical dystonia.  My previous records as well as any outside records available were reviewed prior to todays visit.  First injections of Botox were January 20.  She reports that she has had some muscle soreness in the neck - took some tylenol last night.  She thinks botox helped.  Still notes head tremor and turning, esp if in public. No dysphagia.     CURRENT MEDICATIONS:  Outpatient Encounter Medications as of 09/19/2021  Medication Sig   amLODipine (NORVASC) 2.5 MG tablet Take 1 tablet (2.5 mg total) by mouth daily.   atorvastatin (LIPITOR) 10 MG tablet Take 1 tablet (10 mg total) by mouth daily.   baclofen (LIORESAL) 10 MG tablet Take 1 tablet (10 mg total) by mouth 3 (three) times daily.   botulinum toxin Type A (BOTOX) 100 units SOLR injection Inject 300 U by physician as directed into muscle (Patient not taking: Reported on 09/19/2021)   botulinum toxin Type A (BOTOX) 100 units SOLR injection Inject into head and neck every 90 days   Cholecalciferol (DIALYVITE VITAMIN D 5000) 125 MCG (5000 UT) capsule Take 1 capsule (5,000 Units total) by mouth daily.   venlafaxine XR (EFFEXOR-XR) 75 MG 24 hr capsule Take 1 capsule (75 mg total) by mouth daily with breakfast.   No facility-administered encounter medications on file as of 09/19/2021.     Objective:   PHYSICAL EXAMINATION:    VITALS:    Vitals:   09/19/21 0851  BP: 122/76  Pulse: 83  SpO2: 97%  Weight: 156 lb (70.8 kg)  Height: 5\' 5"  (1.651 m)    GEN:  The patient appears stated age and is in NAD. HEENT:  Normocephalic, atraumatic.  The mucous membranes are moist.   Neurological examination:  Orientation: The patient is alert and oriented x3. Cranial nerves: There is good facial symmetry.The speech is fluent and clear. Soft palate rises symmetrically and there is no tongue deviation. Hearing is intact to conversational tone. Motor: Strength is at least antigravity x4.  Movement examination: Tone: Tone is good throughout. Sensation: Sensation is intact to light touch  Motor: Strength is antigravity x 4 (at least) Gait and Station: The patient is able to ambulate without difficulty.  Abnormal movements: Patient's head is turned to the right with hypertrophy and contraction of the left sternocleidomastoid but better than prior.  There is slight shoulder elevation on the left.  There is an irregular/jerky head tremor.  head pull to the R is less intense than prior    Cc:  Kuneff, Renee A, DO

## 2021-09-19 ENCOUNTER — Ambulatory Visit (INDEPENDENT_AMBULATORY_CARE_PROVIDER_SITE_OTHER): Payer: 59 | Admitting: Neurology

## 2021-09-19 ENCOUNTER — Other Ambulatory Visit: Payer: Self-pay

## 2021-09-19 ENCOUNTER — Encounter: Payer: Self-pay | Admitting: Neurology

## 2021-09-19 VITALS — BP 122/76 | HR 83 | Ht 65.0 in | Wt 156.0 lb

## 2021-09-19 DIAGNOSIS — G243 Spasmodic torticollis: Secondary | ICD-10-CM

## 2021-09-20 ENCOUNTER — Ambulatory Visit: Payer: 59 | Admitting: Internal Medicine

## 2021-10-06 ENCOUNTER — Other Ambulatory Visit (HOSPITAL_BASED_OUTPATIENT_CLINIC_OR_DEPARTMENT_OTHER): Payer: Self-pay

## 2021-10-06 ENCOUNTER — Emergency Department (HOSPITAL_BASED_OUTPATIENT_CLINIC_OR_DEPARTMENT_OTHER)
Admission: EM | Admit: 2021-10-06 | Discharge: 2021-10-06 | Disposition: A | Discharge status: Home / Self Care | Payer: 59 | Attending: Emergency Medicine | Admitting: Emergency Medicine

## 2021-10-06 ENCOUNTER — Emergency Department (HOSPITAL_BASED_OUTPATIENT_CLINIC_OR_DEPARTMENT_OTHER): Payer: 59

## 2021-10-06 ENCOUNTER — Encounter (HOSPITAL_BASED_OUTPATIENT_CLINIC_OR_DEPARTMENT_OTHER): Payer: Self-pay

## 2021-10-06 ENCOUNTER — Other Ambulatory Visit: Payer: Self-pay

## 2021-10-06 DIAGNOSIS — N9489 Other specified conditions associated with female genital organs and menstrual cycle: Secondary | ICD-10-CM | POA: Insufficient documentation

## 2021-10-06 DIAGNOSIS — S301XXA Contusion of abdominal wall, initial encounter: Secondary | ICD-10-CM

## 2021-10-06 DIAGNOSIS — R1031 Right lower quadrant pain: Secondary | ICD-10-CM | POA: Insufficient documentation

## 2021-10-06 DIAGNOSIS — Z79899 Other long term (current) drug therapy: Secondary | ICD-10-CM | POA: Insufficient documentation

## 2021-10-06 DIAGNOSIS — I10 Essential (primary) hypertension: Secondary | ICD-10-CM | POA: Insufficient documentation

## 2021-10-06 LAB — COMPREHENSIVE METABOLIC PANEL
ALT: 53 U/L — ABNORMAL HIGH (ref 0–44)
AST: 72 U/L — ABNORMAL HIGH (ref 15–41)
Albumin: 4.6 g/dL (ref 3.5–5.0)
Alkaline Phosphatase: 138 U/L — ABNORMAL HIGH (ref 38–126)
Anion gap: 9 (ref 5–15)
BUN: 15 mg/dL (ref 6–20)
CO2: 32 mmol/L (ref 22–32)
Calcium: 9 mg/dL (ref 8.9–10.3)
Chloride: 99 mmol/L (ref 98–111)
Creatinine, Ser: 0.72 mg/dL (ref 0.44–1.00)
GFR, Estimated: >60 mL/min (ref >60)
Glucose, Bld: 101 mg/dL — ABNORMAL HIGH (ref 70–99)
Potassium: 3.4 mmol/L — ABNORMAL LOW (ref 3.5–5.1)
Sodium: 140 mmol/L (ref 135–145)
Total Bilirubin: 1 mg/dL (ref 0.3–1.2)
Total Protein: 7.3 g/dL (ref 6.5–8.1)

## 2021-10-06 LAB — URINALYSIS, ROUTINE W REFLEX MICROSCOPIC
Bilirubin Urine: NEGATIVE
Glucose, UA: NEGATIVE mg/dL
Hgb urine dipstick: NEGATIVE
Ketones, ur: NEGATIVE mg/dL
Leukocytes,Ua: NEGATIVE
Nitrite: NEGATIVE
Protein, ur: NEGATIVE mg/dL
Specific Gravity, Urine: 1.011 (ref 1.005–1.030)
pH: 7.5 (ref 5.0–8.0)

## 2021-10-06 LAB — CBC
HCT: 43.7 % (ref 36.0–46.0)
Hemoglobin: 14.7 g/dL (ref 12.0–15.0)
MCH: 30.2 pg (ref 26.0–34.0)
MCHC: 33.6 g/dL (ref 30.0–36.0)
MCV: 89.9 fL (ref 80.0–100.0)
Platelets: 257 10*3/uL (ref 150–400)
RBC: 4.86 MIL/uL (ref 3.87–5.11)
RDW: 12.4 % (ref 11.5–15.5)
WBC: 4.4 10*3/uL (ref 4.0–10.5)
nRBC: 0 % (ref 0.0–0.2)

## 2021-10-06 LAB — LIPASE, BLOOD: Lipase: 28 U/L (ref 11–51)

## 2021-10-06 LAB — HCG, QUANTITATIVE, PREGNANCY: hCG, Beta Chain, Quant, S: 2 m[IU]/mL (ref <5)

## 2021-10-06 MED ORDER — MORPHINE SULFATE (PF) 4 MG/ML IV SOLN
4.0000 mg | Freq: Once | INTRAVENOUS | Status: AC
  Administered 2021-10-06: 4 mg via INTRAVENOUS
  Filled 2021-10-06: qty 1

## 2021-10-06 MED ORDER — IOHEXOL 300 MG/ML  SOLN: 75.0000 mL | Freq: Once | INTRAMUSCULAR | Status: DC | PRN

## 2021-10-06 MED ORDER — SODIUM CHLORIDE 0.9 % IV BOLUS
1000.0000 mL | Freq: Once | INTRAVENOUS | Status: AC
  Administered 2021-10-06: 1000 mL via INTRAVENOUS

## 2021-10-06 MED ORDER — IOHEXOL 300 MG/ML  SOLN
100.0000 mL | Freq: Once | INTRAMUSCULAR | Status: AC | PRN
Start: 2021-10-06 — End: 2021-10-06
  Administered 2021-10-06: 75 mL via INTRAVENOUS

## 2021-10-06 MED ORDER — OXYCODONE-ACETAMINOPHEN 5-325 MG PO TABS
1.0000 | ORAL_TABLET | Freq: Four times a day (QID) | ORAL | 0 refills | Status: DC | PRN
  Filled 2021-10-06: qty 15, 2d supply, fill #0

## 2021-10-06 NOTE — ED Provider Notes (Signed)
Patient signed out to me by Dr. Leonette Monarch pending results of abdominal CT which shows a rectus sheath hematoma.  Appendix did appear to be slightly enlarged but no signs of acute appendicitis.  These results were communicated kidney to me by Dr. Collene Mares cell from radiology who reviewed the films.  Discussed with patient and her husband and will treat with pain medication and return precautions given ?  ?Alexandria Leigh, MD ?10/06/21 [phone] ? ?

## 2021-10-06 NOTE — ED Notes (Signed)
Dc instructions reviewed with patient. Patient voiced understanding. Dc with belongings.  °

## 2021-10-06 NOTE — ED Provider Notes (Signed)
?[facility] EMERGENCY DEPT ?Provider Note ? ?CSN: [id number] ?Arrival date & time: 10/06/21 0532 ? ?Chief Complaint(s) ?Abdominal Pain ? ?HPI ?Alexandria Weber is a 56 y.o. female who presents to the emergency department with several hours of right lower quadrant abdominal pain described as dull stabbing.  Worse with movement and palpation.  Also having right flank pain.  No dysuria.  No hematuria.  No nausea or vomiting.  Patient reports having loose stools yesterday.  No fevers or chills.  ? ?The history is provided by the patient.  ? ?Past Medical History ?Past Medical History:  ?Diagnosis Date  ? Anxiety   ? Depression   ? Fibroadenoma of breast   ? H/O IN LEFT BREAST NO CHANGE PER [place]  ? Gallstones 2021  ? Heart murmur   ? mild mitral stenosis and aortic stenosis by prior PCP note. Saw Dr. Mauricio Po in 2013-2015.unable to view echo or records surrounding condition.   ? High cholesterol   ? Hyperparathyroidism ([place]) 10/30/2019  ? Hypertension   ? LBBB (left bundle branch block) 2013  ? saw Dr. Mauricio Po - stable  ? Migraine headache   ? PMS (premenstrual syndrome)   ? ?Patient Active Problem List  ? Diagnosis Date Noted  ? Cervical dystonia 07/19/2021  ? Gallstones 07/14/2021  ? Elevated bilirubin 07/14/2021  ? Abnormal head movements 07/14/2021  ? Vitamin D deficiency 10/30/2019  ? On statin therapy 08/26/2019  ? Elevated alkaline phosphatase level 08/26/2019  ? Hyperlipidemia 01/25/2016  ? Torticollis, acquired 07/27/2015  ? Mitral valve stenosis 03/20/2014  ? Hypertension 04/04/2012  ? LBBB (left bundle branch block) 03/01/2012  ? Anxiety   ? ?Home Medication(s) ?Prior to Admission medications   ?Medication Sig Start Date End Date Taking? Authorizing Provider  ?amLODipine (NORVASC) 2.5 MG tablet Take 1 tablet (2.5 mg total) by mouth daily. 07/13/21   Kuneff, Renee A, DO  ?atorvastatin (LIPITOR) 10 MG tablet Take 1 tablet (10 mg total) by mouth daily. 07/13/21   Kuneff, Renee A, DO  ?baclofen (LIORESAL)  10 MG tablet Take 1 tablet (10 mg total) by mouth 3 (three) times daily. 07/13/21   Kuneff, Renee A, DO  ?botulinum toxin Type A (BOTOX) 100 units SOLR injection Inject 300 U by physician as directed into muscle ?Patient not taking: Reported on 09/19/2021 07/19/21   Tat, Eustace Quail, DO  ?botulinum toxin Type A (BOTOX) 100 units SOLR injection Inject into head and neck every 90 days 07/25/21   Tat, Eustace Quail, DO  ?Cholecalciferol (DIALYVITE VITAMIN D 5000) 125 MCG (5000 UT) capsule Take 1 capsule (5,000 Units total) by mouth daily. 08/12/20   Renato Shin, MD  ?venlafaxine XR (EFFEXOR-XR) 75 MG 24 hr capsule Take 1 capsule (75 mg total) by mouth daily with breakfast. 07/13/21   Raoul Pitch, Renee A, DO  ?                                                                                                                                  ?  Allergies ?Penicillins, Sulfa antibiotics, and Zoloft [sertraline hcl] ? ?Review of Systems ?Review of Systems ?As noted in HPI ? ?Physical Exam ?Vital Signs  ?I have reviewed the triage vital signs ?BP (!) 169/93 (BP Location: Right Arm)   Pulse 94   Temp 98.3 ?F (36.8 ?C) (Oral)   Resp 16   Ht 5\' 5"  (1.651 m)   Wt 69.9 kg   LMP 09/07/2017   SpO2 99%   BMI 25.63 kg/m?  ? ?Physical Exam ?Vitals reviewed.  ?Constitutional:   ?   General: She is not in acute distress. ?   Appearance: She is well-developed. She is not diaphoretic.  ?HENT:  ?   Head: Normocephalic and atraumatic.  ?   Right Ear: External ear normal.  ?   Left Ear: External ear normal.  ?   Nose: Nose normal.  ?Eyes:  ?   General: No scleral icterus. ?   Conjunctiva/sclera: Conjunctivae normal.  ?Neck:  ?   Trachea: Phonation normal.  ?Cardiovascular:  ?   Rate and Rhythm: Normal rate and regular rhythm.  ?Pulmonary:  ?   Effort: Pulmonary effort is normal. No respiratory distress.  ?   Breath sounds: No stridor.  ?Abdominal:  ?   General: There is no distension.  ?   Tenderness: There is abdominal tenderness in the right lower  quadrant. There is no guarding or rebound. Positive signs include Rovsing's sign, McBurney's sign and obturator sign.  ?Musculoskeletal:     ?   General: Normal range of motion.  ?   Cervical back: Normal range of motion.  ?Neurological:  ?   Mental Status: She is alert and oriented to person, place, and time.  ?Psychiatric:     ?   Behavior: Behavior normal.  ? ? ?ED Results and Treatments ?Labs ?(all labs ordered are listed, but only abnormal results are displayed) ?Labs Reviewed  ?COMPREHENSIVE METABOLIC PANEL - Abnormal; Notable for the following components:  ?    Result Value  ? Potassium 3.4 (*)   ? Glucose, Bld 101 (*)   ? AST 72 (*)   ? ALT 53 (*)   ? Alkaline Phosphatase 138 (*)   ? All other components within normal limits  ?LIPASE, BLOOD  ?CBC  ?HCG, QUANTITATIVE, PREGNANCY  ?URINALYSIS, ROUTINE W REFLEX MICROSCOPIC  ?                                                                                                                       ?EKG ? EKG Interpretation ? ?Date/Time:    ?Ventricular Rate:    ?PR Interval:    ?QRS Duration:   ?QT Interval:    ?QTC Calculation:   ?R Axis:     ?Text Interpretation:   ?  ? ?  ? ?Radiology ?No results found. ? ?Pertinent labs & imaging results that were available during my care of the patient were reviewed by me and considered in my medical decision making (see MDM for details). ? ?  Medications Ordered in ED ?Medications  ?sodium chloride 0.9 % bolus 1,000 mL (1,000 mLs Intravenous New Bag/Given 10/06/21 0615)  ?morphine (PF) 4 MG/ML injection 4 mg (4 mg Intravenous Given 10/06/21 0617)  ?iohexol (OMNIPAQUE) 300 MG/ML solution 100 mL (75 mLs Intravenous Contrast Given 10/06/21 0701)  ?                                                               ?                                                                    ?Procedures ?Procedures ? ?(including critical care time) ? ?Medical Decision Making / ED Course ? ? ? Complexity of Problem: ? ?Co morbidities that complicate the  patient evaluation ?N/a ? ?Additional history obtained: ?On review of records, an ultrasound performed June 2021 revealed evidence of cholelithiasis.  Also noted left renal stone.  ? ?Patient's presenting problem/concern and DDX listed below: ?Right lower quadrant pain ?Appendicitis, renal colic, UTI, enteritis/colitis, biliary disease. ?Less concern for bowel obstruction, ovarian torsion.  ? ? ?  Complexity of Data: ?  ?Cardiac Monitoring: ?Not applicable ? ? ?Laboratory Tests ordered listed below with my independent interpretation: ?CBC without leukocytosis or anemia ?Metabolic panel without significant electrolyte derangements or renal sufficiency.  Mildly elevated LFTs. ?Not consistent with pancreatitis ?UA pending ?  ?  ?Imaging Studies ordered listed below with my independent interpretation: ?CT pending ?  ?  ?ED Course:   ? ?Based on above concerns, hospitalization possible: Yes ? ?Assessment, Intervention, and Reassessment: ?Right lower quadrant pain ?IV fluids ?IV pain medicine ?Patient care turned over to oncoming provider. Patient case and results discussed in detail; please see their note for further ED managment.  ? ?Final Clinical Impression(s) / ED Diagnoses ?Final diagnoses:  ?None  ? ? ?  ? ? ? ? ? ?This chart was dictated using voice recognition software.  Despite best efforts to proofread,  errors can occur which can change the documentation meaning. ? ?  ?Fatima Blank, MD ?10/06/21 [phone] ? ?

## 2021-10-06 NOTE — Discharge Instructions (Addendum)
Go to [facility] or [facility] if you develop fever or worsening pain ?

## 2021-10-06 NOTE — ED Triage Notes (Signed)
Abdominal pain starting around 4 am this morning.  Denies N/V/D or urinary symptoms. ?

## 2021-10-10 ENCOUNTER — Ambulatory Visit (INDEPENDENT_AMBULATORY_CARE_PROVIDER_SITE_OTHER): Payer: 59 | Admitting: Family Medicine

## 2021-10-10 ENCOUNTER — Other Ambulatory Visit (HOSPITAL_BASED_OUTPATIENT_CLINIC_OR_DEPARTMENT_OTHER): Payer: Self-pay

## 2021-10-10 ENCOUNTER — Ambulatory Visit: Payer: 59 | Admitting: Family

## 2021-10-10 ENCOUNTER — Encounter: Payer: Self-pay | Admitting: Family Medicine

## 2021-10-10 ENCOUNTER — Other Ambulatory Visit: Payer: Self-pay

## 2021-10-10 VITALS — BP 119/79 | HR 78 | Temp 98.0°F | Ht 65.0 in | Wt 156.0 lb

## 2021-10-10 DIAGNOSIS — R21 Rash and other nonspecific skin eruption: Secondary | ICD-10-CM

## 2021-10-10 DIAGNOSIS — M6281 Muscle weakness (generalized): Secondary | ICD-10-CM

## 2021-10-10 DIAGNOSIS — R7989 Other specified abnormal findings of blood chemistry: Secondary | ICD-10-CM

## 2021-10-10 HISTORY — DX: Other specified abnormal findings of blood chemistry: R79.89

## 2021-10-10 HISTORY — DX: Muscle weakness (generalized): M62.81

## 2021-10-10 LAB — COMPREHENSIVE METABOLIC PANEL
ALT: 62 U/L — ABNORMAL HIGH (ref 0–35)
AST: 87 U/L — ABNORMAL HIGH (ref 0–37)
Albumin: 4.5 g/dL (ref 3.5–5.2)
Alkaline Phosphatase: 140 U/L — ABNORMAL HIGH (ref 39–117)
BUN: 14 mg/dL (ref 6–23)
CO2: 31 mEq/L (ref 19–32)
Calcium: 9 mg/dL (ref 8.4–10.5)
Chloride: 99 mEq/L (ref 96–112)
Creatinine, Ser: 0.63 mg/dL (ref 0.40–1.20)
GFR: 101.1 mL/min (ref >60.00)
Glucose, Bld: 102 mg/dL — ABNORMAL HIGH (ref 70–99)
Potassium: 3.6 mEq/L (ref 3.5–5.1)
Sodium: 140 mEq/L (ref 135–145)
Total Bilirubin: 1 mg/dL (ref 0.2–1.2)
Total Protein: 6.8 g/dL (ref 6.0–8.3)

## 2021-10-10 LAB — TSH: TSH: 2.41 u[IU]/mL (ref 0.35–5.50)

## 2021-10-10 LAB — CK: Total CK: 693 U/L — ABNORMAL HIGH (ref 7–177)

## 2021-10-10 LAB — SEDIMENTATION RATE: Sed Rate: 24 mm/hr (ref 0–30)

## 2021-10-10 MED ORDER — PREDNISONE 20 MG PO TABS: ORAL_TABLET | ORAL | 0 refills | Status: DC

## 2021-10-10 NOTE — Patient Instructions (Signed)
Dermatitis herpetiformis ? ? ?

## 2021-10-10 NOTE — Progress Notes (Signed)
This visit occurred during the SARS-CoV-2 public health emergency.  Safety protocols were in place, including screening questions prior to the visit, additional usage of staff PPE, and extensive cleaning of exam room while observing appropriate contact time as indicated for disinfecting solutions.    Alexandria Weber , November 26, 1965, 56 y.o., female MRN: [id number] Patient Care Team    Relationship Specialty Notifications Start End  Natalia Leatherwood, DO PCP - General Family Medicine  07/27/15   [facility]    07/13/21   Tat, Octaviano Batty, DO Consulting Physician Neurology  07/19/21     Chief Complaint  Patient presents with   Rash    Pt c/o rash on shoulders and hips, described as itchy, burning with redness and muscle feeling "heavy" x 2 weeks;      Subjective: Pt presents for an OV with complaints of little over 2 weeks of feeling increased weakness in her upper arms and upper thighs.  She has not noticed any weakness that makes her difficult to perform her normal activity daily, but states she has to be careful if she is getting something off of a high shelf, she does need assistance.  She describes the weakness as a "heaviness "sensation in these muscle groups.  She denies any changes in medication, vaccinations or illnesses prior to the onset of her symptoms.  She is receiving Botox injections in her neck for torticollis, last injection January 23.  However the symptoms occurred approximately 4 weeks later and are also located in areas that have not been injected.  He does endorse mild nausea, but no vomiting.  No diarrhea or other GI symptoms.  She has never been told she has a gluten allergy and she does not watch her gluten intake.  She denies any known tick borne illness exposure.  She does have dogs that go outside to use the bathroom.  She was seen in the emergency room last week and labs were collected at that time which resulted in elevated liver enzymes.   Depression  screen [facility] 2/9 10/10/2021 02/10/2021 06/28/2020 06/24/2019 12/11/2018  Decreased Interest 0 0 0 0 0  Down, Depressed, Hopeless 0 0 0 0 0  PHQ - 2 Score 0 0 0 0 0  Altered sleeping - 0 - 0 1  Tired, decreased energy - 0 - 0 0  Change in appetite - 0 - 0 0  Feeling bad or failure about yourself  - 0 - 0 0  Trouble concentrating - 0 - 0 0  Moving slowly or fidgety/restless - 0 - 0 0  Suicidal thoughts - 0 - 0 0  PHQ-9 Score - 0 - 0 1  Difficult doing work/chores - - - Not difficult at all Not difficult at all    Allergies  Allergen Reactions   Penicillins Hives   Sulfa Antibiotics Hives   Zoloft [Sertraline Hcl]    Social History   Social History Narrative   Married. Spouse is name Romeo Apple. 3 children.   High school graduate. Full-time employed. Location manager at a bank.   Drinks caffeinated beverages, no tobacco, no alcohol, no recreational drugs.   Wears her seatbelt. Smoke detectors located in the home. Firearms located in the home in a locked case.   Feels safe in her relationship.      Right Handed    Lives in a one story home   Past Medical History:  Diagnosis Date   Anxiety    Depression  Fibroadenoma of breast    H/O IN LEFT BREAST NO CHANGE PER [place]   Gallstones 2021   Heart murmur    mild mitral stenosis and aortic stenosis by prior PCP note. Saw Dr. Leeann Must in 2013-2015.unable to view echo or records surrounding condition.    High cholesterol    Hyperparathyroidism (HCC) 10/30/2019   Hypertension    LBBB (left bundle branch block) 2013   saw Dr. Leeann Must - stable   Migraine headache    PMS (premenstrual syndrome)    Past Surgical History:  Procedure Laterality Date   CESAREAN SECTION     x2   TONSILLECTOMY     Family History  Problem Relation Age of Onset   Heart disease Mother    Hearing loss Mother    Heart disease Father    Hypertension Father    Stroke Father    Arthritis Father    Drug abuse Brother        Commited suicide   Hypertension  Maternal Grandfather    Hypertension Paternal Grandfather    Heart disease Paternal Grandfather    Diabetes Mellitus II Maternal Aunt    Diabetes Mellitus II Paternal Aunt    Heart disease Paternal Uncle    Colon cancer Neg Hx    Breast cancer Neg Hx    Hyperparathyroidism Neg Hx    Allergies as of 10/10/2021       Reactions   Penicillins Hives   Sulfa Antibiotics Hives   Zoloft [sertraline Hcl]         Medication List        Accurate as of October 10, 2021  1:10 PM. If you have any questions, ask your nurse or doctor.          STOP taking these medications    oxyCODONE-acetaminophen 5-325 MG tablet Commonly known as: PERCOCET/ROXICET Stopped by: Felix Pacini, DO       TAKE these medications    amLODipine 2.5 MG tablet Commonly known as: NORVASC Take 1 tablet (2.5 mg total) by mouth daily.   atorvastatin 10 MG tablet Commonly known as: LIPITOR Take 1 tablet (10 mg total) by mouth daily.   baclofen 10 MG tablet Commonly known as: LIORESAL Take 1 tablet (10 mg total) by mouth 3 (three) times daily.   Botox 100 units Solr injection Generic drug: botulinum toxin Type A Inject 300 U by physician as directed into muscle   Botox 100 units Solr injection Generic drug: botulinum toxin Type A Inject into head and neck every 90 days   Dialyvite Vitamin D 5000 125 MCG (5000 UT) capsule Generic drug: Cholecalciferol Take 1 capsule (5,000 Units total) by mouth daily.   predniSONE 20 MG tablet Commonly known as: DELTASONE 40 mg x3d, 20 mg x5d, 10 mg x4d Started by: Felix Pacini, DO   venlafaxine XR 75 MG 24 hr capsule Commonly known as: EFFEXOR-XR Take 1 capsule (75 mg total) by mouth daily with breakfast.        All past medical history, surgical history, allergies, family history, immunizations andmedications were updated in the EMR today and reviewed under the history and medication portions of their EMR.     ROS Negative, with the exception of above  mentioned in HPI   Objective:  BP 119/79   Pulse 78   Temp 98 F (36.7 C) (Oral)   Ht 5\' 5"  (1.651 m)   Wt 156 lb (70.8 kg)   LMP 09/07/2017   SpO2 98%   BMI  25.96 kg/m  Body mass index is 25.96 kg/m. Physical Exam Vitals and nursing note reviewed.  Constitutional:      General: She is not in acute distress.    Appearance: Normal appearance. She is not ill-appearing, toxic-appearing or diaphoretic.  HENT:     Head: Normocephalic and atraumatic.  Eyes:     General: No scleral icterus.       Right eye: No discharge.        Left eye: No discharge.     Extraocular Movements: Extraocular movements intact.     Conjunctiva/sclera: Conjunctivae normal.     Pupils: Pupils are equal, round, and reactive to light.  Cardiovascular:     Rate and Rhythm: Normal rate and regular rhythm.  Pulmonary:     Effort: Pulmonary effort is normal. No respiratory distress.     Breath sounds: Normal breath sounds. No wheezing, rhonchi or rales.  Musculoskeletal:     Cervical back: Neck supple. No tenderness.  Lymphadenopathy:     Cervical: No cervical adenopathy.  Skin:    General: Skin is warm and dry.     Findings: Rash present. Rash is macular and papular.       Neurological:     Mental Status: She is alert and oriented to person, place, and time. Mental status is at baseline.     Motor: No weakness.     Gait: Gait normal.  Psychiatric:        Mood and Affect: Mood normal.        Behavior: Behavior normal.        Thought Content: Thought content normal.        Judgment: Judgment normal.      No results found. No results found. No results found for this or any previous visit (from the past 24 hour(s)).  Assessment/Plan: Alexandria Weber is a 56 y.o. female present for OV for  Muscle weakness/rash Discussed differential diagnosis of dermatitis herpetiformis versus polymyositis/dermatomyositis suspected with her symmetrical rash and symmetrical muscle weakness. We will start  laboratory work-up today and likely referral to rheumatology as soon as possible. We will start prednisone taper to see if she receives any benefit while we await on results. We will also rule out tickborne disease, although low yield.      B. burgdorfi antibodies   Rocky mtn spotted fvr abs pnl(IgG+IgM)   Comp Met (CMET)   TSH   Celiac Panel   Sedimentation rate   ANA, IFA Comprehensive Panel-([facility])   CK  She will be called with results and likely referral to rheumatology will be placed for her. Reviewed expectations re: course of current medical issues. Discussed self-management of symptoms. Outlined signs and symptoms indicating need for more acute intervention. Patient verbalized understanding and all questions were answered. Patient received an After-Visit Summary.    Orders Placed This Encounter  Procedures   B. burgdorfi antibodies   Rocky mtn spotted fvr abs pnl(IgG+IgM)   Comp Met (CMET)   TSH   Celiac Panel   Sedimentation rate   ANA, IFA Comprehensive Panel-([facility])   CK   Meds ordered this encounter  Medications   predniSONE (DELTASONE) 20 MG tablet    Sig: 40 mg x3d, 20 mg x5d, 10 mg x4d    Dispense:  20 tablet    Refill:  0   Referral Orders  No referral(s) requested today     Note is dictated utilizing voice recognition software. Although note has been proof read prior to signing, occasional  typographical errors still can be missed. If any questions arise, please do not hesitate to call for verification.   electronically signed by:  Felix Pacini, DO  [facility] - [place]

## 2021-10-11 ENCOUNTER — Telehealth: Payer: Self-pay | Admitting: Family Medicine

## 2021-10-11 DIAGNOSIS — R21 Rash and other nonspecific skin eruption: Secondary | ICD-10-CM

## 2021-10-11 DIAGNOSIS — M6281 Muscle weakness (generalized): Secondary | ICD-10-CM

## 2021-10-11 LAB — SPECIMEN STATUS

## 2021-10-11 NOTE — Telephone Encounter (Signed)
Discussed results with patient today.  ?strongly suspect dermatomyositis with symmetrical rash and weakness of proximal muscle group.  ?- Elevated LFT> mildly increased from 4 days ago.  ?- elevated CK> 600 ?- Normal ESR ?Instructions to patient.  ?- hydrate ?- stop stain for now- not the cause of elevated lft, however do not want to continue/restart until enzymes return to normal to decrease liver burden ?She has started the prednisone taper yeasterday ?Other labs still pending,  ?Referral to Rheumatology urgently placed.  ?

## 2021-10-11 NOTE — Telephone Encounter (Signed)
Noted  

## 2021-10-12 LAB — GLIA (IGA/G) + TTG IGA
Antigliadin Abs, IgA: 4 units (ref 0–19)
Gliadin IgG: 3 units (ref 0–19)
Transglutaminase IgA: <2 U/mL (ref 0–3)

## 2021-10-13 ENCOUNTER — Telehealth: Payer: Self-pay | Admitting: Family Medicine

## 2021-10-13 LAB — ANA, IFA COMPREHENSIVE PANEL
Anti Nuclear Antibody (ANA): POSITIVE — AB
ENA SM Ab Ser-aCnc: <1 AI
SM/RNP: <1 AI
SSA (Ro) (ENA) Antibody, IgG: <1 AI
SSB (La) (ENA) Antibody, IgG: <1 AI
Scleroderma (Scl-70) (ENA) Antibody, IgG: <1 AI
ds DNA Ab: <1 IU/mL

## 2021-10-13 LAB — ANTI-NUCLEAR AB-TITER (ANA TITER)
ANA TITER: 1:320 {titer} — ABNORMAL HIGH
ANA Titer 1: 1:320 {titer} — ABNORMAL HIGH

## 2021-10-13 LAB — ROCKY MTN SPOTTED FVR ABS PNL(IGG+IGM)
RMSF IgG: NOT DETECTED
RMSF IgM: NOT DETECTED

## 2021-10-13 LAB — B. BURGDORFI ANTIBODIES: B burgdorferi Ab IgG+IgM: <0.9 index

## 2021-10-13 NOTE — Telephone Encounter (Signed)
Spoke with pt regarding labs and instructions.   

## 2021-10-13 NOTE — Telephone Encounter (Signed)
Please inform patient her autoimmune panel is significantly positive. ?The ANA titers are positive for the pattern that is seen with dermatomyositis/polymyositis ?Conditions. ?I hope she is seeing at least some improvement with the steroid, and is able to get into the rheumatology quickly. ? ?

## 2021-10-23 ENCOUNTER — Other Ambulatory Visit: Payer: Self-pay | Admitting: Neurology

## 2021-10-26 ENCOUNTER — Other Ambulatory Visit: Payer: Self-pay

## 2021-10-26 DIAGNOSIS — G243 Spasmodic torticollis: Secondary | ICD-10-CM

## 2021-10-26 MED ORDER — BOTOX 100 UNITS IJ SOLR: INTRAMUSCULAR | 0 refills | Status: DC

## 2021-11-08 ENCOUNTER — Telehealth: Payer: Self-pay | Admitting: Neurology

## 2021-11-08 NOTE — Telephone Encounter (Signed)
Called pateint unable to reach left urgent message  ?

## 2021-11-08 NOTE — Telephone Encounter (Signed)
Reviewed PCP notes and spoke with PCP.  While PCP doesn't think that botox had anything to do with the weakness, she would like [place] to hold on next botox until after rheum sees patient and gives better guidance.  Chelsea, let pt know.  Tell her we are going to remove her botox appt for now.  Her rheum appt is actually before her botox appt so if things change we can potentially put her back on the schedule (maybe not same time slot, but should be able to get her back on the schedule if she has insurance auth and if her botox is here on time).   ?

## 2021-11-08 NOTE — Telephone Encounter (Signed)
Called pateint and she understands and will call to let us know as soon as she hears from the Rhem dr  ?

## 2021-11-08 NOTE — Telephone Encounter (Signed)
Patient called back

## 2021-11-12 NOTE — Progress Notes (Signed)
? ?Office Visit Note ? ?Patient: Alexandria Weber             ?Date of Birth: 1966/07/26           ?MRN: [id number]             ?PCP: Howard Pouch A, DO ?Referring: Howard Pouch A, DO ?Visit Date: 11/15/2021 ? ? ?Subjective:  ?New Patient (Initial Visit) (Rash bil hips and bil shoulders, weakness, aching muscles, ER at [facility] 10/05/2021) ? ? ?History of Present Illness: Alexandria Weber is a 56 y.o. female here for evaluation of skin rashes and muscle weakness concerned for dermatomyositis. Initial evaluation at the ED beginning of last month after spontaneous rectus sheath hematoma jerking awake in bed with right sided abdominal pain and bruising. She subsequently reported non-debilitating weakness involving proximal arms and legs and labs showed moderate CK elevation and LFTs elevation. She started a prednisone taper in following 2 weeks. She stopped taking atorvastatin medication after this finding. Symptoms improved partially with these treatments but muscle strength still does not feel normal and rash on the hip remains. ? ?She has a history of some cervical dysplasia but not malignancies. She denies any oral ulcers, lymphadenopathy, raynaud's symptoms, or history of blood clots. ?She start botox injection for cervical dystonia in February at [facility] which was helpful and scheduled to repeat this but awaiting clearance about it affecting these new symptoms.  ?She is also scheduled to see [facility] next week for a skin check she never saw them before with no prior history of skin cancer or problems. ? ?Labs reviewed ?ANA 1:320 speckled 1:320 homogenous ?dsDNA, Scl-70, SM, RNP, SSA, SSB neg ?CK 693 ?ESR 24 ?TSH 2.41 ?Alkphos 140 AST 87 ALT 62 ?RMSF neg Lyme neg ? ?Activities of Daily Living:  ?Patient reports morning stiffness for 10 minutes.   ?Patient Denies nocturnal pain.  ?Difficulty dressing/grooming: Reports ?Difficulty climbing stairs: Denies ?Difficulty getting out of chair:  Denies ?Difficulty using hands for taps, buttons, cutlery, and/or writing: Denies ? ?Review of Systems  ?Constitutional:  Negative for fatigue.  ?HENT:  Positive for mouth dryness.   ?Eyes:  Negative for dryness.  ?Respiratory:  Negative for shortness of breath.   ?Cardiovascular:  Negative for swelling in legs/feet.  ?Gastrointestinal:  Negative for diarrhea.  ?Endocrine: Negative for excessive thirst.  ?Genitourinary:  Negative for difficulty urinating.  ?Musculoskeletal:  Positive for muscle weakness, morning stiffness and muscle tenderness.  ?Skin:  Positive for rash.  ?Allergic/Immunologic: Negative for susceptible to infections.  ?Neurological:  Positive for weakness.  ?Hematological:  Negative for bruising/bleeding tendency.  ?Psychiatric/Behavioral:  Negative for sleep disturbance.   ? ?PMFS History:  ?Patient Active Problem List  ? Diagnosis Date Noted  ? Positive ANA (antinuclear antibody) 11/15/2021  ? Elevated CK 11/15/2021  ? Elevated LFTs 10/10/2021  ? Muscle weakness 10/10/2021  ? Rash 10/10/2021  ? Cervical dystonia 07/19/2021  ? Gallstones 07/14/2021  ? Elevated bilirubin 07/14/2021  ? Abnormal head movements 07/14/2021  ? Vitamin D deficiency 10/30/2019  ? On statin therapy 08/26/2019  ? Elevated alkaline phosphatase level 08/26/2019  ? Hyperlipidemia 01/25/2016  ? Torticollis, acquired 07/27/2015  ? Mitral valve stenosis 03/20/2014  ? Hypertension 04/04/2012  ? LBBB (left bundle branch block) 03/01/2012  ? Anxiety   ?  ?Past Medical History:  ?Diagnosis Date  ? Anxiety   ? Depression   ? Fibroadenoma of breast   ? H/O IN LEFT BREAST NO CHANGE PER [place]  ? Gallstones 2021  ?  Heart murmur   ? mild mitral stenosis and aortic stenosis by prior PCP note. Saw Dr. Mauricio Po in 2013-2015.unable to view echo or records surrounding condition.   ? High cholesterol   ? Hyperparathyroidism ([place]) 10/30/2019  ? Hypertension   ? LBBB (left bundle branch block) 2013  ? saw Dr. Mauricio Po - stable  ? Migraine headache   ?  PMS (premenstrual syndrome)   ?  ?Family History  ?Problem Relation Age of Onset  ? Heart disease Mother   ? Hearing loss Mother   ? Heart disease Father   ? Hypertension Father   ? Stroke Father   ? Arthritis Father   ? Drug abuse Brother   ?     Commited suicide  ? Hypertension Maternal Grandfather   ? Hypertension Paternal Grandfather   ? Heart disease Paternal Grandfather   ? Diabetes Mellitus II Maternal Aunt   ? Diabetes Mellitus II Paternal Aunt   ? Heart disease Paternal Uncle   ? Colon cancer Neg Hx   ? Breast cancer Neg Hx   ? Hyperparathyroidism Neg Hx   ? ?Past Surgical History:  ?Procedure Laterality Date  ? ADENOIDECTOMY    ? CESAREAN SECTION    ? x2  ? ?Social History  ? ?Social History Narrative  ? Married. Spouse is name Alexandria Weber. 3 children.  ? High school graduate. Full-time employed. 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  ? Drinks caffeinated beverages, no tobacco, no alcohol, no recreational drugs.  ? Wears her seatbelt. Smoke detectors located in the home. Firearms located in the home in a locked case.  ? Feels safe in her relationship.  ?   ? Right Handed   ? Lives in a one story home  ? ?Immunization History  ?Administered Date(s) Administered  ? Influenza Inj Mdck Quad Pf 08/26/2018  ? Influenza, Quadrivalent, Recombinant, Inj, Pf 05/18/2019  ? Influenza,inj,Quad PF,6+ Mos 07/27/2015, 09/05/2016, 06/28/2020, 06/02/2021  ? Influenza-Unspecified 06/17/2013, 06/17/2014, 06/05/2017  ? PFIZER(Purple Top)SARS-COV-2 Vaccination 10/27/2019, 11/24/2019, 08/20/2020  ? Pension scheme manager 8yr & up 05/31/2021  ? Tdap 01/25/2016  ? Zoster Recombinat (Shingrix) 06/12/2018, 10/10/2018  ?  ? ?Objective: ?Vital Signs: BP (!) 144/82 (BP Location: Right Arm, Patient Position: Sitting, Cuff Size: Normal)   Pulse 85   Resp 15   Ht 5' 5" (1.651 m)   Wt 154 lb (69.9 kg)   LMP 09/07/2017   BMI 25.63 kg/m?   ? ?Physical Exam ?HENT:  ?   Right Ear: External ear normal.  ?   Left Ear: External ear  normal.  ?   Mouth/Throat:  ?   Mouth: Mucous membranes are moist.  ?   Pharynx: Oropharynx is clear.  ?Eyes:  ?   Conjunctiva/sclera: Conjunctivae normal.  ?Cardiovascular:  ?   Rate and Rhythm: Normal rate and regular rhythm.  ?Pulmonary:  ?   Effort: Pulmonary effort is normal.  ?   Breath sounds: Normal breath sounds.  ?Musculoskeletal:  ?   Right lower leg: No edema.  ?   Left lower leg: No edema.  ?Skin: ?   General: Skin is warm and dry.  ?   Comments: Faint erythematous patches on upper back on left side ?Right lateral hip with erythematous rash ?Erythema at base of fingernails on both hands, no digital pitting or nail changes  ?Neurological:  ?   Mental Status: She is alert.  ?   Deep Tendon Reflexes: Reflexes normal.  ?   Comments: Strength is  grossly intact throughout with normal reflexes and full ROM  ?Psychiatric:     ?   Mood and Affect: Mood normal.  ?  ? ?Musculoskeletal Exam:  ?Shoulders full ROM no tenderness or swelling ?Elbows full ROM no tenderness or swelling ?Wrists full ROM no tenderness or swelling ?Fingers full ROM no tenderness or swelling ?Knees full ROM no tenderness or swelling ?Ankles full ROM no tenderness or swelling ?MTPs full ROM no tenderness or swelling ? ?Investigation: ?No additional findings. ? ?Imaging: ?No results found. ? ?Recent Labs: ?Lab Results  ?Component Value Date  ? WBC WILL FOLLOW 10/10/2021  ? HGB WILL FOLLOW 10/10/2021  ? PLT WILL FOLLOW 10/10/2021  ? NA 140 10/10/2021  ? K 3.6 10/10/2021  ? CL 99 10/10/2021  ? CO2 31 10/10/2021  ? GLUCOSE 102 (H) 10/10/2021  ? BUN 14 10/10/2021  ? CREATININE 0.63 10/10/2021  ? BILITOT 1.0 10/10/2021  ? ALKPHOS 140 (H) 10/10/2021  ? AST 87 (H) 10/10/2021  ? ALT 62 (H) 10/10/2021  ? PROT 6.8 10/10/2021  ? ALBUMIN 4.5 10/10/2021  ? CALCIUM 9.0 10/10/2021  ? GFRAA >89 09/05/2016  ? ? ?Speciality Comments: No specialty comments available. ? ?Procedures:  ?No procedures performed ?Allergies: Penicillins, Sulfa antibiotics, and Zoloft  [sertraline hcl]  ? ?Assessment / Plan:     ?Visit Diagnoses: Muscle weakness - Plan: Myositis Assessr Plus Jo-1 Autoabs, CK, 3-Hydroxy-3-Methylglutaryl-Coenzyme A Reductase (HMGCR) AB (IgG) ? ?Muscle weakness

## 2021-11-15 ENCOUNTER — Ambulatory Visit (INDEPENDENT_AMBULATORY_CARE_PROVIDER_SITE_OTHER): Payer: 59 | Admitting: Internal Medicine

## 2021-11-15 ENCOUNTER — Encounter: Payer: Self-pay | Admitting: Internal Medicine

## 2021-11-15 VITALS — BP 144/82 | HR 85 | Resp 15 | Ht 65.0 in | Wt 154.0 lb

## 2021-11-15 DIAGNOSIS — R748 Abnormal levels of other serum enzymes: Secondary | ICD-10-CM | POA: Diagnosis not present

## 2021-11-15 DIAGNOSIS — R768 Other specified abnormal immunological findings in serum: Secondary | ICD-10-CM

## 2021-11-15 DIAGNOSIS — R21 Rash and other nonspecific skin eruption: Secondary | ICD-10-CM

## 2021-11-15 DIAGNOSIS — M6281 Muscle weakness (generalized): Secondary | ICD-10-CM | POA: Diagnosis not present

## 2021-11-15 HISTORY — DX: Other specified abnormal immunological findings in serum: R76.8

## 2021-11-15 HISTORY — DX: Abnormal levels of other serum enzymes: R74.8

## 2021-11-25 ENCOUNTER — Ambulatory Visit: Payer: 59 | Admitting: Neurology

## 2021-11-25 ENCOUNTER — Ambulatory Visit (INDEPENDENT_AMBULATORY_CARE_PROVIDER_SITE_OTHER): Payer: 59 | Admitting: Neurology

## 2021-11-25 DIAGNOSIS — G243 Spasmodic torticollis: Secondary | ICD-10-CM | POA: Diagnosis not present

## 2021-11-25 LAB — MYOSITIS ASSESSR PLUS JO-1 AUTOABS
EJ Autoabs: NOT DETECTED
Jo-1 Autoabs: <1 AI
Ku Autoabs: NOT DETECTED
Mi-2 Autoabs: NOT DETECTED
OJ Autoabs: NOT DETECTED
PL-12 Autoabs: NOT DETECTED
PL-7 Autoabs: NOT DETECTED
SRP Autoabs: NOT DETECTED

## 2021-11-25 LAB — CK: Total CK: 78 U/L (ref 29–143)

## 2021-11-25 LAB — HMGCR AB (IGG): HMGCR AB (IGG): <2 CU (ref <20)

## 2021-11-25 MED ORDER — ONABOTULINUMTOXINA 100 UNITS IJ SOLR
240.0000 [IU] | Freq: Once | INTRAMUSCULAR | Status: AC
  Administered 2021-11-25: 240 [IU] via INTRAMUSCULAR

## 2021-11-25 NOTE — Procedures (Signed)
[facility]  ? ?Procedure Note Botox ? ?Attending: Dr. Wells Guiles Jennalee Greaves ? ?Preoperative Diagnosis(es): Cervical Dystonia ? ?Result History  ?Did well but still with sx's.  Increased dosage this visit.  Pt requesting PT to help post botox ? ?Consent obtained from: The patient ?Benefits discussed included, but were not limited to decreased muscle tightness, increased joint range of motion, and decreased pain.  Risk discussed included, but were not limited pain and discomfort, bleeding, bruising, excessive weakness, venous thrombosis, muscle atrophy and dysphagia.  A copy of the patient medication guide was given to the patient which explains the blackbox warning. ? ?Patients identity and treatment sites confirmed Yes.  . ? ?Details of Procedure: ?Skin was cleaned with alcohol.  A 30 gauge, 86m  needle was introduced to the target muscle, except for posterior splenius where 27 gauge, 1.5 inch needle used.   Prior to injection, the needle plunger was aspirated to make sure the needle was not within a blood vessel.  There was no blood retrieved on aspiration.   ? ?Following is a summary of the muscles injected  And the amount of Botulinum toxin used: ? ? ?Dilution ?0.9% preservative free saline mixed with 100 u Botox type A to make 10 U per 0.1cc ? ?Injections  ?Location Left  Right Units Number of sites  ?      ?Sternocleidomastoid 60  60 1  ?Splenius Capitus, posterior approach  100 100 1  ?Splenius Capitus, lateral approach  50 50 1  ?Levator Scapulae      ?Trapezius 20/20/10  50 3  ?      ?TOTAL UNITS:   260   ?  ?Agent: Botulinum Type A ( Onobotulinum Toxin type A ).  3vials of Botox were used, each containing 100 units and freshly diluted with 1 mL of sterile, non-preserved saline ? ? Total injected (Units): 260 ? Total wasted (Units):40 ? ? ?Pt tolerated procedure well without complications.   ?Reinjection is anticipated in 3 months.  ?

## 2021-11-25 NOTE — Addendum Note (Signed)
Addended by: Jonnie Finner C on: 11/25/2021 03:16 PM ? ? Modules accepted: Orders ? ?

## 2021-11-30 NOTE — Progress Notes (Signed)
? ?Office Visit Note ? ?Patient: Alexandria Weber             ?Date of Birth: March 07, 1966           ?MRN: [id number]             ?PCP: Howard Pouch A, DO ?Referring: Howard Pouch A, DO ?Visit Date: 12/01/2021 ? ? ?Subjective:  ?Follow-up (Doing good) ? ? ?History of Present Illness: Alexandria Weber is a 56 y.o. female here for follow up for muscle weakness and rashes. Labs at initial visit with improved CK and myositis specific antibodies were negative. Since our visit she had biopsy of the skin rash on her hip.  Pathology findings consistent with possible drug reaction but with interface dermatitis features cannot exclude autoimmune process such as lupus or dermatomyositis.  She continues to have soreness raising her arms overhead but otherwise feels strength is okay. She had next botox injection for torticollis. Skin rash remains the same as before. She was prescribed topical triamcinolone for her finger redness. ? ?Previous HPI ?11/15/21 ?Alexandria Weber is a 56 y.o. female here for evaluation of skin rashes and muscle weakness concerned for dermatomyositis. Initial evaluation at the ED beginning of last month after spontaneous rectus sheath hematoma jerking awake in bed with right sided abdominal pain and bruising. She subsequently reported non-debilitating weakness involving proximal arms and legs and labs showed moderate CK elevation and LFTs elevation. She started a prednisone taper in following 2 weeks. She stopped taking atorvastatin medication after this finding. Symptoms improved partially with these treatments but muscle strength still does not feel normal and rash on the hip remains. ?  ?She has a history of some cervical dysplasia but not malignancies. She denies any oral ulcers, lymphadenopathy, raynaud's symptoms, or history of blood clots. ?She start botox injection for cervical dystonia in February at [facility] which was helpful and scheduled to repeat this but awaiting clearance about  it affecting these new symptoms.  ?She is also scheduled to see [facility] next week for a skin check she never saw them before with no prior history of skin cancer or problems. ?  ?Labs reviewed ?ANA 1:320 speckled 1:320 homogenous ?dsDNA, Scl-70, SM, RNP, SSA, SSB neg ?CK 693 ?ESR 24 ?TSH 2.41 ?Alkphos 140 AST 87 ALT 62 ?RMSF neg Lyme neg ? ? ?Review of Systems  ?Constitutional:  Negative for fatigue.  ?HENT:  Positive for mouth dryness.   ?Eyes:  Negative for dryness.  ?Respiratory:  Negative for shortness of breath.   ?Cardiovascular:  Negative for swelling in legs/feet.  ?Gastrointestinal:  Negative for constipation.  ?Endocrine: Negative for excessive thirst.  ?Genitourinary:  Negative for difficulty urinating.  ?Musculoskeletal:  Positive for muscle weakness, morning stiffness and muscle tenderness.  ?Skin:  Positive for rash.  ?Allergic/Immunologic: Negative for susceptible to infections.  ?Neurological:  Positive for weakness.  ?Hematological:  Negative for bruising/bleeding tendency.  ?Psychiatric/Behavioral:  Negative for sleep disturbance.   ? ?PMFS History:  ?Patient Active Problem List  ? Diagnosis Date Noted  ? Positive ANA (antinuclear antibody) 11/15/2021  ? Elevated CK 11/15/2021  ? Elevated LFTs 10/10/2021  ? Muscle weakness 10/10/2021  ? Rash 10/10/2021  ? Cervical dystonia 07/19/2021  ? Gallstones 07/14/2021  ? Elevated bilirubin 07/14/2021  ? Abnormal head movements 07/14/2021  ? Vitamin D deficiency 10/30/2019  ? On statin therapy 08/26/2019  ? Elevated alkaline phosphatase level 08/26/2019  ? Hyperlipidemia 01/25/2016  ? Torticollis, acquired 07/27/2015  ? Mitral valve stenosis 03/20/2014  ?  Hypertension 04/04/2012  ? LBBB (left bundle branch block) 03/01/2012  ? Anxiety   ?  ?Past Medical History:  ?Diagnosis Date  ? Anxiety   ? Depression   ? Fibroadenoma of breast   ? H/O IN LEFT BREAST NO CHANGE PER [place]  ? Gallstones 2021  ? Heart murmur   ? mild mitral stenosis and aortic  stenosis by prior PCP note. Saw Dr. Mauricio Po in 2013-2015.unable to view echo or records surrounding condition.   ? High cholesterol   ? Hyperparathyroidism ([place]) 10/30/2019  ? Hypertension   ? LBBB (left bundle branch block) 2013  ? saw Dr. Mauricio Po - stable  ? Migraine headache   ? PMS (premenstrual syndrome)   ?  ?Family History  ?Problem Relation Age of Onset  ? Heart disease Mother   ? Hearing loss Mother   ? Heart disease Father   ? Hypertension Father   ? Stroke Father   ? Arthritis Father   ? Drug abuse Brother   ?     Commited suicide  ? Hypertension Maternal Grandfather   ? Hypertension Paternal Grandfather   ? Heart disease Paternal Grandfather   ? Diabetes Mellitus II Maternal Aunt   ? Diabetes Mellitus II Paternal Aunt   ? Heart disease Paternal Uncle   ? Colon cancer Neg Hx   ? Breast cancer Neg Hx   ? Hyperparathyroidism Neg Hx   ? ?Past Surgical History:  ?Procedure Laterality Date  ? ADENOIDECTOMY    ? CESAREAN SECTION    ? x2  ? ?Social History  ? ?Social History Narrative  ? Married. Spouse is name Suezanne Jacquet. 3 children.  ? High school graduate. Full-time employed. 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  ? Drinks caffeinated beverages, no tobacco, no alcohol, no recreational drugs.  ? Wears her seatbelt. Smoke detectors located in the home. Firearms located in the home in a locked case.  ? Feels safe in her relationship.  ?   ? Right Handed   ? Lives in a one story home  ? ?Immunization History  ?Administered Date(s) Administered  ? Influenza Inj Mdck Quad Pf 08/26/2018  ? Influenza, Quadrivalent, Recombinant, Inj, Pf 05/18/2019  ? Influenza,inj,Quad PF,6+ Mos 07/27/2015, 09/05/2016, 06/28/2020, 06/02/2021  ? Influenza-Unspecified 06/17/2013, 06/17/2014, 06/05/2017  ? PFIZER(Purple Top)SARS-COV-2 Vaccination 10/27/2019, 11/24/2019, 08/20/2020  ? Pension scheme manager 55yr & up 05/31/2021  ? Tdap 01/25/2016  ? Zoster Recombinat (Shingrix) 06/12/2018, 10/10/2018  ?  ? ?Objective: ?Vital Signs:  BP 128/77 (BP Location: Left Arm, Patient Position: Sitting, Cuff Size: Normal)   Pulse 89   Resp 15   Ht _0  (1.651 m)   Wt 154 lb (69.9 kg)   LMP 09/07/2017   BMI 25.63 kg/m?   ? ?Physical Exam ?Eyes:  ?   Conjunctiva/sclera: Conjunctivae normal.  ?Cardiovascular:  ?   Rate and Rhythm: Normal rate and regular rhythm.  ?Pulmonary:  ?   Effort: Pulmonary effort is normal.  ?   Breath sounds: Normal breath sounds.  ?Skin: ?   General: Skin is warm and dry.  ?   Findings: Rash present.  ?   Comments: Mildly erythematous patches on upper back no scale or lesions ?Erythema at base of fingernails on both hands, tortuous nailfold capillaries with some dropout ?No digital pitting  ?Neurological:  ?   Mental Status: She is alert.  ?Psychiatric:     ?   Mood and Affect: Mood normal.  ?  ? ?Musculoskeletal Exam:  ?  Neck held in partially rotated position with increased right sided muscle tone ?Shoulders difficulty in active overhead abduction, no focal tenderness or swelling ?Elbows full ROM no tenderness or swelling ?Wrists full ROM no tenderness or swelling ?Fingers full ROM no tenderness or swelling ?Knees full ROM no tenderness or swelling ?Ankles full ROM no tenderness or swelling ? ? ?Investigation: ?No additional findings. ? ?Imaging: ?No results found. ? ?Recent Labs: ?Lab Results  ?Component Value Date  ? WBC WILL FOLLOW 10/10/2021  ? HGB WILL FOLLOW 10/10/2021  ? PLT WILL FOLLOW 10/10/2021  ? NA 140 10/10/2021  ? K 3.6 10/10/2021  ? CL 99 10/10/2021  ? CO2 31 10/10/2021  ? GLUCOSE 102 (H) 10/10/2021  ? BUN 14 10/10/2021  ? CREATININE 0.63 10/10/2021  ? BILITOT 1.0 10/10/2021  ? ALKPHOS 140 (H) 10/10/2021  ? AST 87 (H) 10/10/2021  ? ALT 62 (H) 10/10/2021  ? PROT 6.8 10/10/2021  ? ALBUMIN 4.5 10/10/2021  ? CALCIUM 9.0 10/10/2021  ? GFRAA >89 09/05/2016  ? ? ?Speciality Comments: No specialty comments available. ? ?Procedures:  ?No procedures performed ?Allergies: Penicillins, Sulfa antibiotics, and Zoloft  [sertraline hcl]  ? ?Assessment / Plan:     ?Visit Diagnoses: Elevated CK ?Positive ANA (antinuclear antibody) ? ?Moderately high positive ANA antibody titer despite no specific biomarkers could be consistent with pr

## 2021-12-01 ENCOUNTER — Ambulatory Visit (INDEPENDENT_AMBULATORY_CARE_PROVIDER_SITE_OTHER): Payer: 59 | Admitting: Internal Medicine

## 2021-12-01 ENCOUNTER — Encounter: Payer: Self-pay | Admitting: Internal Medicine

## 2021-12-01 VITALS — BP 128/77 | HR 89 | Resp 15 | Ht 65.0 in | Wt 154.0 lb

## 2021-12-01 DIAGNOSIS — R748 Abnormal levels of other serum enzymes: Secondary | ICD-10-CM

## 2021-12-01 DIAGNOSIS — R768 Other specified abnormal immunological findings in serum: Secondary | ICD-10-CM | POA: Diagnosis not present

## 2021-12-01 DIAGNOSIS — R21 Rash and other nonspecific skin eruption: Secondary | ICD-10-CM

## 2021-12-12 ENCOUNTER — Other Ambulatory Visit (HOSPITAL_COMMUNITY): Payer: Self-pay

## 2021-12-13 ENCOUNTER — Telehealth: Payer: Self-pay | Admitting: Pharmacy Technician

## 2021-12-13 NOTE — Telephone Encounter (Addendum)
Received notification from  [employer]  regarding a prior authorization for  Botox 100 unit vial . Authorization has been APPROVED from 12/12/21 to 12/13/22.   Approved up to 4 vials every 12 weeks (4 visits)  Scanned into chart  Patient must fill through [place] # [id number] Phone # [phone]

## 2021-12-13 NOTE — Telephone Encounter (Signed)
Submitted a Prior Authorization request to  [facility]/ [employer]  for  Botox 100 unit vials  via Phone. Will update once we receive a response. ? ? ?Phone# [phone] ?Auth# [id number] ?  ?

## 2021-12-15 ENCOUNTER — Ambulatory Visit: Payer: 59 | Attending: Family Medicine

## 2021-12-15 DIAGNOSIS — M542 Cervicalgia: Secondary | ICD-10-CM | POA: Insufficient documentation

## 2021-12-15 DIAGNOSIS — M6281 Muscle weakness (generalized): Secondary | ICD-10-CM | POA: Diagnosis present

## 2021-12-15 DIAGNOSIS — G243 Spasmodic torticollis: Secondary | ICD-10-CM | POA: Insufficient documentation

## 2021-12-15 NOTE — Therapy (Signed)
?OUTPATIENT PHYSICAL THERAPY NEURO EVALUATION ? ? ?Patient Name: Alexandria Weber ?MRN: [id number] ?DOB:1966/01/23, 56 y.o., female ?Today's Date: 12/15/2021 ? ?PCP: Ma Hillock, DO ?REFERRING PROVIDER: Ludwig Clarks, DO ? ? PT End of Session - 12/15/21 1740   ? ? Visit Number 1   ? Number of Visits 6   ? Date for PT Re-Evaluation 02/09/22   ? Authorization Type UHC 2023   ? Authorization Time Period no auth, 100 VL   ? Authorization - Visit Number 1   ? Authorization - Number of Visits 100   ? PT Start Time 2671   ? PT Stop Time 1530   ? PT Time Calculation (min) 45 min   ? Activity Tolerance Patient tolerated treatment well   ? Behavior During Therapy [facility] for tasks assessed/performed   ? ?  ?  ? ?  ? ? ?Past Medical History:  ?Diagnosis Date  ? Anxiety   ? Depression   ? Fibroadenoma of breast   ? H/O IN LEFT BREAST NO CHANGE PER [place]  ? Gallstones 2021  ? Heart murmur   ? mild mitral stenosis and aortic stenosis by prior PCP note. Saw Dr. Mauricio Po in 2013-2015.unable to view echo or records surrounding condition.   ? High cholesterol   ? Hyperparathyroidism ([place]) 10/30/2019  ? Hypertension   ? LBBB (left bundle branch block) 2013  ? saw Dr. Mauricio Po - stable  ? Migraine headache   ? PMS (premenstrual syndrome)   ? ?Past Surgical History:  ?Procedure Laterality Date  ? ADENOIDECTOMY    ? CESAREAN SECTION    ? x2  ? ?Patient Active Problem List  ? Diagnosis Date Noted  ? Positive ANA (antinuclear antibody) 11/15/2021  ? Elevated CK 11/15/2021  ? Elevated LFTs 10/10/2021  ? Muscle weakness 10/10/2021  ? Rash 10/10/2021  ? Cervical dystonia 07/19/2021  ? Gallstones 07/14/2021  ? Elevated bilirubin 07/14/2021  ? Abnormal head movements 07/14/2021  ? Vitamin D deficiency 10/30/2019  ? On statin therapy 08/26/2019  ? Elevated alkaline phosphatase level 08/26/2019  ? Hyperlipidemia 01/25/2016  ? Torticollis, acquired 07/27/2015  ? Mitral valve stenosis 03/20/2014  ? Hypertension 04/04/2012  ? LBBB (left bundle branch  block) 03/01/2012  ? Anxiety   ? ? ?ONSET DATE: 11/25/21 ? ?REFERRING DIAG:  ? ? ?THERAPY DIAG:  ?Muscle weakness (generalized) ? ?Cervicalgia ? ?SUBJECTIVE:  ?                                                                                                                                                                                           ? ?SUBJECTIVE STATEMENT: ?Cervical  dystonia with gradual onset since 2014 and has been receiving Botox injections with most recent set being 3 weeks ago. Receives injections to left trap and both sides of neck. Has received PT for this issue in the past but this was previous to starting Botox injections. Denies radiating arm pain and notes difficulty with head/neck control when trying to perform seated, focused work ?Pt accompanied by: self ? ?PERTINENT HISTORY: Cervical dystonia ? ?PAIN:  ?Are you having pain? No ? ?PRECAUTIONS: None ? ?WEIGHT BEARING RESTRICTIONS No ? ?FALLS: Has patient fallen in last 6 months? No ? ?LIVING ENVIRONMENT: ?Lives with: lives with their family ?Lives in: House/apartment ?Stairs: No ?Has following equipment at home: None ? ?PLOF: Independent ? ?PATIENT GOALS: Maintain a stable head/neck when sitting at the computer for work ? ?OBJECTIVE:  ? ? ? ?COGNITION: ?Overall cognitive status: Within functional limits for tasks assessed ?  ?SENSATION: ?WFL ? ?COORDINATION: ?WNL ? ? ? ?MUSCLE TONE: increased tone/resistance to stretch noted left cervical column ? ? ?POSTURE:  head rotation right ? ?LE ROM:    ? ?Cervical: left rotation 25% limited ?  Right rotation 10% limited ?  Left sidebending 25% limited ?  Right sidebending 10% limited ? ? ?Bed mobility, transfers, and gait performed independently ? ?TODAY'S TREATMENT:  ?Evaluation, extension SNAG, rotation SNAG, chin retraction, levator scap stretch ? ? ?PATIENT EDUCATION: ?Education details: sensory tricks/positioning techniques specific to cervical dystonia ?Person educated: Patient ?Education  method: Explanation and Demonstration ?Education comprehension: verbalized understanding ? ? ?HOME EXERCISE PROGRAM: ?xtension SNAG, rotation SNAG, chin retraction, levator scap stretch ?Access Code [email or web address] ? ? ? ?GOALS: ?Goals reviewed with patient? Yes ? ?SHORT TERM GOALS: Target date: 01/05/2022    ? ?Patient will be independent in HEP to improve functional outcomes ?Baseline: ?Goal status: INITIAL ? ?2.  Pt to be independent in strategies to maintain stable head/neck when sitting at desk x 30 minutes to improve work performance ?Baseline:  ?Goal status: INITIAL ? ? ? ?LONG TERM GOALS: Target date: 01/26/2022    ? ?Demonstrate full cervical rotation ROM to enable greater comfort/safety when driving ?Baseline: 10-25% limited ?Goal status: INITIAL ? ? ? ?ASSESSMENT: ? ?CLINICAL IMPRESSION: ?Patient is a 56 y.o. lady who was seen today for physical therapy evaluation and treatment for cervical dystonia and resultant cervical ROM and motor control deficits leading to difficulty in performing desk work and driving. PT services indicated to train/instruct in techniques to improve cervical spine ROM, motor control/coordination techniques, and pt education to facilitate greater independence in activities and improved quality of life.   ? ? ?OBJECTIVE IMPAIRMENTS decreased knowledge of condition, decreased ROM, increased fascial restrictions, increased muscle spasms, impaired flexibility, impaired tone, and postural dysfunction.  ? ?ACTIVITY LIMITATIONS driving and occupation.  ? ?PERSONAL FACTORS Past/current experiences, Time since onset of injury/illness/exacerbation, and 1 comorbidity: dystonia  are also affecting patient's functional outcome.  ? ? ?REHAB POTENTIAL: Good ? ?CLINICAL DECISION MAKING: Evolving/moderate complexity ? ?EVALUATION COMPLEXITY: Moderate ? ?PLAN: ?PT FREQUENCY: 1x/week ? ?PT DURATION: 6 weeks ? ?PLANNED INTERVENTIONS: Therapeutic exercises, Therapeutic activity, Neuromuscular re-education,  Balance training, Gait training, Patient/Family education, Joint manipulation, Joint mobilization, Vestibular training, Orthotic/Fit training, Dry Needling, Electrical stimulation, Spinal mobilization, Cryotherapy, Moist heat, Taping, Traction, and Manual therapy ? ?PLAN FOR NEXT SESSION: HEP review, manual stretching, sensory tricks ? ?5:54 PM, 12/15/21 ?Celedonio Miyamoto, PT, DPT ?Physical Therapist- [place] ?Office Number: [phone] ? ? ? ? ? ? ?  ?

## 2021-12-21 ENCOUNTER — Ambulatory Visit: Payer: 59

## 2021-12-21 DIAGNOSIS — M542 Cervicalgia: Secondary | ICD-10-CM

## 2021-12-21 DIAGNOSIS — M6281 Muscle weakness (generalized): Secondary | ICD-10-CM

## 2021-12-21 NOTE — Therapy (Signed)
?OUTPATIENT PHYSICAL THERAPY TREATMENT NOTE ? ? ?Patient Name: Alexandria Weber ?MRN: [id number] ?DOB:[birth year], 56 y.o., female ?Today's Date: 12/21/2021 ? ?PCP: Ma Hillock, DO ?REFERRING PROVIDER: Ludwig Clarks, DO ? ?END OF SESSION:  ? PT End of Session - 12/21/21 1421   ? ? Visit Number 2   ? Number of Visits 6   ? Date for PT Re-Evaluation 02/09/22   ? Authorization Type UHC 2023   ? Authorization Time Period no auth, 100 VL   ? Authorization - Visit Number 2   ? Authorization - Number of Visits 100   ? PT Start Time 1410   ? PT Stop Time 1450   ? PT Time Calculation (min) 40 min   ? Activity Tolerance Patient tolerated treatment well   ? Behavior During Therapy [facility] for tasks assessed/performed   ? ?  ?  ? ?  ? ? ?Past Medical History:  ?Diagnosis Date  ? Anxiety   ? Depression   ? Fibroadenoma of breast   ? H/O IN LEFT BREAST NO CHANGE PER [place]  ? Gallstones 2021  ? Heart murmur   ? mild mitral stenosis and aortic stenosis by prior PCP note. Saw Dr. Mauricio Po in 2013-2015.unable to view echo or records surrounding condition.   ? High cholesterol   ? Hyperparathyroidism ([place]) 10/30/2019  ? Hypertension   ? LBBB (left bundle branch block) 2013  ? saw Dr. Mauricio Po - stable  ? Migraine headache   ? PMS (premenstrual syndrome)   ? ?Past Surgical History:  ?Procedure Laterality Date  ? ADENOIDECTOMY    ? CESAREAN SECTION    ? x2  ? ?Patient Active Problem List  ? Diagnosis Date Noted  ? Positive ANA (antinuclear antibody) 11/15/2021  ? Elevated CK 11/15/2021  ? Elevated LFTs 10/10/2021  ? Muscle weakness 10/10/2021  ? Rash 10/10/2021  ? Cervical dystonia 07/19/2021  ? Gallstones 07/14/2021  ? Elevated bilirubin 07/14/2021  ? Abnormal head movements 07/14/2021  ? Vitamin D deficiency 10/30/2019  ? On statin therapy 08/26/2019  ? Elevated alkaline phosphatase level 08/26/2019  ? Hyperlipidemia 01/25/2016  ? Torticollis, acquired 07/27/2015  ? Mitral valve stenosis 03/20/2014  ? Hypertension 04/04/2012  ? LBBB (left  bundle branch block) 03/01/2012  ? Anxiety   ? ? ?REFERRING DIAG: Cervical dystonia ? ?THERAPY DIAG:  ?Muscle weakness (generalized) ? ?Cervicalgia ? ?PERTINENT HISTORY: see PMH ? ?PRECAUTIONS:  ? ?SUBJECTIVE: A little soreness from using towel for rotation stretch ? ?PAIN:  ?Are you having pain? No ? ? ?OBJECTIVE:  ?  ?  ?  ?COGNITION: ?Overall cognitive status: Within functional limits for tasks assessed ?            ?SENSATION: ?WFL ?  ?COORDINATION: ?WNL ?  ?  ?  ?MUSCLE TONE: increased tone/resistance to stretch noted left cervical column ?  ?  ?POSTURE:  head rotation right ?  ?LE ROM:    ?  ?Cervical: left rotation 25% limited ?                     Right rotation 10% limited ?                     Left sidebending 25% limited ?                     Right sidebending 10% limited ?  ?  ?Bed mobility, transfers, and gait performed independently ?  ?TODAY'S TREATMENT:  ? ?  TODAY'S TREATMENT: 12/21/21 ?Activity Comments  ?HEP review: Cervical rotation/extension SNAG    ?Chin retractions 10x 3 sec   ?Supine cervical extension isometric 2x10, 3 sec hold   ?Supine cervical flexion 2x10 1-2" lift-off  ?Manual therapy: STM and passive, static stretching to bilateral neck left upper trap > right to improve midline orientation   ?   ?  ?  ?  ?PATIENT EDUCATION: ?Education details:HEP review and new additions ?Person educated: Patient ?Education method: Explanation and Demonstration ?Education comprehension: verbalized understanding ?  ?  ?HOME EXERCISE PROGRAM: ?xtension SNAG, rotation SNAG, chin retraction, levator scap stretch ?Access Code [email or web address] ?Cervical extension isometric (supine) 2x10 ?Cervical flexion (1-2" lift) in supine 2x10 ?  ?  ?  ?GOALS: ?Goals reviewed with patient? Yes ?  ?SHORT TERM GOALS: Target date: 01/05/2022    ?  ?Patient will be independent in HEP to improve functional outcomes ?Baseline: ?Goal status: INITIAL ?  ?2.  Pt to be independent in strategies to maintain stable head/neck when sitting at  desk x 30 minutes to improve work performance ?Baseline:  ?Goal status: INITIAL ?  ?  ?  ?LONG TERM GOALS: Target date: 01/26/2022    ?  ?Demonstrate full cervical rotation ROM to enable greater comfort/safety when driving ?Baseline: 10-25% limited ?Goal status: INITIAL ?  ?  ?  ?ASSESSMENT: ?  ?CLINICAL IMPRESSION: ?Able to demo excellent HEP recall with some guidance/review needed to perform rotation SNAG to proper form. Limited left rotation > right rotation. Tolerated session well with continued sessions indicated to progress postural/cervical strength to improve head/neck control and midline positioning for stability ?  ?  ?OBJECTIVE IMPAIRMENTS decreased knowledge of condition, decreased ROM, increased fascial restrictions, increased muscle spasms, impaired flexibility, impaired tone, and postural dysfunction.  ?  ?ACTIVITY LIMITATIONS driving and occupation.  ?  ?PERSONAL FACTORS Past/current experiences, Time since onset of injury/illness/exacerbation, and 1 comorbidity: dystonia  are also affecting patient's functional outcome.  ?  ?  ?REHAB POTENTIAL: Good ?  ?CLINICAL DECISION MAKING: Evolving/moderate complexity ?  ?EVALUATION COMPLEXITY: Moderate ?  ?PLAN: ?PT FREQUENCY: 1x/week ?  ?PT DURATION: 6 weeks ?  ?PLANNED INTERVENTIONS: Therapeutic exercises, Therapeutic activity, Neuromuscular re-education, Balance training, Gait training, Patient/Family education, Joint manipulation, Joint mobilization, Vestibular training, Orthotic/Fit training, Dry Needling, Electrical stimulation, Spinal mobilization, Cryotherapy, Moist heat, Taping, Traction, and Manual therapy ?  ?PLAN FOR NEXT SESSION: HEP review, manual stretching, sensory tricks ? ? ?3:04 PM, 12/21/21 ?Celedonio Miyamoto, PT, DPT ?Physical Therapist- [place] ?Office Number: [phone] ? ?   ?

## 2021-12-27 ENCOUNTER — Ambulatory Visit: Payer: 59

## 2021-12-27 DIAGNOSIS — M6281 Muscle weakness (generalized): Secondary | ICD-10-CM | POA: Diagnosis not present

## 2021-12-27 DIAGNOSIS — M542 Cervicalgia: Secondary | ICD-10-CM

## 2021-12-27 NOTE — Therapy (Signed)
OUTPATIENT PHYSICAL THERAPY TREATMENT NOTE   Patient Name: Alexandria Weber MRN: [id number] DOB:May 24, 1966, 56 y.o., female Today's Date: 12/27/2021  PCP: Ma Hillock, DO REFERRING PROVIDER: Ludwig Clarks, DO  END OF SESSION:   PT End of Session - 12/27/21 1356     Visit Number 3    Number of Visits 6    Date for PT Re-Evaluation 02/09/22    Authorization Type UHC 2023    Authorization Time Period no auth, 100 VL    Authorization - Visit Number 3    Authorization - Number of Visits 100    PT Start Time 1400    PT Stop Time 1445    PT Time Calculation (min) 45 min    Activity Tolerance Patient tolerated treatment well    Behavior During Therapy WFL for tasks assessed/performed             Past Medical History:  Diagnosis Date   Anxiety    Depression    Fibroadenoma of breast    H/O IN LEFT BREAST NO CHANGE PER [place]   Gallstones 2021   Heart murmur    mild mitral stenosis and aortic stenosis by prior PCP note. Saw Dr. Mauricio Po in 2013-2015.unable to view echo or records surrounding condition.    High cholesterol    Hyperparathyroidism ([place]) 10/30/2019   Hypertension    LBBB (left bundle branch block) 2013   saw Dr. Mauricio Po - stable   Migraine headache    PMS (premenstrual syndrome)    Past Surgical History:  Procedure Laterality Date   ADENOIDECTOMY     CESAREAN SECTION     x2   Patient Active Problem List   Diagnosis Date Noted   Positive ANA (antinuclear antibody) 11/15/2021   Elevated CK 11/15/2021   Elevated LFTs 10/10/2021   Muscle weakness 10/10/2021   Rash 10/10/2021   Cervical dystonia 07/19/2021   Gallstones 07/14/2021   Elevated bilirubin 07/14/2021   Abnormal head movements 07/14/2021   Vitamin D deficiency 10/30/2019   On statin therapy 08/26/2019   Elevated alkaline phosphatase level 08/26/2019   Hyperlipidemia 01/25/2016   Torticollis, acquired 07/27/2015   Mitral valve stenosis 03/20/2014   Hypertension 04/04/2012   LBBB (left  bundle branch block) 03/01/2012   Anxiety     REFERRING DIAG: Cervical dystonia  THERAPY DIAG:  Muscle weakness (generalized)  Cervicalgia  PERTINENT HISTORY: see PMH  PRECAUTIONS:   SUBJECTIVE: Some sorenes in left upper trap, possibly from exercise  PAIN:  Are you having pain? No   OBJECTIVE:        COGNITION: Overall cognitive status: Within functional limits for tasks assessed             SENSATION: WFL   COORDINATION: WNL       MUSCLE TONE: increased tone/resistance to stretch noted left cervical column     POSTURE:  head rotation right   LE ROM:      Cervical: left rotation 25% limited                      Right rotation 10% limited                      Left sidebending 25% limited                      Right sidebending 10% limited     Bed mobility, transfers, and gait performed independently   TODAY'S TREATMENT:  TODAY'S TREATMENT: 12/27/21 Activity Comments  HEP review: Cervical rotation/extension SNAG    Chin retractions 10x 3 sec   Supine cervical extension isometric 2x10, 3 sec hold   Prone Y's, T's 1 lbs 1x10   Supine cervical flexion 2x10 1-2" lift-off, 3 sec  Manual therapy completed separate from all other interventions: passive ROM/static stretching to cervical spine to improve ROM and flexibility Left rotation < right rotation        PATIENT EDUCATION: Education details:HEP review and new additions Person educated: Patient Education method: Customer service manager Education comprehension: verbalized understanding     HOME EXERCISE PROGRAM: xtension SNAG, rotation SNAG, chin retraction, levator scap stretch Access Code [email or web address] Cervical extension isometric (supine) 2x10 Cervical flexion (1-2" lift) in supine 2x10       GOALS: Goals reviewed with patient? Yes   SHORT TERM GOALS: Target date: 01/05/2022      Patient will be independent in HEP to improve functional outcomes Baseline: Goal status: INITIAL   2.  Pt  to be independent in strategies to maintain stable head/neck when sitting at desk x 30 minutes to improve work performance Baseline:  Goal status: INITIAL       LONG TERM GOALS: Target date: 01/26/2022      Demonstrate full cervical rotation ROM to enable greater comfort/safety when driving Baseline: 59-45% limited Goal status: INITIAL       ASSESSMENT:   CLINICAL IMPRESSION: Good HEP recall with ROM and postural exercises. Initiated scapular strengthening with emphasis on middle and lower fibers of trapezius as her dystonia presents with hypertonia affecting upper trap, and as such strengthening focus on retraction/depression to improve balance and prevent subsequent issues of imbalance. Continued sessions for additional pt education in mgmt techniques and strengthening exercises to improve neck/UE function     OBJECTIVE IMPAIRMENTS decreased knowledge of condition, decreased ROM, increased fascial restrictions, increased muscle spasms, impaired flexibility, impaired tone, and postural dysfunction.    ACTIVITY LIMITATIONS driving and occupation.    PERSONAL FACTORS Past/current experiences, Time since onset of injury/illness/exacerbation, and 1 comorbidity: dystonia  are also affecting patient's functional outcome.      REHAB POTENTIAL: Good   CLINICAL DECISION MAKING: Evolving/moderate complexity   EVALUATION COMPLEXITY: Moderate   PLAN: PT FREQUENCY: 1x/week   PT DURATION: 6 weeks   PLANNED INTERVENTIONS: Therapeutic exercises, Therapeutic activity, Neuromuscular re-education, Balance training, Gait training, Patient/Family education, Joint manipulation, Joint mobilization, Vestibular training, Orthotic/Fit training, Dry Needling, Electrical stimulation, Spinal mobilization, Cryotherapy, Moist heat, Taping, Traction, and Manual therapy   PLAN FOR NEXT SESSION: HEP review, manual stretching, sensory tricks   1:57 PM, 12/27/21 M. Sherlyn Lees, PT, DPT Physical Therapist-  [place] Office Number: [phone]

## 2021-12-28 ENCOUNTER — Encounter: Payer: Self-pay | Admitting: Family Medicine

## 2021-12-28 ENCOUNTER — Ambulatory Visit (INDEPENDENT_AMBULATORY_CARE_PROVIDER_SITE_OTHER): Payer: 59 | Admitting: Family Medicine

## 2021-12-28 VITALS — BP 130/76 | HR 76 | Temp 97.7°F | Ht 65.0 in | Wt 154.0 lb

## 2021-12-28 DIAGNOSIS — Z79899 Other long term (current) drug therapy: Secondary | ICD-10-CM

## 2021-12-28 DIAGNOSIS — M6281 Muscle weakness (generalized): Secondary | ICD-10-CM

## 2021-12-28 DIAGNOSIS — M436 Torticollis: Secondary | ICD-10-CM | POA: Diagnosis not present

## 2021-12-28 DIAGNOSIS — F419 Anxiety disorder, unspecified: Secondary | ICD-10-CM | POA: Diagnosis not present

## 2021-12-28 DIAGNOSIS — E782 Mixed hyperlipidemia: Secondary | ICD-10-CM

## 2021-12-28 DIAGNOSIS — R768 Other specified abnormal immunological findings in serum: Secondary | ICD-10-CM

## 2021-12-28 DIAGNOSIS — R748 Abnormal levels of other serum enzymes: Secondary | ICD-10-CM

## 2021-12-28 DIAGNOSIS — G243 Spasmodic torticollis: Secondary | ICD-10-CM

## 2021-12-28 DIAGNOSIS — I10 Essential (primary) hypertension: Secondary | ICD-10-CM

## 2021-12-28 MED ORDER — VENLAFAXINE HCL ER 150 MG PO CP24: 150.0000 mg | ORAL_CAPSULE | Freq: Every day | ORAL | 1 refills | Status: DC

## 2021-12-28 MED ORDER — PROPRANOLOL HCL 20 MG PO TABS
20.0000 mg | ORAL_TABLET | Freq: Two times a day (BID) | ORAL | 5 refills | Status: DC | PRN
Start: 2021-12-28 — End: 2022-11-15

## 2021-12-28 MED ORDER — AMLODIPINE BESYLATE 2.5 MG PO TABS: 2.5000 mg | ORAL_TABLET | Freq: Every day | ORAL | 1 refills | Status: DC

## 2021-12-28 NOTE — Progress Notes (Signed)
Patient ID: Alexandria Weber, female  DOB: [birth year], 56 y.o.   MRN: [id number] Patient Care Team    Relationship Specialty Notifications Start End  Ma Hillock, DO PCP - General Family Medicine  07/27/15   [facility], [facility] Ob/Gyn    07/13/21   Tat, Eustace Quail, DO Consulting Physician Neurology  07/19/21     Chief Complaint  Patient presents with   Hypertension    [place]; pt is not fasting    Subjective: Alexandria Weber is a 56 y.o.  Female  present for [facility]. All past medical history, surgical history, allergies, family history, immunizations, medications and social history were updated in the electronic medical record today. All recent labs, ED visits and hospitalizations within the last year were reviewed.  Essential hypertension/HLD/on statin therapy Pt reports compliance with amlodipine 2.5 mg QD and Lipitor.Patient denies chest pain, shortness of breath, dizziness or lower extremity edema.  Diet: High-fiber diet low saturated fat, lower sodium Exercise: Not performing routine exercises RF: Hypertension, no history of heart disease and stroke   Torticollis, acquired/Agoraphobia/Anxiety Patient reports compliance  with Effexor 75 mg and rarely uses valium. She has been receiving Botox injections for her torticollis with Dr. Carles Collet.    Elevated alk phos: Patient presents today for follow-up on her continued abnormal alkaline phosphatase since November 2020.  Alk phos has slowly declined as the vitamin D and calcium levels have been supplemented into normal range.  Vitamin D had been rather significantly low initially at 10.  In attempting to decipher if alk phos was related to bone or other cause, GGT was found to be mildly elevated as well. She is still asymptomatic. Gallstones were present  on [place]. Stopped oral calcium and continued vit d.  Rash/weakness: Rash has resolved. Weakness improved in upper ext, but remains in thighs. Established w/ Rheum.      10/10/2021    10:28 AM 02/10/2021    9:35 AM 06/28/2020    8:31 AM 06/24/2019    8:10 AM 12/11/2018    8:21 AM  Depression screen PHQ 2/9  Decreased Interest 0 0 0 0 0  Down, Depressed, Hopeless 0 0 0 0 0  PHQ - 2 Score 0 0 0 0 0  Altered sleeping  0  0 1  Tired, decreased energy  0  0 0  Change in appetite  0  0 0  Feeling bad or failure about yourself   0  0 0  Trouble concentrating  0  0 0  Moving slowly or fidgety/restless  0  0 0  Suicidal thoughts  0  0 0  PHQ-9 Score  0  0 1  Difficult doing work/chores    Not difficult at all Not difficult at all      02/10/2021    9:35 AM 06/24/2019    8:10 AM 12/11/2018    8:20 AM 01/18/2018    8:06 AM  GAD 7 : Generalized Anxiety Score  Nervous, Anxious, on Edge 0 0 0 1  Control/stop worrying 0 0 0 0  Worry too much - different things 0 0 0 0  Trouble relaxing 0 1 0 0  Restless 0 0 0 1  Easily annoyed or irritable 0 0 0 0  Afraid - awful might happen 0 0 0 0  Total GAD 7 Score 0 1 0 2  Anxiety Difficulty  Not difficult at all Not difficult at all     Immunization History  Administered Date(s) Administered  Influenza Inj Mdck Quad Pf 08/26/2018   Influenza, Quadrivalent, Recombinant, Inj, Pf 05/18/2019   Influenza,inj,Quad PF,6+ Mos 07/27/2015, 09/05/2016, 06/28/2020, 06/02/2021   Influenza-Unspecified 06/17/2013, 06/17/2014, 06/05/2017   PFIZER(Purple Top)SARS-COV-2 Vaccination 10/27/2019, 11/24/2019, 08/20/2020   Pfizer Covid-19 Vaccine Bivalent Booster 77yr & up 05/31/2021   Tdap 01/25/2016   Zoster Recombinat (Shingrix) 06/12/2018, 10/10/2018    Past Medical History:  Diagnosis Date   Anxiety    Depression    Fibroadenoma of breast    H/O IN LEFT BREAST NO CHANGE PER [place]   Gallstones 2021   Heart murmur    mild mitral stenosis and aortic stenosis by prior PCP note. Saw Dr. rMauricio Poin 2013-2015.unable to view echo or records surrounding condition.    High cholesterol    [place]par[place] 10/30/2019   Hypertension    LBBB  (left bundle branch block) 2013   saw Dr. RMauricio Po- stable   Migraine headache    PMS (premenstrual syndrome)    Allergies  Allergen Reactions   Penicillins Hives   Sulfa Antibiotics Hives   Zoloft [Sertraline Hcl]    Past Surgical History:  Procedure Laterality Date   ADENOIDECTOMY     CESAREAN SECTION     x2   Family History  Problem Relation Age of Onset   Heart disease Mother    Hearing loss Mother    Heart disease Father    Hypertension Father    Stroke Father    Arthritis Father    Drug abuse Brother        Commited suicide   Hypertension Maternal Grandfather    Hypertension Paternal Grandfather    Heart disease Paternal Grandfather    Diabetes Mellitus II Maternal Aunt    Diabetes Mellitus II Paternal Aunt    Heart disease Paternal Uncle    Colon cancer Neg Hx    Breast cancer Neg Hx    Hyperparathyroidism Neg Hx    Social History   Social History Narrative   Married. Spouse is name BSuezanne Weber 3 children.   High school graduate. Full-time employed. 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   Drinks caffeinated beverages, no tobacco, no alcohol, no recreational drugs.   Wears her seatbelt. Smoke detectors located in the home. Firearms located in the home in a locked case.   Feels safe in her relationship.      Right Handed    Lives in a one story home    Allergies as of 12/28/2021       Reactions   Penicillins Hives   Sulfa Antibiotics Hives   Zoloft [sertraline Hcl]         Medication List        Accurate as of Dec 28, 2021  8:42 AM. If you have any questions, ask your nurse or doctor.          STOP taking these medications    atorvastatin 10 MG tablet Commonly known as: LIPITOR Stopped by: RHoward Pouch DO   baclofen 10 MG tablet Commonly known as: LIORESAL Stopped by: RHoward Pouch DO   predniSONE 20 MG tablet Commonly known as: DELTASONE Stopped by: RHoward Pouch DO       TAKE these medications    amLODipine 2.5 MG tablet Commonly  known as: NORVASC Take 1 tablet (2.5 mg total) by mouth daily.   Botox 100 units Solr injection Generic drug: botulinum toxin Type A INJECT 300 UNITS INTO THE HEAD AND NECK AREA EVERY 90 DAYS FOR CERVICAL DYSTONIA. What changed: Another  medication with the same name was removed. Continue taking this medication, and follow the directions you see here. Changed by: Howard Pouch, DO   Dialyvite Vitamin D 5000 125 MCG (5000 UT) capsule Generic drug: Cholecalciferol Take 1 capsule (5,000 Units total) by mouth daily.   latanoprost 0.005 % ophthalmic solution Commonly known as: XALATAN Apply to eye.   propranolol 20 MG tablet Commonly known as: INDERAL Take 1 tablet (20 mg total) by mouth 2 (two) times daily as needed. Started by: Howard Pouch, DO   triamcinolone cream 0.1 % Commonly known as: KENALOG Apply topically 2 (two) times daily.   venlafaxine XR 150 MG 24 hr capsule Commonly known as: EFFEXOR-XR Take 1 capsule (150 mg total) by mouth daily with breakfast. What changed:  medication strength how much to take Changed by: Howard Pouch, DO        All past medical history, surgical history, allergies, family history, immunizations andmedications were updated in the EMR today and reviewed under the history and medication portions of their EMR.       ROS 14 pt review of systems performed and negative (unless mentioned in an HPI)  Objective: BP 130/76   Pulse 76   Temp 97.7 F (36.5 C) (Oral)   Ht 5' 5"  (1.651 m)   Wt 154 lb (69.9 kg)   LMP 09/07/2017   SpO2 98%   BMI 25.63 kg/m  Physical Exam Vitals and nursing note reviewed.  Constitutional:      General: She is not in acute distress.    Appearance: Normal appearance. She is not ill-appearing, toxic-appearing or diaphoretic.  HENT:     Head: Normocephalic and atraumatic.     Mouth/Throat:     Mouth: Mucous membranes are moist.  Eyes:     General: No scleral icterus.       Right eye: No discharge.         Left eye: No discharge.     Extraocular Movements: Extraocular movements intact.     Conjunctiva/sclera: Conjunctivae normal.     Pupils: Pupils are equal, round, and reactive to light.  Cardiovascular:     Rate and Rhythm: Normal rate and regular rhythm.  Pulmonary:     Effort: Pulmonary effort is normal. No respiratory distress.     Breath sounds: Normal breath sounds. No wheezing, rhonchi or rales.  Musculoskeletal:     Cervical back: Neck supple. No tenderness.     Right lower leg: No edema.     Left lower leg: No edema.  Lymphadenopathy:     Cervical: No cervical adenopathy.  Skin:    General: Skin is warm and dry.     Findings: No rash.  Neurological:     Mental Status: She is alert and oriented to person, place, and time. Mental status is at baseline.  Psychiatric:        Mood and Affect: Mood normal.        Behavior: Behavior normal.        Thought Content: Thought content normal.        Judgment: Judgment normal.     No results found.  Assessment/plan: Miguelina Fore is a 56 y.o. female present for CPE/CMC Elevated alkaline phosphatase level/vit d def/low calcium/hyperparthyroid/elevated GGT/estrogen deficient Patient is still asymptomatic. She has continue to supplement with vitamin D.  Last parathyroid hormone levels were normal once vitamin D levels normalized. Gallstones are present on ultrasound and are the most likely cause for her continued elevated alk phos.  She had elected to not pursue surgical consultation as of yet. Vitamin D UTD -DEXA up-to-date 12/07/2020-normal   Essential hypertension/HLD/on statin therapy Stable.  Continue  amlodipine 2.5 mg qd Continue to work on higher fiber diet and routine exercise.  Continue atorvastatin 10 mg daily.  Pt desires MWF dosing if able after results.  Labs UTD   Torticollis, acquired -stable - continue follow ups/botox with Dr. Carles Collet   Anxiety Would like increase in meds.  Eye pressures are up> poss  related to effexor> decrease dose at that time" > ophthalmologist> not related to effexor  -increase effexor to 75 mg QD> 150 mg qd. -start propanolol 20 mg qd prn for anxiety. (Pt asked for med)   Return in about 7 months (around 07/17/2022) for cpe (20 min), Routine chronic condition follow-up.  No orders of the defined types were placed in this encounter.   Meds ordered this encounter  Medications   amLODipine (NORVASC) 2.5 MG tablet    Sig: Take 1 tablet (2.5 mg total) by mouth daily.    Dispense:  90 tablet    Refill:  1   venlafaxine XR (EFFEXOR-XR) 150 MG 24 hr capsule    Sig: Take 1 capsule (150 mg total) by mouth daily with breakfast.    Dispense:  90 capsule    Refill:  1   propranolol (INDERAL) 20 MG tablet    Sig: Take 1 tablet (20 mg total) by mouth 2 (two) times daily as needed.    Dispense:  30 tablet    Refill:  5    Referral Orders  No referral(s) requested today      Electronically signed by: Howard Pouch, [place]

## 2021-12-28 NOTE — Patient Instructions (Signed)
Return in about 7 months (around 07/17/2022).        Great to see you today.  I have refilled the medication(s) we provide.   If labs were collected, we will inform you of lab results once received either by echart message or telephone call.   - echart message- for normal results that have been seen by the patient already.   - telephone call: abnormal results or if patient has not viewed results in their echart.

## 2022-01-04 ENCOUNTER — Ambulatory Visit: Payer: 59

## 2022-01-04 ENCOUNTER — Telehealth: Payer: Self-pay | Admitting: Neurology

## 2022-01-04 DIAGNOSIS — M542 Cervicalgia: Secondary | ICD-10-CM

## 2022-01-04 DIAGNOSIS — M6281 Muscle weakness (generalized): Secondary | ICD-10-CM

## 2022-01-04 NOTE — Telephone Encounter (Signed)
-----   Message from Kline, DO sent at 11/25/2021  8:36 AM EDT ----- Call patient and see how she did with last round of botox with increased dosage, along with PT for the neck

## 2022-01-04 NOTE — Telephone Encounter (Signed)
Patient returned call and stated that she has been doing good on the increased dose and PT. She also states that she has seen an improvement.

## 2022-01-04 NOTE — Telephone Encounter (Signed)
Called patient and someone picked up line and did not speak. Will call back.

## 2022-01-04 NOTE — Therapy (Signed)
OUTPATIENT PHYSICAL THERAPY TREATMENT NOTE   Patient Name: Alexandria Weber MRN: [id number] DOB:1965/08/15, 56 y.o., female Today's Date: 01/04/2022  PCP: Ma Hillock, DO REFERRING PROVIDER: Ludwig Clarks, DO  END OF SESSION:   PT End of Session - 01/04/22 1356     Visit Number 4    Number of Visits 6    Date for PT Re-Evaluation 02/09/22    Authorization Type UHC 2023    Authorization Time Period no auth, 100 VL    Authorization - Visit Number 4    Authorization - Number of Visits 100    PT Start Time 1400    PT Stop Time 9373    PT Time Calculation (min) 45 min    Activity Tolerance Patient tolerated treatment well    Behavior During Therapy WFL for tasks assessed/performed             Past Medical History:  Diagnosis Date   Anxiety    Depression    Fibroadenoma of breast    H/O IN LEFT BREAST NO CHANGE PER [place]   Gallstones 2021   Heart murmur    mild mitral stenosis and aortic stenosis by prior PCP note. Saw Dr. Mauricio Po in 2013-2015.unable to view echo or records surrounding condition.    High cholesterol    Hyperparathyroidism ([place]) 10/30/2019   Hypertension    LBBB (left bundle branch block) 2013   saw Dr. Mauricio Po - stable   Migraine headache    PMS (premenstrual syndrome)    Past Surgical History:  Procedure Laterality Date   ADENOIDECTOMY     CESAREAN SECTION     x2   Patient Active Problem List   Diagnosis Date Noted   Positive ANA (antinuclear antibody) 11/15/2021   Elevated CK 11/15/2021   Elevated LFTs 10/10/2021   Muscle weakness 10/10/2021   Rash 10/10/2021   Cervical dystonia 07/19/2021   Gallstones 07/14/2021   Elevated bilirubin 07/14/2021   Abnormal head movements 07/14/2021   Vitamin D deficiency 10/30/2019   On statin therapy 08/26/2019   Elevated alkaline phosphatase level 08/26/2019   Hyperlipidemia 01/25/2016   Torticollis, acquired 07/27/2015   Mitral valve stenosis 03/20/2014   Hypertension 04/04/2012   LBBB (left  bundle branch block) 03/01/2012   Anxiety     REFERRING DIAG: Cervical dystonia  THERAPY DIAG:  Muscle weakness (generalized)  Cervicalgia  PERTINENT HISTORY: see PMH  PRECAUTIONS:   SUBJECTIVE: Soreness in left upper trap, notices some discomfort when sitting in soft recliner. Rotation SNAG causes some discomfort  PAIN:  Are you having pain? No   OBJECTIVE:        COGNITION: Overall cognitive status: Within functional limits for tasks assessed             SENSATION: WFL   COORDINATION: WNL       MUSCLE TONE: increased tone/resistance to stretch noted left cervical column     POSTURE:  head rotation right   LE ROM:      Cervical: left rotation 25% limited                      Right rotation 10% limited                      Left sidebending 25% limited                      Right sidebending 10% limited     Bed mobility,  transfers, and gait performed independently   TODAY'S TREATMENT:   TODAY'S TREATMENT: 01/04/22 Activity Comments  HEP review: Cervical rotation/extension SNAG    Chin retractions 10x 3 sec   Supine cervical extension isometric 2x10, 5 sec hold   Prone Y's, T's 1 lbs 1x10 Use of swiss ball for prone position  Supine  deep cervical flexion 2x10  Chin 1-2" lift-off, 3 sec  Manual therapy completed separate from all other interventions: passive ROM/static stretching to cervical spine to improve ROM and flexibility, contract-relax to improve left rotation Left rotation < right rotation        PATIENT EDUCATION: Education details:HEP review and new additions Person educated: Patient Education method: Customer service manager Education comprehension: verbalized understanding     HOME EXERCISE PROGRAM: xtension SNAG, rotation SNAG, chin retraction, levator scap stretch Access Code [email or web address] Cervical extension isometric (supine) 2x10 Cervical flexion (1-2" lift) in supine 2x10 Prone scapula: T's and Y's       GOALS: Goals  reviewed with patient? Yes   SHORT TERM GOALS: Target date: 01/05/2022      Patient will be independent in HEP to improve functional outcomes Baseline: Goal status: INITIAL   2.  Pt to be independent in strategies to maintain stable head/neck when sitting at desk x 30 minutes to improve work performance Baseline:  Goal status: INITIAL       LONG TERM GOALS: Target date: 01/26/2022      Demonstrate full cervical rotation ROM to enable greater comfort/safety when driving Baseline: 16-01% limited Goal status: INITIAL       ASSESSMENT:   CLINICAL IMPRESSION: Discomfort with rotation SNAG which seems to be more cervical spine apophyseal lmitation vs soft tissue stretch. Advised to discontinue this stretch if it is causing discomfort. Left rotation limited by feeling of tension eminating from right cervical column. Good performance of scapular strengthening exercises and using swiss ball for prone position to execute. Continued sessions to progress activities and HEP refinement     OBJECTIVE IMPAIRMENTS decreased knowledge of condition, decreased ROM, increased fascial restrictions, increased muscle spasms, impaired flexibility, impaired tone, and postural dysfunction.    ACTIVITY LIMITATIONS driving and occupation.    PERSONAL FACTORS Past/current experiences, Time since onset of injury/illness/exacerbation, and 1 comorbidity: dystonia  are also affecting patient's functional outcome.      REHAB POTENTIAL: Good   CLINICAL DECISION MAKING: Evolving/moderate complexity   EVALUATION COMPLEXITY: Moderate   PLAN: PT FREQUENCY: 1x/week   PT DURATION: 6 weeks   PLANNED INTERVENTIONS: Therapeutic exercises, Therapeutic activity, Neuromuscular re-education, Balance training, Gait training, Patient/Family education, Joint manipulation, Joint mobilization, Vestibular training, Orthotic/Fit training, Dry Needling, Electrical stimulation, Spinal mobilization, Cryotherapy, Moist heat, Taping,  Traction, and Manual therapy   PLAN FOR NEXT SESSION: HEP review, manual stretching, sensory tricks   1:57 PM, 01/04/22 M. Sherlyn Lees, PT, DPT Physical Therapist- [place] Office Number: [phone]

## 2022-01-11 ENCOUNTER — Ambulatory Visit: Payer: 59 | Attending: Family Medicine

## 2022-01-11 DIAGNOSIS — M542 Cervicalgia: Secondary | ICD-10-CM | POA: Insufficient documentation

## 2022-01-11 DIAGNOSIS — M6281 Muscle weakness (generalized): Secondary | ICD-10-CM | POA: Diagnosis present

## 2022-01-11 NOTE — Therapy (Signed)
OUTPATIENT PHYSICAL THERAPY TREATMENT NOTE   Patient Name: Alexandria Weber MRN: [id number] DOB:07-Jan-1966, 56 y.o., female Today's Date: 01/11/2022  PCP: Ma Hillock, DO REFERRING PROVIDER: Ludwig Clarks, DO  END OF SESSION:   PT End of Session - 01/11/22 1449     Visit Number 5    Number of Visits 6    Date for PT Re-Evaluation 02/09/22    Authorization Type UHC 2023    Authorization Time Period no auth, 100 VL    Authorization - Visit Number 5    Authorization - Number of Visits 100    PT Start Time 0865    PT Stop Time 1530    PT Time Calculation (min) 44 min    Activity Tolerance Patient tolerated treatment well    Behavior During Therapy WFL for tasks assessed/performed             Past Medical History:  Diagnosis Date   Anxiety    Depression    Fibroadenoma of breast    H/O IN LEFT BREAST NO CHANGE PER [place]   Gallstones 2021   Heart murmur    mild mitral stenosis and aortic stenosis by prior PCP note. Saw Dr. Mauricio Po in 2013-2015.unable to view echo or records surrounding condition.    High cholesterol    Hyperparathyroidism ([place]) 10/30/2019   Hypertension    LBBB (left bundle branch block) 2013   saw Dr. Mauricio Po - stable   Migraine headache    PMS (premenstrual syndrome)    Past Surgical History:  Procedure Laterality Date   ADENOIDECTOMY     CESAREAN SECTION     x2   Patient Active Problem List   Diagnosis Date Noted   Positive ANA (antinuclear antibody) 11/15/2021   Elevated CK 11/15/2021   Elevated LFTs 10/10/2021   Muscle weakness 10/10/2021   Rash 10/10/2021   Cervical dystonia 07/19/2021   Gallstones 07/14/2021   Elevated bilirubin 07/14/2021   Abnormal head movements 07/14/2021   Vitamin D deficiency 10/30/2019   On statin therapy 08/26/2019   Elevated alkaline phosphatase level 08/26/2019   Hyperlipidemia 01/25/2016   Torticollis, acquired 07/27/2015   Mitral valve stenosis 03/20/2014   Hypertension 04/04/2012   LBBB (left  bundle branch block) 03/01/2012   Anxiety     REFERRING DIAG: Cervical dystonia  THERAPY DIAG:  Muscle weakness (generalized)  Cervicalgia  PERTINENT HISTORY: see PMH  PRECAUTIONS:   SUBJECTIVE: Notes some variability in the left side of neck being sore. Nothing of too serious problem.   PAIN:  Are you having pain? No   OBJECTIVE:        COGNITION: Overall cognitive status: Within functional limits for tasks assessed             SENSATION: WFL   COORDINATION: WNL       MUSCLE TONE: increased tone/resistance to stretch noted left cervical column     POSTURE:  head rotation right   LE ROM:      Cervical: left rotation 25% limited                      Right rotation 10% limited                      Left sidebending 25% limited                      Right sidebending 10% limited     Bed mobility, transfers,  and gait performed independently   TODAY'S TREATMENT:   TODAY'S TREATMENT: 01/11/22 Activity Comments  HEP review: Cervical rotation/extension SNAG    Chin retractions 10x 3 sec   Supine cervical extension isometric 2x10, 5 sec hold   Prone Y's, T's 2 lbs 2x10 Use of swiss ball for prone position  Forearm walks on wall with red t-loop 3x5 reps   Supine  deep cervical flexion 2x10  Chin 1-2" lift-off, 3 sec  Manual therapy completed separate from all other interventions: passive ROM/static stretching to cervical spine to improve ROM and flexibility, STM applied to left cervical column Left rotation < right rotation. Left cervical column tension/spasm present vs right        PATIENT EDUCATION: Education details:HEP review and new additions Person educated: Patient Education method: Customer service manager Education comprehension: verbalized understanding     HOME EXERCISE PROGRAM: xtension SNAG, rotation SNAG, chin retraction, levator scap stretch Access Code [email or web address] Cervical extension isometric (supine) 2x10 Cervical flexion (1-2" lift) in  supine 2x10 Prone scapula: T's and Y's over stability ball Forearm walks on wall with resistance band       GOALS: Goals reviewed with patient? Yes   SHORT TERM GOALS: Target date: 01/05/2022      Patient will be independent in HEP to improve functional outcomes Baseline: Goal status: On-going   2.  Pt to be independent in strategies to maintain stable head/neck when sitting at desk x 30 minutes to improve work performance Baseline:  Goal status: on-going       LONG TERM GOALS: Target date: 01/26/2022      Demonstrate full cervical rotation ROM to enable greater comfort/safety when driving Baseline: 77-93% limited Goal status: INITIAL       ASSESSMENT:   CLINICAL IMPRESSION: Tolerating tx sessions quite well and demonstrates great HEP recall for trapezius strengthening and cervical ROM and postural stability exercises.  Main tx focus has been on muscular re-education to train deep cervical musculature and middle/lower trap to provide for recruitment and balance against the cervical dystonia which biases to upper trap and SCM.  Pt demo understanding of sensory tricks to implement. Re-assess and D/C to HEP after review of program     OBJECTIVE IMPAIRMENTS decreased knowledge of condition, decreased ROM, increased fascial restrictions, increased muscle spasms, impaired flexibility, impaired tone, and postural dysfunction.    ACTIVITY LIMITATIONS driving and occupation.    PERSONAL FACTORS Past/current experiences, Time since onset of injury/illness/exacerbation, and 1 comorbidity: dystonia  are also affecting patient's functional outcome.      REHAB POTENTIAL: Good   CLINICAL DECISION MAKING: Evolving/moderate complexity   EVALUATION COMPLEXITY: Moderate   PLAN: PT FREQUENCY: 1x/week   PT DURATION: 6 weeks   PLANNED INTERVENTIONS: Therapeutic exercises, Therapeutic activity, Neuromuscular re-education, Balance training, Gait training, Patient/Family education, Joint  manipulation, Joint mobilization, Vestibular training, Orthotic/Fit training, Dry Needling, Electrical stimulation, Spinal mobilization, Cryotherapy, Moist heat, Taping, Traction, and Manual therapy   PLAN FOR NEXT SESSION: HEP review, D/C assessment   2:50 PM, 01/11/22 M. Sherlyn Lees, PT, DPT Physical Therapist- [place] Office Number: [phone]

## 2022-01-18 NOTE — Therapy (Signed)
OUTPATIENT PHYSICAL THERAPY DISCHARGE SUMMARY   Patient Name: Alexandria Weber MRN: [id number] DOB:15-Aug-1965, 56 y.o., female Today's Date: 01/19/2022  PCP: Ma Hillock, DO REFERRING PROVIDER: Ludwig Clarks, DO  END OF SESSION:   PT End of Session - 01/19/22 1441     Visit Number 6    Number of Visits 6    Date for PT Re-Evaluation 02/09/22    Authorization Type UHC 2023    Authorization Time Period no auth, 100 VL    Authorization - Visit Number 6    Authorization - Number of Visits 100    PT Start Time 1401    PT Stop Time 1435    PT Time Calculation (min) 34 min    Activity Tolerance Patient tolerated treatment well    Behavior During Therapy WFL for tasks assessed/performed              Past Medical History:  Diagnosis Date   Anxiety    Depression    Fibroadenoma of breast    H/O IN LEFT BREAST NO CHANGE PER [place]   Gallstones 2021   Heart murmur    mild mitral stenosis and aortic stenosis by prior PCP note. Saw Dr. Mauricio Po in 2013-2015.unable to view echo or records surrounding condition.    High cholesterol    Hyperparathyroidism ([place]) 10/30/2019   Hypertension    LBBB (left bundle branch block) 2013   saw Dr. Mauricio Po - stable   Migraine headache    PMS (premenstrual syndrome)    Past Surgical History:  Procedure Laterality Date   ADENOIDECTOMY     CESAREAN SECTION     x2   Patient Active Problem List   Diagnosis Date Noted   Positive ANA (antinuclear antibody) 11/15/2021   Elevated CK 11/15/2021   Elevated LFTs 10/10/2021   Muscle weakness 10/10/2021   Rash 10/10/2021   Cervical dystonia 07/19/2021   Gallstones 07/14/2021   Elevated bilirubin 07/14/2021   Abnormal head movements 07/14/2021   Vitamin D deficiency 10/30/2019   On statin therapy 08/26/2019   Elevated alkaline phosphatase level 08/26/2019   Hyperlipidemia 01/25/2016   Torticollis, acquired 07/27/2015   Mitral valve stenosis 03/20/2014   Hypertension 04/04/2012   LBBB  (left bundle branch block) 03/01/2012   Anxiety     REFERRING DIAG: Cervical dystonia  THERAPY DIAG:  Muscle weakness (generalized)  Cervicalgia  PERTINENT HISTORY: see PMH  PRECAUTIONS:   SUBJECTIVE: No new issues since last session. Feeling ready for transition to HEP.   PAIN:  Are you having pain? No   OBJECTIVE:        COGNITION: Overall cognitive status: Within functional limits for tasks assessed             SENSATION: WFL   COORDINATION: WNL       MUSCLE TONE: increased tone/resistance to stretch noted left cervical column     POSTURE:  head rotation right   LE ROM:      Cervical: left rotation 25% limited                      Right rotation 10% limited                      Left sidebending 25% limited                      Right sidebending 10% limited     Bed mobility, transfers, and gait performed  independently   TODAY'S TREATMENT:     TODAY'S TREATMENT: 01/19/22 Activity Comments  Cervical flexion 56  extension 44  R SB 35  L SB 53  R rotation 40  L rotation  36  Review of HEP:   extension SNAG 10x rotation SNAG 5x each  chin retraction 5x3" levator scap stretch 30" Cervical extension isometric (supine) x10 3" (with pillow and towel roll) Cervical flexion (1-2" lift) in supine 5x Prone scapula: T's and Y's over stability ball 10x 1# Forearm walks on wall with red resistance band and serratus slides 10x -Cues to adjust hand placement with rotation SNAG -provided strap assist with LS stretch -cued to demonstrate smaller amplitude lift  with cervical flexion          PATIENT EDUCATION: Education details:HEP review, answering patient's questions; printed LS since she didn't have that Person educated: Patient Education method: Customer service manager Education comprehension: verbalized understanding     HOME EXERCISE PROGRAM: extension SNAG, rotation SNAG, chin retraction, levator scap stretch Cervical extension  isometric (supine) 2x10 Cervical flexion (1-2" lift) in supine 2x10 Prone scapula: T's and Y's over stability ball Forearm walks on wall with resistance band       GOALS: Goals reviewed with patient? Yes   SHORT TERM GOALS: Target date: 01/05/2022      Patient will be independent in HEP to improve functional outcomes Baseline: Goal status: MET 01/19/22   2.  Pt to be independent in strategies to maintain stable head/neck when sitting at desk x 30 minutes to improve work performance Baseline:  Goal status: PARTIALLY MET 01/19/22; reports tolerance for 20-25 minutes of activity requiring neck flexion       LONG TERM GOALS: Target date: 01/26/2022      Demonstrate full cervical rotation ROM to enable greater comfort/safety when driving Baseline: 56-31% limited Goal status: MET 01/19/22; patient reports improvement in ability to check blind spot while driving        ASSESSMENT:   CLINICAL IMPRESSION: Patient arrived to session without new issues and r4eporting that she feels ready to wrap up at this time. Reports tolerance for activities like typing or reading for 20-25 minutes before needing a break d/t muscle fatigue. Notes improvement in ability to check blind spot while driving. Cervical AROM revealed remaining limitations in R SBing and L rotation. Reviewed HEP and provided cues and answered patient's questions. Patient has progressed well towards goals and is ready for DC at this time.      OBJECTIVE IMPAIRMENTS decreased knowledge of condition, decreased ROM, increased fascial restrictions, increased muscle spasms, impaired flexibility, impaired tone, and postural dysfunction.    ACTIVITY LIMITATIONS driving and occupation.    PERSONAL FACTORS Past/current experiences, Time since onset of injury/illness/exacerbation, and 1 comorbidity: dystonia  are also affecting patient's functional outcome.      REHAB POTENTIAL: Good   CLINICAL DECISION MAKING: Evolving/moderate  complexity   EVALUATION COMPLEXITY: Moderate   PLAN: PT FREQUENCY: 1x/week   PT DURATION: 6 weeks   PLANNED INTERVENTIONS: Therapeutic exercises, Therapeutic activity, Neuromuscular re-education, Balance training, Gait training, Patient/Family education, Joint manipulation, Joint mobilization, Vestibular training, Orthotic/Fit training, Dry Needling, Electrical stimulation, Spinal mobilization, Cryotherapy, Moist heat, Taping, Traction, and Manual therapy   PLAN FOR NEXT SESSION: DC at this time   PHYSICAL THERAPY DISCHARGE SUMMARY  Visits from Start of Care: 6  Current functional level related to goals / functional outcomes: See above clinical impression   Remaining deficits: Decreased cervical ROM in R Sbing and  L rotation   Education / Equipment: HEP  Plan: Patient agrees to discharge.  Patient goals were partially met. Patient is being discharged due to meeting the stated rehab goals.       Janene Harvey, PT, DPT 01/19/22 2:43 PM  [facility] at [facility] [address] [place], [place], [place] [address] Phone # [phone] Fax # [phone]

## 2022-01-19 ENCOUNTER — Ambulatory Visit: Payer: 59 | Admitting: Physical Therapy

## 2022-01-19 ENCOUNTER — Encounter: Payer: Self-pay | Admitting: Physical Therapy

## 2022-01-19 DIAGNOSIS — M6281 Muscle weakness (generalized): Secondary | ICD-10-CM

## 2022-01-19 DIAGNOSIS — M542 Cervicalgia: Secondary | ICD-10-CM

## 2022-01-20 NOTE — Progress Notes (Unsigned)
   Assessment/Plan:   1.  Cervical dystonia  -The primary muscles involved are the L SCM, L levator scapulae (small player), L trap (shoulder elevator), R splenius capitus.      Subjective:   Alexandria Weber was seen today in follow up for cervical dystonia.  My previous records as well as any outside records available were reviewed prior to todays visit.  Last injections were April 21.  Dosage was increased to 260 units.  Patient feels that this definitely helped more than the lower dosage.  CURRENT MEDICATIONS:  Outpatient Encounter Medications as of 01/24/2022  Medication Sig   amLODipine (NORVASC) 2.5 MG tablet Take 1 tablet (2.5 mg total) by mouth daily.   BOTOX 100 units SOLR injection INJECT 300 UNITS INTO THE HEAD AND NECK AREA EVERY 90 DAYS FOR CERVICAL DYSTONIA.   Cholecalciferol (DIALYVITE VITAMIN D 5000) 125 MCG (5000 UT) capsule Take 1 capsule (5,000 Units total) by mouth daily.   latanoprost (XALATAN) 0.005 % ophthalmic solution Apply to eye.   propranolol (INDERAL) 20 MG tablet Take 1 tablet (20 mg total) by mouth 2 (two) times daily as needed.   triamcinolone cream (KENALOG) 0.1 % Apply topically 2 (two) times daily.   venlafaxine XR (EFFEXOR-XR) 150 MG 24 hr capsule Take 1 capsule (150 mg total) by mouth daily with breakfast.   No facility-administered encounter medications on file as of 01/24/2022.     Objective:   PHYSICAL EXAMINATION:    VITALS:   There were no vitals filed for this visit.   GEN:  The patient appears stated age and is in NAD. HEENT:  Normocephalic, atraumatic.  The mucous membranes are moist.   Neurological examination:  Orientation: The patient is alert and oriented x3. Cranial nerves: There is good facial symmetry.The speech is fluent and clear. Soft palate rises symmetrically and there is no tongue deviation. Hearing is intact to conversational tone. Motor: Strength is at least antigravity x4.  Movement examination: Tone: Tone is  good throughout. Sensation: Sensation is intact to light touch  Motor: Strength is antigravity x 4 (at least) Gait and Station: The patient is able to ambulate without difficulty.  Abnormal movements: Patient's head is turned to the right with hypertrophy and contraction of the left sternocleidomastoid but better than prior.  There is slight shoulder elevation on the left.  There is an irregular/jerky head tremor.  head pull to the R is less intense than prior    Cc:  Kuneff, Renee A, DO

## 2022-01-23 ENCOUNTER — Other Ambulatory Visit: Payer: Self-pay

## 2022-01-24 ENCOUNTER — Encounter: Payer: Self-pay | Admitting: Neurology

## 2022-01-24 ENCOUNTER — Ambulatory Visit (INDEPENDENT_AMBULATORY_CARE_PROVIDER_SITE_OTHER): Payer: 59 | Admitting: Neurology

## 2022-01-24 VITALS — BP 132/88 | HR 63 | Ht 65.0 in | Wt 151.4 lb

## 2022-01-24 DIAGNOSIS — G243 Spasmodic torticollis: Secondary | ICD-10-CM | POA: Diagnosis not present

## 2022-01-25 ENCOUNTER — Ambulatory Visit: Payer: 59

## 2022-02-02 ENCOUNTER — Telehealth: Payer: Self-pay | Admitting: Neurology

## 2022-02-02 ENCOUNTER — Other Ambulatory Visit: Payer: Self-pay

## 2022-02-02 MED ORDER — BOTOX 100 UNITS IJ SOLR: INTRAMUSCULAR | 1 refills | Status: DC

## 2022-02-02 NOTE — Telephone Encounter (Signed)
Angie from [facility] called in and left a message with the access nurse. She needs some information concerning medication that needs to be billed.

## 2022-02-02 NOTE — Telephone Encounter (Signed)
Spoke to [facility] and sent message to the PA team for help

## 2022-02-02 NOTE — Telephone Encounter (Signed)
Called [facility] waiting on return call

## 2022-02-02 NOTE — Telephone Encounter (Signed)
Patient was previously filling through [facility], and [facility] rep advised that they are no longer in network.  Called [facility] and initiated a new authorization to update the pharmacy to [employer].  Received notification from [facility] regarding a prior authorization for  Botox . Authorization has been APPROVED from 02/02/22 to 02/03/23.   Approved for up to 4 vials every 12 weeks (4 visits)  Auth# [id number]  Phone# [phone]  Has a new rx been sent to [facility]? In chart, last rx went to [facility].

## 2022-02-06 NOTE — Telephone Encounter (Signed)
Hey just called [facility] and they are saying that the PA is not valid for medical and we need to re run it as Pharmacy. Do you mind helping me with this

## 2022-02-08 ENCOUNTER — Telehealth (HOSPITAL_COMMUNITY): Payer: Self-pay | Admitting: Pharmacy Technician

## 2022-02-08 NOTE — Telephone Encounter (Signed)
Patient Advocate Encounter   Received notification that prior authorization for Botox 100UNIT solution is required.   PA submitted on 02/08/2022 Key [id number] Status is pending       Lyndel Safe, [place] Patient Advocate Specialist [place] Patient Advocate Team Direct Number: [phone]  Fax: [phone]

## 2022-02-08 NOTE — Telephone Encounter (Signed)
Patient Advocate Encounter  Received notification that the request for prior authorization for Botox 100UNIT solution has been denied due to The preferred products for the patient's health plan are Dysport, Myobloc and Xeomin. Current plan approved criteria does not allow coverage of the requested medication for cervical dystonia unless the patient has tried all of the preferred products and they didn't work well or the patient had a bad side effect. Lyndel Safe, [place] Patient Advocate Specialist [place] Patient Advocate Team Direct Number: [phone]  Fax: [phone]

## 2022-02-09 ENCOUNTER — Telehealth: Payer: Self-pay

## 2022-02-09 NOTE — Telephone Encounter (Signed)
Called patient to discuss her Botox denial and what the next steps are

## 2022-02-14 DIAGNOSIS — H40023 Open angle with borderline findings, high risk, bilateral: Secondary | ICD-10-CM | POA: Insufficient documentation

## 2022-02-16 NOTE — Telephone Encounter (Signed)
Called patient and she agreed to trying Xeomin and that I am going to try to get it by her appointment on the 21st but worst case we may have to move her appointment back slightly if I can not get it in the door by Friday th 21st. She completely agreed and understand

## 2022-02-17 ENCOUNTER — Telehealth (HOSPITAL_COMMUNITY): Payer: Self-pay | Admitting: Pharmacy Technician

## 2022-02-17 ENCOUNTER — Other Ambulatory Visit (HOSPITAL_COMMUNITY): Payer: Self-pay

## 2022-02-17 NOTE — Telephone Encounter (Signed)
Patient Advocate Encounter   Received notification that prior authorization for Xeomin 100UNIT solution is required.   PA submitted on 02/17/2022 Key [id number] Status is pending       Lyndel Safe, [place] Patient Advocate Specialist [place] Patient Advocate Team Direct Number: [phone]  Fax: [phone]

## 2022-02-20 ENCOUNTER — Other Ambulatory Visit (HOSPITAL_COMMUNITY): Payer: Self-pay

## 2022-02-20 NOTE — Telephone Encounter (Signed)
Patient Advocate Encounter  Prior Authorization for Xeomin 100UNIT solution has been approved.    PA# [id number] Effective dates: 02/17/2022 through 02/18/2023  Can be filled at [place], [place] Patient [place] Patient Advocate Team Direct Number: [phone]  Fax: [phone]

## 2022-02-20 NOTE — Telephone Encounter (Signed)
Patient Advocate Encounter   Prior Authorization for Xeomin 100UNIT solution has been approved.     PA# [id number] Effective dates: 02/17/2022 through 02/18/2023   Can be filled at [place], [place] Patient [place] Patient Advocate Team Direct Number: [phone]  Fax: [phone]

## 2022-02-21 ENCOUNTER — Other Ambulatory Visit (HOSPITAL_COMMUNITY): Payer: Self-pay

## 2022-02-21 ENCOUNTER — Other Ambulatory Visit: Payer: Self-pay

## 2022-02-21 DIAGNOSIS — G243 Spasmodic torticollis: Secondary | ICD-10-CM

## 2022-02-21 MED ORDER — XEOMIN 100 UNITS IM SOLR
INTRAMUSCULAR | 1 refills | Status: DC
  Filled 2022-02-21: qty 3, 90d supply, fill #0
  Filled 2022-02-21: qty 3, fill #0
  Filled 2022-02-23: qty 3, 30d supply, fill #0
  Filled 2022-05-08: qty 3, 30d supply, fill #1

## 2022-02-21 NOTE — Telephone Encounter (Signed)
Prescription sent to [facility] and called and spoke to one of the pharmacy Techs to help get patients meds in the office before the appointment on Friday

## 2022-02-22 ENCOUNTER — Other Ambulatory Visit: Payer: Self-pay | Admitting: Family Medicine

## 2022-02-22 DIAGNOSIS — Z1231 Encounter for screening mammogram for malignant neoplasm of breast: Secondary | ICD-10-CM

## 2022-02-23 ENCOUNTER — Other Ambulatory Visit (HOSPITAL_COMMUNITY): Payer: Self-pay

## 2022-02-24 ENCOUNTER — Other Ambulatory Visit (HOSPITAL_COMMUNITY): Payer: Self-pay

## 2022-02-24 ENCOUNTER — Ambulatory Visit (INDEPENDENT_AMBULATORY_CARE_PROVIDER_SITE_OTHER): Payer: 59 | Admitting: Neurology

## 2022-02-24 DIAGNOSIS — G243 Spasmodic torticollis: Secondary | ICD-10-CM

## 2022-02-24 MED ORDER — INCOBOTULINUMTOXINA 100 UNITS IM SOLR
300.0000 [IU] | INTRAMUSCULAR | Status: DC
  Administered 2022-02-24: 260 [IU] via INTRAMUSCULAR

## 2022-02-24 NOTE — Procedures (Signed)
[facility]   Procedure Note Botox  Attending: Dr. Wells Guiles Osamah Schmader  Preoperative Diagnosis(es): Cervical Dystonia  Result History  Did well but still with sx's and the PT helped last visit as well.  Changed to xeomin this visit due to insurance request  Consent obtained from: The patient Benefits discussed included, but were not limited to decreased muscle tightness, increased joint range of motion, and decreased pain.  Risk discussed included, but were not limited pain and discomfort, bleeding, bruising, excessive weakness, venous thrombosis, muscle atrophy and dysphagia.  A copy of the patient medication guide was given to the patient which explains the blackbox warning.  Patients identity and treatment sites confirmed Yes.  .  Details of Procedure: Skin was cleaned with alcohol.  A 30 gauge, 31m  needle was introduced to the target muscle, except for posterior splenius where 27 gauge, 1.5 inch needle used.   Prior to injection, the needle plunger was aspirated to make sure the needle was not within a blood vessel.  There was no blood retrieved on aspiration.    Following is a summary of the muscles injected  And the amount of Botulinum toxin used:   Dilution 0.9% preservative free saline mixed with 100 u Botox type A to make 10 U per 0.1cc  Injections  Location Left  Right Units Number of sites        Sternocleidomastoid 60  60 1  Splenius Capitus, posterior approach  100 100 1  Splenius Capitus, lateral approach  50 50 1  Levator Scapulae      Trapezius 20/20/10  50 3        TOTAL UNITS:   260     Agent: Botulinum Toxin type A (Xeomin).    3vials of Botox were used, each containing 100 units and freshly diluted with 1 mL of sterile, non-preserved saline   Total injected (Units): 260  Total wasted (Units):40   Pt tolerated procedure well without complications.   Reinjection is anticipated in 3 months.

## 2022-02-27 ENCOUNTER — Other Ambulatory Visit (HOSPITAL_COMMUNITY): Payer: Self-pay

## 2022-03-08 ENCOUNTER — Ambulatory Visit
Admission: RE | Admit: 2022-03-08 | Discharge: 2022-03-08 | Disposition: A | Discharge status: Home / Self Care | Payer: 59 | Source: Ambulatory Visit | Attending: Family Medicine | Admitting: Family Medicine

## 2022-03-08 DIAGNOSIS — Z1231 Encounter for screening mammogram for malignant neoplasm of breast: Secondary | ICD-10-CM

## 2022-03-09 ENCOUNTER — Other Ambulatory Visit (HOSPITAL_COMMUNITY): Payer: Self-pay

## 2022-03-23 NOTE — Progress Notes (Signed)
Office Visit Note  Patient: Alexandria Weber             Date of Birth: 10/18/1965           MRN: [id number]             PCP: Ma Hillock, DO Referring: Ma Hillock, DO Visit Date: 03/28/2022   Subjective:  Follow-up (Feeling pretty good. No recurrence of rash or redness under nails.)   History of Present Illness: Alexandria Weber is a 56 y.o. female here for follow up for muscle weakness and rashes.  After our last visit she was applying topical triamcinolone to the rash and erythema on her fingers this took at least a few weeks to clear up but did eventually get completely better.  Symptoms have stayed resolved she is no longer using the topical triamcinolone.  She has had improvement for her torticollis with resuming the Botox injections with no problems.  She has not experienced recurrence of skin rashes on her hip or elsewhere on the body.  She is not seeing any rash or sensitivity with sun exposure.  She denies any muscle weakness problems.  Previous HPI 12/01/2021 Alexandria Weber is a 56 y.o. female here for follow up for muscle weakness and rashes. Labs at initial visit with improved CK and myositis specific antibodies were negative. Since our visit she had biopsy of the skin rash on her hip.  Pathology findings consistent with possible drug reaction but with interface dermatitis features cannot exclude autoimmune process such as lupus or dermatomyositis.  She continues to have soreness raising her arms overhead but otherwise feels strength is okay. She had next botox injection for torticollis. Skin rash remains the same as before. She was prescribed topical triamcinolone for her finger redness.   Previous HPI 11/15/21 Alexandria Weber is a 56 y.o. female here for evaluation of skin rashes and muscle weakness concerned for dermatomyositis. Initial evaluation at the ED beginning of last month after spontaneous rectus sheath hematoma jerking awake in bed with right sided  abdominal pain and bruising. She subsequently reported non-debilitating weakness involving proximal arms and legs and labs showed moderate CK elevation and LFTs elevation. She started a prednisone taper in following 2 weeks. She stopped taking atorvastatin medication after this finding. Symptoms improved partially with these treatments but muscle strength still does not feel normal and rash on the hip remains.   She has a history of some cervical dysplasia but not malignancies. She denies any oral ulcers, lymphadenopathy, raynaud's symptoms, or history of blood clots. She start botox injection for cervical dystonia in February at [facility] which was helpful and scheduled to repeat this but awaiting clearance about it affecting these new symptoms.  She is also scheduled to see [facility] next week for a skin check she never saw them before with no prior history of skin cancer or problems.   Labs reviewed ANA 1:320 speckled 1:320 homogenous dsDNA, Scl-70, SM, RNP, SSA, SSB neg CK 693 ESR 24 TSH 2.41 Alkphos 140 AST 87 ALT 62 RMSF neg Lyme neg     Review of Systems  Constitutional:  Negative for fatigue.  HENT:  Negative for mouth sores and mouth dryness.   Eyes:  Negative for dryness.  Respiratory:  Negative for shortness of breath.   Cardiovascular:  Negative for chest pain and palpitations.  Gastrointestinal:  Negative for blood in stool, constipation and diarrhea.  Endocrine: Negative for increased urination.  Genitourinary:  Negative for involuntary urination.  Musculoskeletal:  Positive for joint pain and joint pain. Negative for gait problem, joint swelling, myalgias, muscle weakness, morning stiffness, muscle tenderness and myalgias.  Skin:  Negative for color change, rash, hair loss and sensitivity to sunlight.  Allergic/Immunologic: Negative for susceptible to infections.  Neurological:  Negative for dizziness and headaches.  Hematological:  Negative for  swollen glands.  Psychiatric/Behavioral:  Negative for depressed mood and sleep disturbance. The patient is not nervous/anxious.     PMFS History:  Patient Active Problem List   Diagnosis Date Noted   Positive ANA (antinuclear antibody) 11/15/2021   Elevated CK 11/15/2021   Elevated LFTs 10/10/2021   Muscle weakness 10/10/2021   Rash 10/10/2021   Cervical dystonia 07/19/2021   Gallstones 07/14/2021   Elevated bilirubin 07/14/2021   Abnormal head movements 07/14/2021   Vitamin D deficiency 10/30/2019   On statin therapy 08/26/2019   Elevated alkaline phosphatase level 08/26/2019   Hyperlipidemia 01/25/2016   Torticollis, acquired 07/27/2015   Mitral valve stenosis 03/20/2014   Hypertension 04/04/2012   LBBB (left bundle branch block) 03/01/2012   Anxiety     Past Medical History:  Diagnosis Date   Anxiety    Depression    Fibroadenoma of breast    H/O IN LEFT BREAST NO CHANGE PER [place]   Gallstones 2021   Heart murmur    mild mitral stenosis and aortic stenosis by prior PCP note. Saw Dr. Mauricio Po in 2013-2015.unable to view echo or records surrounding condition.    High cholesterol    Hyperparathyroidism ([place]) 10/30/2019   Hypertension    LBBB (left bundle branch block) 2013   saw Dr. Mauricio Po - stable   Migraine headache    PMS (premenstrual syndrome)     Family History  Problem Relation Age of Onset   Heart disease Mother    Hearing loss Mother    Heart disease Father    Hypertension Father    Stroke Father    Arthritis Father    Drug abuse Brother        Commited suicide   Hypertension Maternal Grandfather    Hypertension Paternal Grandfather    Heart disease Paternal Grandfather    Diabetes Mellitus II Maternal Aunt    Diabetes Mellitus II Paternal Aunt    Heart disease Paternal Uncle    Colon cancer Neg Hx    Breast cancer Neg Hx    Hyperparathyroidism Neg Hx    Past Surgical History:  Procedure Laterality Date   ADENOIDECTOMY     CESAREAN SECTION      x2   Social History   Social History Narrative   Married. Spouse is name Suezanne Jacquet. 3 children.   High school graduate. Full-time employed. 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   Drinks caffeinated beverages, no tobacco, no alcohol, no recreational drugs.   Wears her seatbelt. Smoke detectors located in the home. Firearms located in the home in a locked case.   Feels safe in her relationship.      Right Handed    Lives in a one story home   Immunization History  Administered Date(s) Administered   Influenza Inj Mdck Quad Pf 08/26/2018   Influenza, Quadrivalent, Recombinant, Inj, Pf 05/18/2019   Influenza,inj,Quad PF,6+ Mos 07/27/2015, 09/05/2016, 06/28/2020, 06/02/2021   Influenza-Unspecified 06/17/2013, 06/17/2014, 06/05/2017   PFIZER(Purple Top)SARS-COV-2 Vaccination 10/27/2019, 11/24/2019, 08/20/2020   Pfizer Covid-19 Vaccine Bivalent Booster 20yr & up 05/31/2021   Tdap 01/25/2016   Zoster Recombinat (Shingrix) 06/12/2018, 10/10/2018     Objective: Vital  Signs: BP 133/83 (BP Location: Left Arm, Patient Position: Sitting, Cuff Size: Normal)   Pulse 82   Resp 14   Ht 5' 5"  (1.651 m)   Wt 151 lb 9.6 oz (68.8 kg)   LMP 09/07/2017   BMI 25.23 kg/m    Physical Exam Cardiovascular:     Rate and Rhythm: Normal rate and regular rhythm.  Pulmonary:     Effort: Pulmonary effort is normal.     Breath sounds: Normal breath sounds.  Musculoskeletal:     Right lower leg: No edema.     Left lower leg: No edema.  Skin:    General: Skin is warm and dry.     Findings: No rash.  Neurological:     Mental Status: She is alert.     Motor: No weakness.     Gait: Gait normal.      Musculoskeletal Exam:  Neck restricted rotation with increased muscle tone of SCM Shoulders full ROM no tenderness or swelling Elbows full ROM no tenderness or swelling Wrists full ROM no tenderness or swelling Fingers full ROM no tenderness or swelling Knees full ROM no tenderness or  swelling  Investigation: No additional findings.  Imaging: MM 3D SCREEN BREAST BILATERAL  Result Date: 03/09/2022 CLINICAL DATA:  Screening. EXAM: DIGITAL SCREENING BILATERAL MAMMOGRAM WITH TOMOSYNTHESIS AND CAD TECHNIQUE: Bilateral screening digital craniocaudal and mediolateral oblique mammograms were obtained. Bilateral screening digital breast tomosynthesis was performed. The images were evaluated with computer-aided detection. COMPARISON:  Previous exam(s). ACR Breast Density Category b: There are scattered areas of fibroglandular density. FINDINGS: There are no findings suspicious for malignancy. IMPRESSION: No mammographic evidence of malignancy. A result letter of this screening mammogram will be mailed directly to the patient. RECOMMENDATION: Screening mammogram in one year. (Code:SM-B-01Y) BI-RADS CATEGORY  1: Negative. Electronically Signed   By: Valentino Saxon M.D.   On: 03/09/2022 16:49    Recent Labs: Lab Results  Component Value Date   WBC WILL FOLLOW 10/10/2021   HGB WILL FOLLOW 10/10/2021   PLT WILL FOLLOW 10/10/2021   NA 140 10/10/2021   K 3.6 10/10/2021   CL 99 10/10/2021   CO2 31 10/10/2021   GLUCOSE 102 (H) 10/10/2021   BUN 14 10/10/2021   CREATININE 0.63 10/10/2021   BILITOT 1.0 10/10/2021   ALKPHOS 140 (H) 10/10/2021   AST 87 (H) 10/10/2021   ALT 62 (H) 10/10/2021   PROT 6.8 10/10/2021   ALBUMIN 4.5 10/10/2021   CALCIUM 9.0 10/10/2021   GFRAA >89 09/05/2016    Speciality Comments: No specialty comments available.  Procedures:  No procedures performed Allergies: Penicillins, Sulfa antibiotics, and Zoloft [sertraline hcl]   Assessment / Plan:     Visit Diagnoses: Positive ANA (antinuclear antibody)  Moderate antibody titer but with no disease specific results and now her symptoms remaining improved without any kind of maintenance treatment.  May have represented a self-limited inflammatory response or reaction.  Exam today is now completely benign.  I  recommend we do not need any scheduled routine follow-up we could take another look in the future if she is developing new concerning symptoms but I think less likely.  Rash  Skin rashes are improved she is also had resolution of inflammatory appearing changes at the fingertips and is no longer using topical steroids.  Muscle weakness  No longer complaining of any muscle weakness and normal strength on examination.  Orders: No orders of the defined types were placed in this encounter.  No orders of  the defined types were placed in this encounter.    Follow-Up Instructions: No follow-ups on file.   Collier Salina, MD  Note - This record has been created using [employer].  Chart creation errors have been sought, but may not always  have been located. Such creation errors do not reflect on  the standard of medical care.

## 2022-03-28 ENCOUNTER — Ambulatory Visit: Payer: 59 | Attending: Internal Medicine | Admitting: Internal Medicine

## 2022-03-28 ENCOUNTER — Encounter: Payer: Self-pay | Admitting: Internal Medicine

## 2022-03-28 VITALS — BP 133/83 | HR 82 | Resp 14 | Ht 65.0 in | Wt 151.6 lb

## 2022-03-28 DIAGNOSIS — R768 Other specified abnormal immunological findings in serum: Secondary | ICD-10-CM | POA: Diagnosis not present

## 2022-03-28 DIAGNOSIS — M6281 Muscle weakness (generalized): Secondary | ICD-10-CM

## 2022-03-28 DIAGNOSIS — R21 Rash and other nonspecific skin eruption: Secondary | ICD-10-CM | POA: Diagnosis not present

## 2022-03-29 ENCOUNTER — Telehealth: Payer: Self-pay | Admitting: Neurology

## 2022-03-29 NOTE — Telephone Encounter (Signed)
-----   Message from [place], DO sent at 02/23/2022  4:51 PM EDT ----- Call patient and find out how she did with change to Xeomin (required by her insurance company) last time.

## 2022-03-29 NOTE — Telephone Encounter (Signed)
Called patient and a lady picked up and said "hello" and then hung up once I asked for [place]. I called back and call was forwarded to voicemail. Left a message for a call back.

## 2022-03-30 NOTE — Telephone Encounter (Signed)
Called patient and asked how she is doing on the Xeomin. Patient stated that she is doing just fine and feels that the Xeomin has been working. Patient had no complaints.

## 2022-04-25 ENCOUNTER — Other Ambulatory Visit (HOSPITAL_COMMUNITY): Payer: Self-pay

## 2022-04-25 NOTE — Telephone Encounter (Signed)
Patient Advocate Encounter  Test billing confirms PA is approved and expiring on 02/18/2023.  Clista Bernhardt, CPhT Rx Patient Advocate Phone: [phone]

## 2022-05-08 ENCOUNTER — Other Ambulatory Visit (HOSPITAL_COMMUNITY): Payer: Self-pay

## 2022-05-29 ENCOUNTER — Other Ambulatory Visit (HOSPITAL_COMMUNITY): Payer: Self-pay

## 2022-05-30 ENCOUNTER — Ambulatory Visit (INDEPENDENT_AMBULATORY_CARE_PROVIDER_SITE_OTHER): Payer: 59 | Admitting: Family Medicine

## 2022-05-30 ENCOUNTER — Encounter: Payer: Self-pay | Admitting: Family Medicine

## 2022-05-30 VITALS — BP 135/88 | HR 75 | Temp 98.0°F | Ht 65.35 in | Wt 150.8 lb

## 2022-05-30 DIAGNOSIS — Z0000 Encounter for general adult medical examination without abnormal findings: Secondary | ICD-10-CM

## 2022-05-30 DIAGNOSIS — Z79899 Other long term (current) drug therapy: Secondary | ICD-10-CM | POA: Diagnosis not present

## 2022-05-30 DIAGNOSIS — Z23 Encounter for immunization: Secondary | ICD-10-CM | POA: Diagnosis not present

## 2022-05-30 DIAGNOSIS — Z1211 Encounter for screening for malignant neoplasm of colon: Secondary | ICD-10-CM | POA: Diagnosis not present

## 2022-05-30 DIAGNOSIS — M436 Torticollis: Secondary | ICD-10-CM

## 2022-05-30 DIAGNOSIS — E559 Vitamin D deficiency, unspecified: Secondary | ICD-10-CM | POA: Diagnosis not present

## 2022-05-30 DIAGNOSIS — F419 Anxiety disorder, unspecified: Secondary | ICD-10-CM

## 2022-05-30 DIAGNOSIS — E782 Mixed hyperlipidemia: Secondary | ICD-10-CM

## 2022-05-30 DIAGNOSIS — I10 Essential (primary) hypertension: Secondary | ICD-10-CM | POA: Diagnosis not present

## 2022-05-30 LAB — CBC WITH DIFFERENTIAL/PLATELET
Basophils Absolute: 0.1 10*3/uL (ref 0.0–0.1)
Basophils Relative: 2.4 % (ref 0.0–3.0)
Eosinophils Absolute: 0.2 10*3/uL (ref 0.0–0.7)
Eosinophils Relative: 4.4 % (ref 0.0–5.0)
HCT: 44.3 % (ref 36.0–46.0)
Hemoglobin: 14.9 g/dL (ref 12.0–15.0)
Lymphocytes Relative: 28.1 % (ref 12.0–46.0)
Lymphs Abs: 1 10*3/uL (ref 0.7–4.0)
MCHC: 33.7 g/dL (ref 30.0–36.0)
MCV: 90 fl (ref 78.0–100.0)
Monocytes Absolute: 0.3 10*3/uL (ref 0.1–1.0)
Monocytes Relative: 8 % (ref 3.0–12.0)
Neutro Abs: 2.1 10*3/uL (ref 1.4–7.7)
Neutrophils Relative %: 57.1 % (ref 43.0–77.0)
Platelets: 311 10*3/uL (ref 150.0–400.0)
RBC: 4.92 Mil/uL (ref 3.87–5.11)
RDW: 12.6 % (ref 11.5–15.5)
WBC: 3.7 10*3/uL — ABNORMAL LOW (ref 4.0–10.5)

## 2022-05-30 LAB — COMPREHENSIVE METABOLIC PANEL
ALT: 15 U/L (ref 0–35)
AST: 23 U/L (ref 0–37)
Albumin: 4.8 g/dL (ref 3.5–5.2)
Alkaline Phosphatase: 122 U/L — ABNORMAL HIGH (ref 39–117)
BUN: 14 mg/dL (ref 6–23)
CO2: 35 mEq/L — ABNORMAL HIGH (ref 19–32)
Calcium: 9.6 mg/dL (ref 8.4–10.5)
Chloride: 98 mEq/L (ref 96–112)
Creatinine, Ser: 0.76 mg/dL (ref 0.40–1.20)
GFR: 88.9 mL/min (ref >60.00)
Glucose, Bld: 88 mg/dL (ref 70–99)
Potassium: 4.1 mEq/L (ref 3.5–5.1)
Sodium: 139 mEq/L (ref 135–145)
Total Bilirubin: 1 mg/dL (ref 0.2–1.2)
Total Protein: 7.2 g/dL (ref 6.0–8.3)

## 2022-05-30 LAB — HEMOGLOBIN A1C: Hgb A1c MFr Bld: 5.5 % (ref 4.6–6.5)

## 2022-05-30 LAB — LIPID PANEL
Cholesterol: 213 mg/dL — ABNORMAL HIGH (ref 0–200)
HDL: 64.1 mg/dL (ref >39.00)
LDL Cholesterol: 118 mg/dL — ABNORMAL HIGH (ref 0–99)
NonHDL: 149.08
Total CHOL/HDL Ratio: 3
Triglycerides: 155 mg/dL — ABNORMAL HIGH (ref 0.0–149.0)
VLDL: 31 mg/dL (ref 0.0–40.0)

## 2022-05-30 LAB — VITAMIN D 25 HYDROXY (VIT D DEFICIENCY, FRACTURES): VITD: 38.54 ng/mL (ref 30.00–100.00)

## 2022-05-30 LAB — TSH: TSH: 1.52 u[IU]/mL (ref 0.35–5.50)

## 2022-05-30 MED ORDER — AMLODIPINE BESYLATE 2.5 MG PO TABS: 2.5000 mg | ORAL_TABLET | Freq: Every day | ORAL | 1 refills | Status: DC

## 2022-05-30 MED ORDER — VENLAFAXINE HCL ER 150 MG PO CP24: 150.0000 mg | ORAL_CAPSULE | Freq: Every day | ORAL | 1 refills | Status: DC

## 2022-05-30 NOTE — Patient Instructions (Signed)
No follow-ups on file.        Great to see you today.  I have refilled the medication(s) we provide.   If labs were collected, we will inform you of lab results once received either by echart message or telephone call.   - echart message- for normal results that have been seen by the patient already.   - telephone call: abnormal results or if patient has not viewed results in their echart.  Health Maintenance, Female Adopting a healthy lifestyle and getting preventive care are important in promoting health and wellness. Ask your health care provider about: The right schedule for you to have regular tests and exams. Things you can do on your own to prevent diseases and keep yourself healthy. What should I know about diet, weight, and exercise? Eat a healthy diet  Eat a diet that includes plenty of vegetables, fruits, low-fat dairy products, and lean protein. Do not eat a lot of foods that are high in solid fats, added sugars, or sodium. Maintain a healthy weight Body mass index (BMI) is used to identify weight problems. It estimates body fat based on height and weight. Your health care provider can help determine your BMI and help you achieve or maintain a healthy weight. Get regular exercise Get regular exercise. This is one of the most important things you can do for your health. Most adults should: Exercise for at least 150 minutes each week. The exercise should increase your heart rate and make you sweat (moderate-intensity exercise). Do strengthening exercises at least twice a week. This is in addition to the moderate-intensity exercise. Spend less time sitting. Even light physical activity can be beneficial. Watch cholesterol and blood lipids Have your blood tested for lipids and cholesterol at 54 years of age, then have this test every 5 years. Have your cholesterol levels checked more often if: Your lipid or cholesterol levels are high. You are older than 54 years of  age. You are at high risk for heart disease. What should I know about cancer screening? Depending on your health history and family history, you may need to have cancer screening at various ages. This may include screening for: Breast cancer. Cervical cancer. Colorectal cancer. Skin cancer. Lung cancer. What should I know about heart disease, diabetes, and high blood pressure? Blood pressure and heart disease High blood pressure causes heart disease and increases the risk of stroke. This is more likely to develop in people who have high blood pressure readings or are overweight. Have your blood pressure checked: Every 3-5 years if you are 18-39 years of age. Every year if you are 40 years old or older. Diabetes Have regular diabetes screenings. This checks your fasting blood sugar level. Have the screening done: Once every three years after age 40 if you are at a normal weight and have a low risk for diabetes. More often and at a younger age if you are overweight or have a high risk for diabetes. What should I know about preventing infection? Hepatitis B If you have a higher risk for hepatitis B, you should be screened for this virus. Talk with your health care provider to find out if you are at risk for hepatitis B infection. Hepatitis C Testing is recommended for: Everyone born from 1945 through 1965. Anyone with known risk factors for hepatitis C. Sexually transmitted infections (STIs) Get screened for STIs, including gonorrhea and chlamydia, if: You are sexually active and are younger than 54 years of age. You are   older than 54 years of age and your health care provider tells you that you are at risk for this type of infection. Your sexual activity has changed since you were last screened, and you are at increased risk for chlamydia or gonorrhea. Ask your health care provider if you are at risk. Ask your health care provider about whether you are at high risk for HIV. Your health  care provider may recommend a prescription medicine to help prevent HIV infection. If you choose to take medicine to prevent HIV, you should first get tested for HIV. You should then be tested every 3 months for as long as you are taking the medicine. Pregnancy If you are about to stop having your period (premenopausal) and you may become pregnant, seek counseling before you get pregnant. Take 400 to 800 micrograms (mcg) of folic acid every day if you become pregnant. Ask for birth control (contraception) if you want to prevent pregnancy. Osteoporosis and menopause Osteoporosis is a disease in which the bones lose minerals and strength with aging. This can result in bone fractures. If you are 65 years old or older, or if you are at risk for osteoporosis and fractures, ask your health care provider if you should: Be screened for bone loss. Take a calcium or vitamin D supplement to lower your risk of fractures. Be given hormone replacement therapy (HRT) to treat symptoms of menopause. Follow these instructions at home: Alcohol use Do not drink alcohol if: Your health care provider tells you not to drink. You are pregnant, may be pregnant, or are planning to become pregnant. If you drink alcohol: Limit how much you have to: 0-1 drink a day. Know how much alcohol is in your drink. In the [place] one drink equals one 12 oz bottle of beer (355 mL), one 5 oz glass of wine (148 mL), or one 1 oz glass of hard liquor (44 mL). Lifestyle Do not use any products that contain nicotine or tobacco. These products include cigarettes, chewing tobacco, and vaping devices, such as e-cigarettes. If you need help quitting, ask your health care provider. Do not use street drugs. Do not share needles. Ask your health care provider for help if you need support or information about quitting drugs. General instructions Schedule regular health, dental, and eye exams. Stay current with your vaccines. Tell your health  care provider if: You often feel depressed. You have ever been abused or do not feel safe at home. Summary Adopting a healthy lifestyle and getting preventive care are important in promoting health and wellness. Follow your health care provider's instructions about healthy diet, exercising, and getting tested or screened for diseases. Follow your health care provider's instructions on monitoring your cholesterol and blood pressure. This information is not intended to replace advice given to you by your health care provider. Make sure you discuss any questions you have with your health care provider. Document Revised: 12/13/2020 Document Reviewed: 12/13/2020 Elsevier Patient Education  2023 [employer]  

## 2022-05-30 NOTE — Progress Notes (Signed)
Patient ID: Alexandria Weber, female  DOB: [birth year], 56 y.o.   MRN: [id number] Patient Care Team    Relationship Specialty Notifications Start End  Ma Hillock, DO PCP - General Family Medicine  07/27/15   [facility] Ob/Gyn    07/13/21   Tat, Eustace Quail, [place] Physician Neurology  07/19/21     Chief Complaint  Patient presents with   Annual Exam    Pt is fasting. Cologuard due dec 2023    Subjective:  Alexandria Weber is a 56 y.o.  Female  present for [facility] All past medical history, surgical history, allergies, family history, immunizations, medications and social history were updated in the electronic medical record today. All recent labs, ED visits and hospitalizations within the last year were reviewed.  Health maintenance:  Colonoscopy: cologuard completed 07/2019- rpt 07/2022 - ordered Mammogram: completed:12/2020, birads 1. No fhx.. Breast center [place]>>ordered Cervical cancer screening: last pap: 09/2016, results: WNL, completed by: Dr. Elon Alas Immunizations: tdap 01/2016, Influenza administered today.  shingrix series completed.  covid series completed Infectious disease screening: HIV completed. hep C screen completed DEXA:  vit d def, estrogen def and hyperparathyroidism. > normal. Rpt 56yr Essential hypertension/HLD/on statin therapy Pt reports compliance with amlodipine 2.5 mg QD and Lipitor.Patient denies chest pain, shortness of breath, dizziness or lower extremity edema.  Diet: High-fiber diet low saturated fat, lower sodium Exercise: Not performing routine exercises RF: Hypertension, no history of heart disease and stroke   Torticollis, acquired/Agoraphobia/Anxiety Patient reports compliance with Effexor 150 mg.  She has been receiving Botox injections for her torticollis with Dr. TCarles Collet  Not routinely using propranolol.  Elevated alk phos: Patient presents today for follow-up on her continued abnormal alkaline phosphatase since  November 2020.  Alk phos has slowly declined as the vitamin D and calcium levels have been supplemented into normal range.  Vitamin D had been rather significantly low initially at 10.  In attempting to decipher if alk phos was related to bone or other cause, GGT was found to be mildly elevated as well. She is still asymptomatic. Gallstones were present  on [place] Stopped oral calcium and continued vit d.      05/30/2022    9:51 AM 10/10/2021   10:28 AM 02/10/2021    9:35 AM 06/28/2020    8:31 AM 06/24/2019    8:10 AM  Depression screen PHQ 2/9  Decreased Interest 0 0 0 0 0  Down, Depressed, Hopeless 0 0 0 0 0  PHQ - 2 Score 0 0 0 0 0  Altered sleeping 1  0  0  Tired, decreased energy 0  0  0  Change in appetite 0  0  0  Feeling bad or failure about yourself  0  0  0  Trouble concentrating 0  0  0  Moving slowly or fidgety/restless 0  0  0  Suicidal thoughts 0  0  0  PHQ-9 Score 1  0  0  Difficult doing work/chores     Not difficult at all      05/30/2022    9:51 AM 02/10/2021    9:35 AM 06/24/2019    8:10 AM 12/11/2018    8:20 AM  GAD 7 : Generalized Anxiety Score  Nervous, Anxious, on Edge 0 0 0 0  Control/stop worrying 0 0 0 0  Worry too much - different things 0 0 0 0  Trouble relaxing 0 0 1 0  Restless 0 0 0 0  Easily annoyed or irritable 0 0 0 0  Afraid - awful might happen 0 0 0 0  Total GAD 7 Score 0 0 1 0  Anxiety Difficulty   Not difficult at all Not difficult at all    Immunization History  Administered Date(s) Administered   Influenza Inj Mdck Quad Pf 08/26/2018   Influenza, Quadrivalent, Recombinant, Inj, Pf 05/18/2019   Influenza,inj,Quad PF,6+ Mos 07/27/2015, 09/05/2016, 06/28/2020, 06/02/2021, 05/30/2022   Influenza-Unspecified 06/17/2013, 06/17/2014, 06/05/2017   PFIZER(Purple Top)SARS-COV-2 Vaccination 10/27/2019, 11/24/2019, 08/20/2020   Pfizer Covid-19 Vaccine Bivalent Booster 11yr & up 05/31/2021   Tdap 01/25/2016   Zoster Recombinat (Shingrix)  06/12/2018, 10/10/2018     Past Medical History:  Diagnosis Date   Anxiety    Depression    Fibroadenoma of breast    H/O IN LEFT BREAST NO CHANGE PER [place]   Gallstones 2021   Heart murmur    mild mitral stenosis and aortic stenosis by prior PCP note. Saw Dr. rMauricio Poin 2013-2015.unable to view echo or records surrounding condition.    High cholesterol    Hyperparathyroidism ([place] 10/30/2019   Hypertension    LBBB (left bundle branch block) 2013   saw Dr. RMauricio Po- stable   Migraine headache    PMS (premenstrual syndrome)    Allergies  Allergen Reactions   Penicillins Hives   Sulfa Antibiotics Hives   Zoloft [Sertraline Hcl]    Past Surgical History:  Procedure Laterality Date   ADENOIDECTOMY     CESAREAN SECTION     x2   Family History  Problem Relation Age of Onset   Heart disease Mother    Hearing loss Mother    Heart disease Father    Hypertension Father    Stroke Father    Arthritis Father    Drug abuse Brother        Commited suicide   Hypertension Maternal Grandfather    Hypertension Paternal Grandfather    Heart disease Paternal Grandfather    Diabetes Mellitus II Maternal Aunt    Diabetes Mellitus II Paternal Aunt    Heart disease Paternal Uncle    Colon cancer Neg Hx    Breast cancer Neg Hx    Hyperparathyroidism Neg Hx    Social History   Social History Narrative   Married. Spouse is name BSuezanne Jacquet 3 children.   High school graduate. Full-time employed. 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   Drinks caffeinated beverages, no tobacco, no alcohol, no recreational drugs.   Wears her seatbelt. Smoke detectors located in the home. Firearms located in the home in a locked case.   Feels safe in her relationship.      Right Handed    Lives in a one story home    Allergies as of 05/30/2022       Reactions   Penicillins Hives   Sulfa Antibiotics Hives   Zoloft [sertraline Hcl]         Medication List        Accurate as of May 30, 2022  9:57 AM.  If you have any questions, ask your nurse or doctor.          amLODipine 2.5 MG tablet Commonly known as: NORVASC Take 1 tablet (2.5 mg total) by mouth daily.   Botox 100 units Solr injection Generic drug: botulinum toxin Type A Inject 200 units in patients neck and head every 90 days here in the office by the MD   Dialyvite Vitamin D 5000 125 MCG (5000  UT) capsule Generic drug: Cholecalciferol Take 1 capsule (5,000 Units total) by mouth daily.   latanoprost 0.005 % ophthalmic solution Commonly known as: XALATAN Apply to eye.   propranolol 20 MG tablet Commonly known as: INDERAL Take 1 tablet (20 mg total) by mouth 2 (two) times daily as needed.   venlafaxine XR 150 MG 24 hr capsule Commonly known as: EFFEXOR-XR Take 1 capsule (150 mg total) by mouth daily with breakfast.   Xeomin 100 units Solr injection Generic drug: incobotulinumtoxinA Inject 300 units into neck and head every 90 days by the MD in the MD office        All past medical history, surgical history, allergies, family history, immunizations andmedications were updated in the EMR today and reviewed under the history and medication portions of their EMR.     No results found for this or any previous visit (from the past 2160 hour(s)).  MM 3D SCREEN BREAST BILATERAL  Result Date: 03/09/2022 CLINICAL DATA:  Screening. EXAM: DIGITAL SCREENING BILATERAL MAMMOGRAM WITH TOMOSYNTHESIS AND CAD TECHNIQUE: Bilateral screening digital craniocaudal and mediolateral oblique mammograms were obtained. Bilateral screening digital breast tomosynthesis was performed. The images were evaluated with computer-aided detection. COMPARISON:  Previous exam(s). ACR Breast Density Category b: There are scattered areas of fibroglandular density. FINDINGS: There are no findings suspicious for malignancy. IMPRESSION: No mammographic evidence of malignancy. A result letter of this screening mammogram will be mailed directly to the patient.  RECOMMENDATION: Screening mammogram in one year. (Code:SM-B-01Y) BI-RADS CATEGORY  1: Negative. Electronically Signed   By: Valentino Saxon M.D.   On: 03/09/2022 16:49     ROS 14 pt review of systems performed and negative (unless mentioned in an HPI)  Objective: BP 135/88   Pulse 75   Temp 98 F (36.7 C)   Ht 5' 5.35" (1.66 m)   Wt 150 lb 12.8 oz (68.4 kg)   LMP 09/07/2017   SpO2 98%   BMI 24.82 kg/m  Physical Exam Vitals and nursing note reviewed.  Constitutional:      General: She is not in acute distress.    Appearance: Normal appearance. She is not ill-appearing or toxic-appearing.  HENT:     Head: Normocephalic and atraumatic.     Right Ear: Tympanic membrane, ear canal and external ear normal. There is no impacted cerumen.     Left Ear: Tympanic membrane, ear canal and external ear normal. There is no impacted cerumen.     Nose: No congestion or rhinorrhea.     Mouth/Throat:     Mouth: Mucous membranes are moist.     Pharynx: Oropharynx is clear. No oropharyngeal exudate or posterior oropharyngeal erythema.  Eyes:     General: No scleral icterus.       Right eye: No discharge.        Left eye: No discharge.     Extraocular Movements: Extraocular movements intact.     Conjunctiva/sclera: Conjunctivae normal.     Pupils: Pupils are equal, round, and reactive to light.  Cardiovascular:     Rate and Rhythm: Normal rate and regular rhythm.     Pulses: Normal pulses.     Heart sounds: Normal heart sounds. No murmur heard.    No friction rub. No gallop.  Pulmonary:     Effort: Pulmonary effort is normal. No respiratory distress.     Breath sounds: Normal breath sounds. No stridor. No wheezing, rhonchi or rales.  Chest:     Chest wall: No tenderness.  Abdominal:  General: Abdomen is flat. Bowel sounds are normal. There is no distension.     Palpations: Abdomen is soft. There is no mass.     Tenderness: There is no abdominal tenderness. There is no right CVA  tenderness, left CVA tenderness, guarding or rebound.     Hernia: No hernia is present.  Musculoskeletal:        General: No swelling, tenderness or deformity. Normal range of motion.     Cervical back: Normal range of motion and neck supple. No rigidity or tenderness.     Right lower leg: No edema.     Left lower leg: No edema.  Lymphadenopathy:     Cervical: No cervical adenopathy.  Skin:    General: Skin is warm and dry.     Coloration: Skin is not jaundiced or pale.     Findings: No bruising, erythema, lesion or rash.  Neurological:     General: No focal deficit present.     Mental Status: She is alert and oriented to person, place, and time. Mental status is at baseline.     Cranial Nerves: No cranial nerve deficit.     Sensory: No sensory deficit.     Motor: No weakness.     Coordination: Coordination normal.     Gait: Gait normal.     Deep Tendon Reflexes: Reflexes normal.  Psychiatric:        Mood and Affect: Mood normal.        Behavior: Behavior normal.        Thought Content: Thought content normal.        Judgment: Judgment normal.     No results found.  Assessment/plan: Alexandria Weber is a 56 y.o. female present for CPE/CMC Elevated alkaline phosphatase level/vit d def/low calcium/hyperparthyroid/elevated GGT/estrogen deficient Patient is still asymptomatic. She has continue to supplement with vitamin D.  Last parathyroid hormone levels were normal once vitamin D levels normalized. Gallstones are present on ultrasound and are the most likely cause for her continued elevated alk phos.  She had elected to not pursue surgical consultation as of yet. Vitamin D collected today -DEXA up-to-date 12/07/2020-normal   Essential hypertension/HLD/on statin therapy Borderline- she will monitor and if still elevated next visit will increase amlodipine.  Continue  amlodipine 2.5 mg qd Continue to work on higher fiber diet and routine exercise.  Continue atorvastatin 10 mg  daily.   CBC, CMP, TSH, lipids collected today   Torticollis, acquired Stable Continue follow ups/botox with Dr. Carles Collet  Anxiety Stable Eye pressures are up> poss related to effexor> decrease dose at that time" > ophthalmologist> not related to effexor  Continue effexor 150 mg qd. Continue propanolol 20 mg qd prn for anxiety. (Pt asked for med)  Encounter for long-term current use of medication - Hemoglobin A1c Vitamin D deficiency - VITAMIN D 25 Hydroxy (Vit-D Deficiency, Fractures) Colon cancer screening - Cologuard Routine general medical examination at a health care facility Colonoscopy: cologuard completed 07/2019- rpt 07/2022 - ordered Mammogram: completed:12/2020, birads 1. No fhx.. Breast center [place]>>ordered Cervical cancer screening: last pap: 09/2016, results: WNL, completed by: Dr. Elon Alas Immunizations: tdap 01/2016, Influenza administered today.  shingrix series completed.  covid series completed Infectious disease screening: HIV completed. hep C screen completed DEXA:  vit d def, estrogen def and hyperparathyroidism. > normal. Rpt 59yrPatient was encouraged to exercise greater than 150 minutes a week. Patient was encouraged to choose a diet filled with fresh fruits and vegetables, and lean meats. AVS provided  to patient today for education/recommendation on gender specific health and safety maintenance.  Return in about 24 weeks (around 11/14/2022) for Routine chronic condition follow-up.  Orders Placed This Encounter  Procedures   Flu Vaccine QUAD 36moIM (Fluarix, Fluzone & Alfiuria Quad PF)   CBC with Differential/Platelet   Comprehensive metabolic panel   Hemoglobin A1c   Lipid panel   TSH   VITAMIN D 25 Hydroxy (Vit-D Deficiency, Fractures)   Cologuard   \ Meds ordered this encounter  Medications   amLODipine (NORVASC) 2.5 MG tablet    Sig: Take 1 tablet (2.5 mg total) by mouth daily.    Dispense:  90 tablet    Refill:  1   venlafaxine XR (EFFEXOR-XR)  150 MG 24 hr capsule    Sig: Take 1 capsule (150 mg total) by mouth daily with breakfast.    Dispense:  90 capsule    Refill:  1   Referral Orders  No referral(s) requested today     Electronically signed by: RHoward Pouch DWood Dale

## 2022-06-02 ENCOUNTER — Ambulatory Visit (INDEPENDENT_AMBULATORY_CARE_PROVIDER_SITE_OTHER): Payer: 59 | Admitting: Neurology

## 2022-06-02 DIAGNOSIS — G243 Spasmodic torticollis: Secondary | ICD-10-CM | POA: Diagnosis not present

## 2022-06-02 MED ORDER — INCOBOTULINUMTOXINA 100 UNITS IM SOLR
300.0000 [IU] | INTRAMUSCULAR | Status: DC
  Administered 2022-06-02: 260 [IU] via INTRAMUSCULAR

## 2022-06-02 NOTE — Procedures (Signed)
[facility]   Procedure Note Botox  Attending: Dr. Wells Guiles Angelissa Supan  Preoperative Diagnosis(es): Cervical Dystonia  Result History  Doing well with xeomin and PT  Consent obtained from: The patient Benefits discussed included, but were not limited to decreased muscle tightness, increased joint range of motion, and decreased pain.  Risk discussed included, but were not limited pain and discomfort, bleeding, bruising, excessive weakness, venous thrombosis, muscle atrophy and dysphagia.  A copy of the patient medication guide was given to the patient which explains the blackbox warning.  Patients identity and treatment sites confirmed Yes.  .  Details of Procedure: Skin was cleaned with alcohol.  A 30 gauge, 9m  needle was introduced to the target muscle, except for posterior splenius where 27 gauge, 1.5 inch needle used.   Prior to injection, the needle plunger was aspirated to make sure the needle was not within a blood vessel.  There was no blood retrieved on aspiration.    Following is a summary of the muscles injected  And the amount of Botulinum toxin used:   Dilution 0.9% preservative free saline mixed with 100 u Botox type A to make 10 U per 0.1cc  Injections  Location Left  Right Units Number of sites        Sternocleidomastoid 60  60 1  Splenius Capitus, posterior approach  100 100 1  Splenius Capitus, lateral approach  50 50 1  Levator Scapulae      Trapezius 20/20/10  50 3        TOTAL UNITS:   260     Agent: Botulinum Toxin type A (Xeomin).    3vials of Botox were used, each containing 100 units and freshly diluted with 1 mL of sterile, non-preserved saline   Total injected (Units): 260  Total wasted (Units):40   Pt tolerated procedure well without complications.   Reinjection is anticipated in 4 months.

## 2022-06-20 ENCOUNTER — Other Ambulatory Visit (HOSPITAL_COMMUNITY): Payer: Self-pay

## 2022-07-20 ENCOUNTER — Other Ambulatory Visit (HOSPITAL_COMMUNITY): Payer: Self-pay

## 2022-07-24 LAB — COLOGUARD: COLOGUARD: NEGATIVE

## 2022-09-28 ENCOUNTER — Other Ambulatory Visit (HOSPITAL_COMMUNITY): Payer: Self-pay

## 2022-10-02 ENCOUNTER — Other Ambulatory Visit: Payer: Self-pay

## 2022-10-02 ENCOUNTER — Other Ambulatory Visit: Payer: Self-pay | Admitting: Neurology

## 2022-10-02 DIAGNOSIS — G243 Spasmodic torticollis: Secondary | ICD-10-CM

## 2022-10-05 ENCOUNTER — Other Ambulatory Visit (HOSPITAL_COMMUNITY): Payer: Self-pay

## 2022-10-05 MED ORDER — XEOMIN 100 UNITS IM SOLR
INTRAMUSCULAR | 1 refills | Status: DC
  Filled 2022-10-05: qty 3, 30d supply, fill #0
  Filled 2023-01-23: qty 3, 30d supply, fill #1

## 2022-10-06 ENCOUNTER — Other Ambulatory Visit (HOSPITAL_COMMUNITY): Payer: Self-pay

## 2022-10-06 ENCOUNTER — Other Ambulatory Visit: Payer: Self-pay

## 2022-10-13 ENCOUNTER — Ambulatory Visit (INDEPENDENT_AMBULATORY_CARE_PROVIDER_SITE_OTHER): Payer: 59 | Admitting: Neurology

## 2022-10-13 DIAGNOSIS — G243 Spasmodic torticollis: Secondary | ICD-10-CM | POA: Diagnosis not present

## 2022-10-13 MED ORDER — INCOBOTULINUMTOXINA 100 UNITS IM SOLR
300.0000 [IU] | INTRAMUSCULAR | Status: DC
  Administered 2022-10-13: 260 [IU] via INTRAMUSCULAR

## 2022-10-13 NOTE — Procedures (Signed)
[facility]   Procedure Note Botox  Attending: Dr. Wells Guiles [facility] [place]  Preoperative Diagnosis(es): Cervical Dystonia  Result History  Doing well   Consent obtained from: The patient Benefits discussed included, but were not limited to decreased muscle tightness, increased joint range of motion, and decreased pain.  Risk discussed included, but were not limited pain and discomfort, bleeding, bruising, excessive weakness, venous thrombosis, muscle atrophy and dysphagia.  A copy of the patient medication guide was given to the patient which explains the blackbox warning.  Patients identity and treatment sites confirmed Yes.  .  Details of Procedure: Skin was cleaned with alcohol.  A 30 gauge, 30m  needle was introduced to the target muscle, except for posterior splenius where 27 gauge, 1.5 inch needle used.   Prior to injection, the needle plunger was aspirated to make sure the needle was not within a blood vessel.  There was no blood retrieved on aspiration.    Following is a summary of the muscles injected  And the amount of Botulinum toxin used:   Dilution 0.9% preservative free saline mixed with 100 u Botox type A to make 10 U per 0.1cc  Injections  Location Left  Right Units Number of sites        Sternocleidomastoid 60  60 1  Splenius Capitus, posterior approach  100 100 1  Splenius Capitus, lateral approach  50 50 1  Levator Scapulae      Trapezius 20/20/10  50 3        TOTAL UNITS:   260     Agent: Botulinum Toxin type A (Xeomin).    3vials of Botox were used, each containing 100 units and freshly diluted with 1 mL of sterile, non-preserved saline   Total injected (Units): 260  Total wasted (Units):40   Pt tolerated procedure well without complications.   Reinjection is anticipated in 4 months.

## 2022-11-14 ENCOUNTER — Encounter: Payer: Self-pay | Admitting: Family Medicine

## 2022-11-14 ENCOUNTER — Ambulatory Visit (INDEPENDENT_AMBULATORY_CARE_PROVIDER_SITE_OTHER): Payer: 59 | Admitting: Family Medicine

## 2022-11-14 VITALS — BP 134/84 | HR 73 | Temp 98.3°F | Wt 153.2 lb

## 2022-11-14 DIAGNOSIS — M436 Torticollis: Secondary | ICD-10-CM | POA: Diagnosis not present

## 2022-11-14 DIAGNOSIS — I10 Essential (primary) hypertension: Secondary | ICD-10-CM

## 2022-11-14 DIAGNOSIS — F419 Anxiety disorder, unspecified: Secondary | ICD-10-CM

## 2022-11-14 DIAGNOSIS — R748 Abnormal levels of other serum enzymes: Secondary | ICD-10-CM

## 2022-11-14 DIAGNOSIS — G243 Spasmodic torticollis: Secondary | ICD-10-CM

## 2022-11-14 DIAGNOSIS — E782 Mixed hyperlipidemia: Secondary | ICD-10-CM

## 2022-11-14 DIAGNOSIS — Z79899 Other long term (current) drug therapy: Secondary | ICD-10-CM

## 2022-11-14 MED ORDER — VENLAFAXINE HCL ER 150 MG PO CP24: 150.0000 mg | ORAL_CAPSULE | Freq: Every day | ORAL | 2 refills | Status: DC

## 2022-11-14 MED ORDER — AMLODIPINE BESYLATE 5 MG PO TABS: 5.0000 mg | ORAL_TABLET | Freq: Every day | ORAL | 2 refills | Status: DC

## 2022-11-14 NOTE — Progress Notes (Signed)
Patient ID: Alexandria Weber, female  DOB: 11/15/1965, 57 y.o.   MRN: [id number] Patient Care Team    Relationship Specialty Notifications Start End  Natalia Leatherwood, DO PCP - General Family Medicine  07/27/15   Associates, [facility] Ob/Gyn    07/13/21   Tat, Octaviano Batty, DO Consulting Physician Neurology  07/19/21     Chief Complaint  Patient presents with   Anxiety   Hypertension    Subjective:  Alexandria Weber is a 57 y.o.  Female  present for  Chronic Conditions/illness Management  All past medical history, surgical history, allergies, family history, immunizations, medications and social history were updated in the electronic medical record today. All recent labs, ED visits and hospitalizations within the last year were reviewed.  Essential hypertension/HLD/on statin therapy Pt reports compliance with amlodipine 2.5 mg QD and Lipitor. Patient denies chest pain, shortness of breath, dizziness or lower extremity edema.   Diet: High-fiber diet low saturated fat, lower sodium Exercise: Not performing routine exercises RF: Hypertension, no history of heart disease and stroke   Torticollis, acquired/Agoraphobia/Anxiety Patient reports compliance with Effexor 150 mg.  She has been receiving Botox injections for her torticollis with Dr. Arbutus Leas.  Not routinely using propranolol.      11/14/2022   10:18 AM 05/30/2022    9:51 AM 10/10/2021   10:28 AM 02/10/2021    9:35 AM 06/28/2020    8:31 AM  Depression screen PHQ 2/9  Decreased Interest 0 0 0 0 0  Down, Depressed, Hopeless 0 0 0 0 0  PHQ - 2 Score 0 0 0 0 0  Altered sleeping 0 1  0   Tired, decreased energy 0 0  0   Change in appetite 0 0  0   Feeling bad or failure about yourself  0 0  0   Trouble concentrating 0 0  0   Moving slowly or fidgety/restless 0 0  0   Suicidal thoughts 0 0  0   PHQ-9 Score 0 1  0       05/30/2022    9:51 AM 02/10/2021    9:35 AM 06/24/2019    8:10 AM 12/11/2018    8:20 AM  GAD 7 :  Generalized Anxiety Score  Nervous, Anxious, on Edge 0 0 0 0  Control/stop worrying 0 0 0 0  Worry too much - different things 0 0 0 0  Trouble relaxing 0 0 1 0  Restless 0 0 0 0  Easily annoyed or irritable 0 0 0 0  Afraid - awful might happen 0 0 0 0  Total GAD 7 Score 0 0 1 0  Anxiety Difficulty   Not difficult at all Not difficult at all    Immunization History  Administered Date(s) Administered   Influenza Inj Mdck Quad Pf 08/26/2018   Influenza, Quadrivalent, Recombinant, Inj, Pf 05/18/2019   Influenza,inj,Quad PF,6+ Mos 07/27/2015, 09/05/2016, 06/28/2020, 06/02/2021, 05/30/2022   Influenza-Unspecified 06/17/2013, 06/17/2014, 06/05/2017   PFIZER(Purple Top)SARS-COV-2 Vaccination 10/27/2019, 11/24/2019, 08/20/2020   Pfizer Covid-19 Vaccine Bivalent Booster 64yrs & up 05/31/2021   Tdap 01/25/2016   Zoster Recombinat (Shingrix) 06/12/2018, 10/10/2018     Past Medical History:  Diagnosis Date   Anxiety    Depression    Elevated CK 11/15/2021   Fibroadenoma of breast    H/O IN LEFT BREAST NO CHANGE PER [place]   Gallstones 2021   Heart murmur    mild mitral stenosis and aortic stenosis by prior PCP note. Saw Dr.  renaldo in 2013-2015.unable to view echo or records surrounding condition.    High cholesterol    Hyperparathyroidism 10/30/2019   Hypertension    LBBB (left bundle branch block) 2013   saw Dr. Leeann Must - stable   Migraine headache    PMS (premenstrual syndrome)    Allergies  Allergen Reactions   Penicillins Hives   Sulfa Antibiotics Hives   Zoloft [Sertraline Hcl]    Past Surgical History:  Procedure Laterality Date   ADENOIDECTOMY     CESAREAN SECTION     x2   Family History  Problem Relation Age of Onset   Heart disease Mother    Hearing loss Mother    Heart disease Father    Hypertension Father    Stroke Father    Arthritis Father    Drug abuse Brother        Commited suicide   Hypertension Maternal Grandfather    Hypertension Paternal  Grandfather    Heart disease Paternal Grandfather    Diabetes Mellitus II Maternal Aunt    Diabetes Mellitus II Paternal Aunt    Heart disease Paternal Uncle    Colon cancer Neg Hx    Breast cancer Neg Hx    Hyperparathyroidism Neg Hx    Social History   Social History Narrative   Married. Spouse is name Romeo Apple. 3 children.   High school graduate. Full-time employed. Location manager at a bank.   Drinks caffeinated beverages, no tobacco, no alcohol, no recreational drugs.   Wears her seatbelt. Smoke detectors located in the home. Firearms located in the home in a locked case.   Feels safe in her relationship.      Right Handed    Lives in a one story home    Allergies as of 11/14/2022       Reactions   Penicillins Hives   Sulfa Antibiotics Hives   Zoloft [sertraline Hcl]         Medication List        Accurate as of November 14, 2022 10:25 AM. If you have any questions, ask your nurse or doctor.          STOP taking these medications    Botox 100 units Solr injection Generic drug: botulinum toxin Type A Stopped by: Felix Pacini, DO       TAKE these medications    amLODipine 5 MG tablet Commonly known as: NORVASC Take 1 tablet (5 mg total) by mouth daily. What changed:  medication strength how much to take Changed by: Felix Pacini, DO   Dialyvite Vitamin D 5000 125 MCG (5000 UT) capsule Generic drug: Cholecalciferol Take 1 capsule (5,000 Units total) by mouth daily.   latanoprost 0.005 % ophthalmic solution Commonly known as: XALATAN Apply to eye.   propranolol 20 MG tablet Commonly known as: INDERAL Take 1 tablet (20 mg total) by mouth 2 (two) times daily as needed.   venlafaxine XR 150 MG 24 hr capsule Commonly known as: EFFEXOR-XR Take 1 capsule (150 mg total) by mouth daily with breakfast.   Xeomin 100 units Solr injection Generic drug: incobotulinumtoxinA Inject 300 units into neck and head every 90 days by the MD in the MD office         All past medical history, surgical history, allergies, family history, immunizations andmedications were updated in the EMR today and reviewed under the history and medication portions of their EMR.     No results found for this or any previous visit (from  the past 2160 hour(s)).   ROS 14 pt review of systems performed and negative (unless mentioned in an HPI)  Objective: BP 134/84   Pulse 73   Temp 98.3 F (36.8 C)   Wt 153 lb 3.2 oz (69.5 kg)   LMP 09/07/2017   SpO2 96%   BMI 25.22 kg/m  Physical Exam Vitals and nursing note reviewed.  Constitutional:      General: She is not in acute distress.    Appearance: Normal appearance. She is not ill-appearing, toxic-appearing or diaphoretic.  HENT:     Head: Normocephalic and atraumatic.  Eyes:     General: No scleral icterus.       Right eye: No discharge.        Left eye: No discharge.     Extraocular Movements: Extraocular movements intact.     Conjunctiva/sclera: Conjunctivae normal.     Pupils: Pupils are equal, round, and reactive to light.  Cardiovascular:     Rate and Rhythm: Normal rate and regular rhythm.  Pulmonary:     Effort: Pulmonary effort is normal. No respiratory distress.     Breath sounds: Normal breath sounds. No wheezing, rhonchi or rales.  Musculoskeletal:     Right lower leg: No edema.     Left lower leg: No edema.  Skin:    General: Skin is warm.     Findings: No rash.  Neurological:     Mental Status: She is alert and oriented to person, place, and time. Mental status is at baseline.     Motor: No weakness.     Gait: Gait normal.  Psychiatric:        Mood and Affect: Mood normal.        Behavior: Behavior normal.        Thought Content: Thought content normal.        Judgment: Judgment normal.    No results found.  Assessment/plan: Ahmed PrimaKimberly Bolyard is a 57 y.o. female present for routine chronic conditions Essential hypertension/HLD/on statin therapy Still borderline increase  amlodipine 2.5 mg> 5mg   qd Continue to work on higher fiber diet and routine exercise.  Continue atorvastatin 10 mg daily.   Labs up-to-date   Torticollis, acquired Stable Continue follow ups/botox with Dr. Arbutus Leasat  Anxiety Stable Eye pressures are up> poss related to effexor> decrease dose at that time" > ophthalmologist> not related to effexor  Continue effexor 150 mg qd. Continue propanolol 20 mg qd prn for anxiety. (Pt asked for med)  Return in about 7 months (around 06/01/2023) for cpe (20 min), Routine chronic condition follow-up.  No orders of the defined types were placed in this encounter.  \ Meds ordered this encounter  Medications   amLODipine (NORVASC) 5 MG tablet    Sig: Take 1 tablet (5 mg total) by mouth daily.    Dispense:  90 tablet    Refill:  2   venlafaxine XR (EFFEXOR-XR) 150 MG 24 hr capsule    Sig: Take 1 capsule (150 mg total) by mouth daily with breakfast.    Dispense:  90 capsule    Refill:  2   Referral Orders  No referral(s) requested today     Electronically signed by: Felix Pacinienee Champion Corales, DO [facility]

## 2022-11-15 ENCOUNTER — Ambulatory Visit (INDEPENDENT_AMBULATORY_CARE_PROVIDER_SITE_OTHER): Payer: 59 | Admitting: Radiology

## 2022-11-15 ENCOUNTER — Encounter: Payer: Self-pay | Admitting: Radiology

## 2022-11-15 ENCOUNTER — Other Ambulatory Visit (HOSPITAL_COMMUNITY)
Admission: RE | Admit: 2022-11-15 | Discharge: 2022-11-15 | Disposition: A | Discharge status: Home / Self Care | Payer: 59 | Source: Ambulatory Visit | Attending: Radiology | Admitting: Radiology

## 2022-11-15 VITALS — BP 116/78 | Ht 65.25 in | Wt 151.0 lb

## 2022-11-15 DIAGNOSIS — Z01419 Encounter for gynecological examination (general) (routine) without abnormal findings: Secondary | ICD-10-CM | POA: Insufficient documentation

## 2022-11-15 DIAGNOSIS — N952 Postmenopausal atrophic vaginitis: Secondary | ICD-10-CM | POA: Diagnosis not present

## 2022-11-15 DIAGNOSIS — N958 Other specified menopausal and perimenopausal disorders: Secondary | ICD-10-CM

## 2022-11-15 MED ORDER — ESTRADIOL 10 MCG VA TABS: 1.0000 | ORAL_TABLET | VAGINAL | 6 refills | Status: DC

## 2022-11-15 NOTE — Progress Notes (Signed)
   Alexandria Weber [birth year] [id number]   History: Postmenopausal 57 y.o. presents for annual exam. C/o vaginal dryness and dyspareunia. Not using a lubricant with intercourse. Doing well otherwise. Norvasc dose was increased yesterday by PCP for HTN.    Gynecologic History Postmenopausal Last Pap: 2018. Results were: normal Last mammogram: 03/2022. Results were: normal Last colonoscopy: cologuard 2023 HRT use: never  Obstetric History OB History  Gravida Para Term Preterm AB Living  3 3     0 3  SAB IAB Ectopic Multiple Live Births  0   0        # Outcome Date GA Lbr Len/2nd Weight Sex Delivery Anes PTL Lv  3 Para           2 Para           1 Para              The following portions of the patient's history were reviewed and updated as appropriate: allergies, current medications, past family history, past medical history, past social history, past surgical history, and problem list.  Review of Systems Pertinent items noted in HPI and remainder of comprehensive ROS otherwise negative.  Past medical history, past surgical history, family history and social history were all reviewed and documented in the EPIC chart.  Exam:  Vitals:   11/15/22 1148  BP: 116/78  Weight: 151 lb (68.5 kg)  Height: 5' 5.25" (1.657 m)   Body mass index is 24.94 kg/m.  General appearance:  Normal Thyroid:  Symmetrical, normal in size, without palpable masses or nodularity. Respiratory  Auscultation:  Clear without wheezing or rhonchi Cardiovascular  Auscultation:  Regular rate, without rubs, murmurs or gallops  Edema/varicosities:  Not grossly evident Abdominal  Soft,nontender, without masses, guarding or rebound.  Liver/spleen:  No organomegaly noted  Hernia:  None appreciated  Skin  Inspection:  Grossly normal Breasts: Examined lying and sitting.   Right: Without masses, retractions, nipple discharge or axillary adenopathy.   Left: Without masses, retractions, nipple discharge or  axillary adenopathy. Genitourinary   Inguinal/mons:  Normal without inguinal adenopathy  External genitalia:  Normal appearing vulva with no masses, tenderness, or lesions  BUS/Urethra/Skene's glands:  Normal  Vagina:  Normal appearing with normal color and discharge, no lesions. Atrophy: moderate   Cervix:  Normal appearing without discharge or lesions  Uterus:  Normal in size, shape and contour.  Midline and mobile, nontender  Adnexa/parametria:     Rt: Normal in size, without masses or tenderness.   Lt: Normal in size, without masses or tenderness.  Anus and perineum: Normal    Raynelle Fanning, CMA present for exam  Assessment/Plan:   1. Well woman exam with routine gynecological exam - Cytology - PAP( [facility])  2. Genitourinary syndrome of menopause Discussed option for treatment, would prefer to try a tablet, discussed use Coconut oil recommended as a lubricant - Estradiol (YUVAFEM) 10 MCG TABS vaginal tablet; Place 1 tablet (10 mcg total) vaginally 2 (two) times a week.  Dispense: 8 tablet; Refill: 6    Discussed SBE, colonoscopy and DEXA screening as directed. Recommend of exercise weekly, including weight bearing exercise. Encouraged the use of seatbelts and sunscreen.  Return in 1 year for annual or sooner prn.  Arlie Solomons B WHNP-BC, 12:05 PM 11/15/2022

## 2022-11-17 LAB — CYTOLOGY - PAP
Comment: NEGATIVE
Diagnosis: NEGATIVE
High risk HPV: NEGATIVE

## 2022-12-26 ENCOUNTER — Other Ambulatory Visit (HOSPITAL_COMMUNITY): Payer: Self-pay

## 2022-12-27 ENCOUNTER — Other Ambulatory Visit (HOSPITAL_COMMUNITY): Payer: Self-pay

## 2023-01-12 ENCOUNTER — Telehealth: Payer: Self-pay | Admitting: Internal Medicine

## 2023-01-12 NOTE — Telephone Encounter (Signed)
Crystal from [facility] called stating they sent over pathology report in April of 2023 and patient was scheduled to review them with Dr. Dimple Casey at her appointment on 12/01/21.  Please fax office notes from 12/01/21 and 03/28/22.   Fax #[phone]

## 2023-01-12 NOTE — Telephone Encounter (Signed)
Office notes faxed as requested.

## 2023-01-22 ENCOUNTER — Other Ambulatory Visit (HOSPITAL_COMMUNITY): Payer: Self-pay

## 2023-01-22 ENCOUNTER — Telehealth: Payer: Self-pay | Admitting: Pharmacy Technician

## 2023-01-22 NOTE — Telephone Encounter (Signed)
Patient Advocate Encounter  Received notification from [facility] that prior authorization for XEOMIN 100U is required.   PA submitted on 6.17.24 Key [id number] Status is pending

## 2023-01-23 ENCOUNTER — Other Ambulatory Visit (HOSPITAL_COMMUNITY): Payer: Self-pay

## 2023-01-31 ENCOUNTER — Other Ambulatory Visit: Payer: Self-pay

## 2023-01-31 NOTE — Telephone Encounter (Signed)
RF request for amlodipine 2.5mg  LOV: 11/14/22 Next ov: 06/20/23 Last written: 05/30/22 (90,1)  Provider changed dose to 5mg  as of 11/14/22

## 2023-02-02 ENCOUNTER — Other Ambulatory Visit (HOSPITAL_COMMUNITY): Payer: Self-pay

## 2023-02-05 ENCOUNTER — Other Ambulatory Visit: Payer: Self-pay

## 2023-02-05 NOTE — Telephone Encounter (Signed)
RF request for amlodipine LOV: 11/14/22 Next ov: 06/20/23 Last written: 11/14/22 (90,2)  9 month supply to [facility]  Refill too early based on last rx

## 2023-02-16 ENCOUNTER — Ambulatory Visit (INDEPENDENT_AMBULATORY_CARE_PROVIDER_SITE_OTHER): Payer: 59 | Admitting: Neurology

## 2023-02-16 DIAGNOSIS — G243 Spasmodic torticollis: Secondary | ICD-10-CM | POA: Diagnosis not present

## 2023-02-16 MED ORDER — INCOBOTULINUMTOXINA 100 UNITS IM SOLR
300.0000 [IU] | INTRAMUSCULAR | Status: DC
  Administered 2023-02-16: 260 [IU] via INTRAMUSCULAR

## 2023-02-16 NOTE — Addendum Note (Signed)
Addended by: Ila Mcgill C on: 02/16/2023 01:50 PM   Modules accepted: Orders

## 2023-02-16 NOTE — Procedures (Signed)
[facility]   Procedure Note Botox  Attending: Dr. Donavan Kerlin  Preoperative Diagnosis(es): Cervical Dystonia  Result History  Doing well   Consent obtained from: The patient Benefits discussed included, but were not limited to decreased muscle tightness, increased joint range of motion, and decreased pain.  Risk discussed included, but were not limited pain and discomfort, bleeding, bruising, excessive weakness, venous thrombosis, muscle atrophy and dysphagia.  A copy of the patient medication guide was given to the patient which explains the blackbox warning.  Patients identity and treatment sites confirmed Yes.  .  Details of Procedure: Skin was cleaned with alcohol.  A 30 gauge, 25mm  needle was introduced to the target muscle, except for posterior splenius where 27 gauge, 1.5 inch needle used.   Prior to injection, the needle plunger was aspirated to make sure the needle was not within a blood vessel.  There was no blood retrieved on aspiration.    Following is a summary of the muscles injected  And the amount of Botulinum toxin used:   Dilution 0.9% preservative free saline mixed with 100 u Botox type A to make 10 U per 0.1cc  Injections  Location Left  Right Units Number of sites        Sternocleidomastoid 60  60 1  Splenius Capitus, posterior approach  100 100 1  Splenius Capitus, lateral approach  50 50 1  Levator Scapulae      Trapezius 20/20/10  50 3        TOTAL UNITS:   260     Agent: Botulinum Toxin type A (Xeomin).    3vials of Botox were used, each containing 100 units and freshly diluted with 1 mL of sterile, non-preserved saline   Total injected (Units): 260  Total wasted (Units):40   Pt tolerated procedure well without complications.   Reinjection is anticipated in 4 months.  

## 2023-02-24 ENCOUNTER — Other Ambulatory Visit (HOSPITAL_COMMUNITY): Payer: Self-pay

## 2023-03-14 ENCOUNTER — Other Ambulatory Visit (HOSPITAL_COMMUNITY): Payer: Self-pay

## 2023-05-02 ENCOUNTER — Other Ambulatory Visit: Payer: Self-pay

## 2023-05-02 ENCOUNTER — Other Ambulatory Visit: Payer: Self-pay | Admitting: Neurology

## 2023-05-02 DIAGNOSIS — G243 Spasmodic torticollis: Secondary | ICD-10-CM

## 2023-05-02 NOTE — Progress Notes (Addendum)
Specialty Pharmacy Refill Coordination Note  Alexandria Weber is a 57 y.o. female contacted today regarding refills of specialty medication(s) Incobotulinumtoxina .  Patient requested Courier to Provider Office  on 06/08/23  to verified address [facility] [address], [address]   Medication will be filled on 06/07/23. Medication is pending refill.

## 2023-05-03 ENCOUNTER — Other Ambulatory Visit (HOSPITAL_COMMUNITY): Payer: Self-pay

## 2023-05-03 ENCOUNTER — Telehealth: Payer: Self-pay | Admitting: Pharmacy Technician

## 2023-05-03 ENCOUNTER — Other Ambulatory Visit: Payer: Self-pay

## 2023-05-03 MED ORDER — XEOMIN 100 UNITS IM SOLR
INTRAMUSCULAR | 1 refills | Status: DC
  Filled 2023-05-03: qty 3, 30d supply, fill #0
  Filled 2023-10-18: qty 3, 30d supply, fill #1

## 2023-05-03 NOTE — Telephone Encounter (Signed)
Pharmacy Patient Advocate Encounter   Received notification from Physician's Office that prior authorization for XEOMIN 300U is required/requested.   Insurance verification completed.   The patient is insured through [facility] .   Per test claim: PA required; PA submitted to [facility] via CoverMyMeds Key/confirmation #/EOC [id number] Status is pending

## 2023-05-03 NOTE — Progress Notes (Signed)
Refill received, scheduled to Courier to MD's office on 11/01

## 2023-05-16 ENCOUNTER — Ambulatory Visit (INDEPENDENT_AMBULATORY_CARE_PROVIDER_SITE_OTHER): Payer: 59

## 2023-05-16 DIAGNOSIS — Z23 Encounter for immunization: Secondary | ICD-10-CM

## 2023-05-16 NOTE — Progress Notes (Signed)
Pt in for regular dose flu vaccine per Dr. Claiborne Billings.  Injection tolerated well.

## 2023-05-18 ENCOUNTER — Other Ambulatory Visit (HOSPITAL_COMMUNITY): Payer: Self-pay

## 2023-05-18 NOTE — Telephone Encounter (Signed)
Pharmacy Patient Advocate Encounter  Received notification from [facility] that Prior Authorization for XEOMIN 300U has been APPROVED from 9.26.24 to 9.25.25   PA #/Case ID/Reference #: [id number]   Being filled at [facility]

## 2023-06-07 ENCOUNTER — Other Ambulatory Visit: Payer: Self-pay

## 2023-06-07 ENCOUNTER — Other Ambulatory Visit (HOSPITAL_COMMUNITY): Payer: Self-pay

## 2023-06-08 ENCOUNTER — Other Ambulatory Visit (HOSPITAL_COMMUNITY): Payer: Self-pay

## 2023-06-20 ENCOUNTER — Ambulatory Visit: Payer: 59 | Admitting: Family Medicine

## 2023-06-20 ENCOUNTER — Encounter: Payer: Self-pay | Admitting: Family Medicine

## 2023-06-20 VITALS — BP 130/88 | HR 80 | Temp 97.9°F | Ht 65.0 in | Wt 146.0 lb

## 2023-06-20 DIAGNOSIS — Z1231 Encounter for screening mammogram for malignant neoplasm of breast: Secondary | ICD-10-CM

## 2023-06-20 DIAGNOSIS — I10 Essential (primary) hypertension: Secondary | ICD-10-CM

## 2023-06-20 DIAGNOSIS — Z0001 Encounter for general adult medical examination with abnormal findings: Secondary | ICD-10-CM | POA: Diagnosis not present

## 2023-06-20 DIAGNOSIS — Z131 Encounter for screening for diabetes mellitus: Secondary | ICD-10-CM | POA: Diagnosis not present

## 2023-06-20 DIAGNOSIS — F419 Anxiety disorder, unspecified: Secondary | ICD-10-CM

## 2023-06-20 DIAGNOSIS — G720 Drug-induced myopathy: Secondary | ICD-10-CM | POA: Insufficient documentation

## 2023-06-20 DIAGNOSIS — R748 Abnormal levels of other serum enzymes: Secondary | ICD-10-CM | POA: Diagnosis not present

## 2023-06-20 DIAGNOSIS — E559 Vitamin D deficiency, unspecified: Secondary | ICD-10-CM | POA: Diagnosis not present

## 2023-06-20 DIAGNOSIS — T466X5A Adverse effect of antihyperlipidemic and antiarteriosclerotic drugs, initial encounter: Secondary | ICD-10-CM

## 2023-06-20 DIAGNOSIS — E782 Mixed hyperlipidemia: Secondary | ICD-10-CM | POA: Diagnosis not present

## 2023-06-20 DIAGNOSIS — Z0000 Encounter for general adult medical examination without abnormal findings: Secondary | ICD-10-CM

## 2023-06-20 DIAGNOSIS — I050 Rheumatic mitral stenosis: Secondary | ICD-10-CM

## 2023-06-20 LAB — COMPREHENSIVE METABOLIC PANEL
ALT: 14 U/L (ref 0–35)
AST: 21 U/L (ref 0–37)
Albumin: 4.8 g/dL (ref 3.5–5.2)
Alkaline Phosphatase: 118 U/L — ABNORMAL HIGH (ref 39–117)
BUN: 12 mg/dL (ref 6–23)
CO2: 34 meq/L — ABNORMAL HIGH (ref 19–32)
Calcium: 9.2 mg/dL (ref 8.4–10.5)
Chloride: 100 meq/L (ref 96–112)
Creatinine, Ser: 0.76 mg/dL (ref 0.40–1.20)
GFR: 88.25 mL/min (ref >60.00)
Glucose, Bld: 80 mg/dL (ref 70–99)
Potassium: 3.6 meq/L (ref 3.5–5.1)
Sodium: 142 meq/L (ref 135–145)
Total Bilirubin: 0.7 mg/dL (ref 0.2–1.2)
Total Protein: 7.2 g/dL (ref 6.0–8.3)

## 2023-06-20 LAB — LIPID PANEL
Cholesterol: 227 mg/dL — ABNORMAL HIGH (ref 0–200)
HDL: 58.9 mg/dL (ref >39.00)
LDL Cholesterol: 134 mg/dL — ABNORMAL HIGH (ref 0–99)
NonHDL: 168.05
Total CHOL/HDL Ratio: 4
Triglycerides: 169 mg/dL — ABNORMAL HIGH (ref 0.0–149.0)
VLDL: 33.8 mg/dL (ref 0.0–40.0)

## 2023-06-20 LAB — CBC
HCT: 44.1 % (ref 36.0–46.0)
Hemoglobin: 14.8 g/dL (ref 12.0–15.0)
MCHC: 33.7 g/dL (ref 30.0–36.0)
MCV: 92.5 fL (ref 78.0–100.0)
Platelets: 314 10*3/uL (ref 150.0–400.0)
RBC: 4.77 Mil/uL (ref 3.87–5.11)
RDW: 12.6 % (ref 11.5–15.5)
WBC: 3.7 10*3/uL — ABNORMAL LOW (ref 4.0–10.5)

## 2023-06-20 LAB — VITAMIN D 25 HYDROXY (VIT D DEFICIENCY, FRACTURES): VITD: 54.46 ng/mL (ref 30.00–100.00)

## 2023-06-20 LAB — HEMOGLOBIN A1C: Hgb A1c MFr Bld: 5.4 % (ref 4.6–6.5)

## 2023-06-20 LAB — TSH: TSH: 1.57 u[IU]/mL (ref 0.35–5.50)

## 2023-06-20 MED ORDER — VENLAFAXINE HCL ER 150 MG PO CP24: 150.0000 mg | ORAL_CAPSULE | Freq: Every day | ORAL | 1 refills | Status: DC

## 2023-06-20 MED ORDER — AMLODIPINE BESYLATE 5 MG PO TABS
5.0000 mg | ORAL_TABLET | Freq: Every day | ORAL | 1 refills | Status: DC
Start: 2023-06-20 — End: 2023-12-06

## 2023-06-20 NOTE — Patient Instructions (Signed)

## 2023-06-20 NOTE — Progress Notes (Signed)
Patient ID: Alexandria Weber, female  DOB: 1965/11/23, 57 y.o.   MRN: [id number] Patient Care Team    Relationship Specialty Notifications Start End  Natalia Leatherwood, DO PCP - General Family Medicine  07/27/15   Associates, [facility]    07/13/21   Tat, Octaviano Batty, DO Consulting Physician Neurology  07/19/21     Chief Complaint  Patient presents with   Annual Exam    Pt is fasting    Subjective:  Alexandria Weber is a 57 y.o.  Female  present for  Annual Preventive exam and combined Chronic Conditions/illness Management All past medical history, surgical history, allergies, family history, immunizations, medications and social history were updated in the electronic medical record today. All recent labs, ED visits and hospitalizations within the last year were reviewed.  Health maintenance:  Colon cancer screening: cologuard completed 07/17/2022, repeat 3 years Mammogram: completed: 03/08/2022,  No fhx.. [facility] [place]ordered Cervical cancer screening: last pap: 11/15/2022, results: WNL/negative HPV completed by: Dr. Maryelizabeth Rowan Immunizations: tdap UTD 01/2016, Influenza UTD 2024.  shingrix series completed.   Infectious disease screening: HIV completed. hep C screen completed DEXA:  vit d def, estrogen def and hyperparathyroidism. > normal.  12/07/2020 Rpt 40yr  Essential hypertension/HLD/on statin therapy Pt reports compliance with amlodipine 5 mg QD. Patient denies chest pain, shortness of breath, dizziness or lower extremity edema.  Had been on statin, myalgias. Diet: High-fiber diet low saturated fat, lower sodium Exercise: Not performing routine exercises RF: Hypertension, no history of heart disease and stroke   Anxiety Patient reports compliance with Effexor 150 mg.  Patient feels medication is working well for her      11/14/2022   10:18 AM 05/30/2022    9:51 AM 10/10/2021   10:28 AM 02/10/2021    9:35 AM 06/28/2020    8:31 AM  Depression screen PHQ 2/9   Decreased Interest 0 0 0 0 0  Down, Depressed, Hopeless 0 0 0 0 0  PHQ - 2 Score 0 0 0 0 0  Altered sleeping 0 1  0   Tired, decreased energy 0 0  0   Change in appetite 0 0  0   Feeling bad or failure about yourself  0 0  0   Trouble concentrating 0 0  0   Moving slowly or fidgety/restless 0 0  0   Suicidal thoughts 0 0  0   PHQ-9 Score 0 1  0       05/30/2022    9:51 AM 02/10/2021    9:35 AM 06/24/2019    8:10 AM 12/11/2018    8:20 AM  GAD 7 : Generalized Anxiety Score  Nervous, Anxious, on Edge 0 0 0 0  Control/stop worrying 0 0 0 0  Worry too much - different things 0 0 0 0  Trouble relaxing 0 0 1 0  Restless 0 0 0 0  Easily annoyed or irritable 0 0 0 0  Afraid - awful might happen 0 0 0 0  Total GAD 7 Score 0 0 1 0  Anxiety Difficulty   Not difficult at all Not difficult at all    Immunization History  Administered Date(s) Administered   Influenza Inj Mdck Quad Pf 08/26/2018   Influenza, Quadrivalent, Recombinant, Inj, Pf 05/18/2019   Influenza, Seasonal, Injecte, Preservative Fre 05/16/2023   Influenza,inj,Quad PF,6+ Mos 07/27/2015, 09/05/2016, 06/28/2020, 06/02/2021, 05/30/2022   Influenza-Unspecified 06/17/2013, 06/17/2014, 06/05/2017   PFIZER(Purple Top)SARS-COV-2 Vaccination 10/27/2019, 11/24/2019, 08/20/2020   Pfizer  Covid-19 Vaccine Bivalent Booster 20yrs & up 05/31/2021   Tdap 01/25/2016   Zoster Recombinant(Shingrix) 06/12/2018, 10/10/2018     Past Medical History:  Diagnosis Date   Anxiety    Depression    Elevated bilirubin 07/14/2021   Elevated CK 11/15/2021   Elevated LFTs 10/10/2021   Fibroadenoma of breast    H/O IN LEFT BREAST NO CHANGE PER [place]   Gallstones 2021   Heart murmur    mild mitral stenosis and aortic stenosis by prior PCP note. Saw Dr. Leeann Must in 2013-2015.unable to view echo or records surrounding condition.    High cholesterol    Hyperparathyroidism (HCC) 10/30/2019   Hypertension    LBBB (left bundle branch block) 2013   saw  Dr. Leeann Must - stable   Migraine headache    Muscle weakness 10/10/2021   PMS (premenstrual syndrome)    Positive ANA (antinuclear antibody) 11/15/2021   Allergies  Allergen Reactions   Penicillins Hives   Sulfa Antibiotics Hives   Lipitor [Atorvastatin] Rash   Zoloft [Sertraline Hcl]    Past Surgical History:  Procedure Laterality Date   ADENOIDECTOMY     CESAREAN SECTION     x2   Family History  Problem Relation Age of Onset   Heart disease Mother    Hearing loss Mother    Diabetes Mother    Heart disease Father    Hypertension Father    Stroke Father    Arthritis Father    Drug abuse Brother        Commited suicide   Diabetes Mellitus II Maternal Aunt    Diabetes Mellitus II Paternal Aunt    Heart disease Paternal Uncle    Hypertension Maternal Grandfather    Hypertension Paternal Grandfather    Heart disease Paternal Grandfather    Colon cancer Neg Hx    Breast cancer Neg Hx    Hyperparathyroidism Neg Hx    Social History   Social History Narrative   Married. Spouse is name Alexandria Weber. 3 children.   High school graduate. Full-time employed. Location manager at a bank.   Drinks caffeinated beverages, no tobacco, no alcohol, no recreational drugs.   Wears her seatbelt. Smoke detectors located in the home. Firearms located in the home in a locked case.   Feels safe in her relationship.      Right Handed    Lives in a one story home    Allergies as of 06/20/2023       Reactions   Penicillins Hives   Sulfa Antibiotics Hives   Lipitor [atorvastatin] Rash   Zoloft [sertraline Hcl]         Medication List        Accurate as of June 20, 2023  8:25 AM. If you have any questions, ask your nurse or doctor.          amLODipine 5 MG tablet Commonly known as: NORVASC Take 1 tablet (5 mg total) by mouth daily.   Dialyvite Vitamin D 5000 125 MCG (5000 UT) capsule Generic drug: Cholecalciferol Take 1 capsule (5,000 Units total) by mouth daily.    Estradiol 10 MCG Tabs vaginal tablet Commonly known as: Yuvafem Place 1 tablet (10 mcg total) vaginally 2 (two) times a week.   latanoprost 0.005 % ophthalmic solution Commonly known as: XALATAN 1 drop at bedtime.   multivitamin capsule Take 1 capsule by mouth daily.   venlafaxine XR 150 MG 24 hr capsule Commonly known as: EFFEXOR-XR Take 1 capsule (150 mg total)  by mouth daily with breakfast.   Xeomin 100 units Solr injection Generic drug: incobotulinumtoxinA Inject 300 units into neck and head every 90 days by the MD in the MD office        All past medical history, surgical history, allergies, family history, immunizations andmedications were updated in the EMR today and reviewed under the history and medication portions of their EMR.     No results found for this or any previous visit (from the past 2160 hour(s)).   ROS 14 pt review of systems performed and negative (unless mentioned in an HPI)  Objective: BP 130/88   Pulse 80   Temp 97.9 F (36.6 C)   Ht 5\' 5"  (1.651 m)   Wt 146 lb (66.2 kg)   LMP 09/07/2017   SpO2 97%   BMI 24.30 kg/m  Physical Exam Vitals and nursing note reviewed.  Constitutional:      General: She is not in acute distress.    Appearance: Normal appearance. She is not ill-appearing or toxic-appearing.  HENT:     Head: Normocephalic and atraumatic.     Right Ear: Tympanic membrane, ear canal and external ear normal. There is no impacted cerumen.     Left Ear: Tympanic membrane, ear canal and external ear normal. There is no impacted cerumen.     Nose: No congestion or rhinorrhea.     Mouth/Throat:     Mouth: Mucous membranes are moist.     Pharynx: Oropharynx is clear. No oropharyngeal exudate or posterior oropharyngeal erythema.  Eyes:     General: No scleral icterus.       Right eye: No discharge.        Left eye: No discharge.     Extraocular Movements: Extraocular movements intact.     Conjunctiva/sclera: Conjunctivae normal.      Pupils: Pupils are equal, round, and reactive to light.  Cardiovascular:     Rate and Rhythm: Normal rate and regular rhythm.     Pulses: Normal pulses.     Heart sounds: Normal heart sounds. No murmur heard.    No friction rub. No gallop.  Pulmonary:     Effort: Pulmonary effort is normal. No respiratory distress.     Breath sounds: Normal breath sounds. No stridor. No wheezing, rhonchi or rales.  Chest:     Chest wall: No tenderness.  Abdominal:     General: Abdomen is flat. Bowel sounds are normal. There is no distension.     Palpations: Abdomen is soft. There is no mass.     Tenderness: There is no abdominal tenderness. There is no right CVA tenderness, left CVA tenderness, guarding or rebound.     Hernia: No hernia is present.  Musculoskeletal:        General: No swelling, tenderness or deformity. Normal range of motion.     Cervical back: Normal range of motion and neck supple. No rigidity or tenderness.     Right lower leg: No edema.     Left lower leg: No edema.  Lymphadenopathy:     Cervical: No cervical adenopathy.  Skin:    General: Skin is warm and dry.     Coloration: Skin is not jaundiced or pale.     Findings: No bruising, erythema, lesion or rash.  Neurological:     General: No focal deficit present.     Mental Status: She is alert and oriented to person, place, and time. Mental status is at baseline.     Cranial  Nerves: No cranial nerve deficit.     Sensory: No sensory deficit.     Motor: No weakness.     Coordination: Coordination normal.     Gait: Gait normal.     Deep Tendon Reflexes: Reflexes normal.  Psychiatric:        Mood and Affect: Mood normal.        Behavior: Behavior normal.        Thought Content: Thought content normal.        Judgment: Judgment normal.    No results found.  Assessment/plan: Jaisley Synnott is a 57 y.o. female present for annual preventative exam and combined chronic condition management appointment Essential  hypertension/HLD/mitral valve stenosis/statin myopathy Stable Continue amlodipine 5 mg daily Continue to work on higher fiber diet and routine exercise.  Patient elected to discontinue Lipitor 10 mg every other day secondary to continued myalgias Lipid panel collected today  Hemoglobin A1c - Lipid panel Anxiety Stable Eye pressures are up> poss related to effexor> decrease dose at that time > ophthalmologist> elevated pressures not related to effexor > diagnosed glaucoma and under treatment Continue effexor 150 mg qd. Breast cancer screening by mammogram - MM 3D SCREENING MAMMOGRAM BILATERAL BREAST; Future Vitamin D deficiency - Vitamin D (25 hydroxy) Diabetes mellitus screening A1c collected today Routine general medical examination at a health care facility - CBC - Comprehensive metabolic panel - Hemoglobin A1c - TSH Patient was encouraged to exercise greater than 150 minutes a week. Patient was encouraged to choose a diet filled with fresh fruits and vegetables, and lean meats. AVS provided to patient today for education/recommendation on gender specific health and safety maintenance. Colon cancer screening: cologuard completed 07/17/2022, repeat 3 years Mammogram: completed: 03/08/2022,  No fhx.. [facility] center [place]>>ordered Cervical cancer screening: last pap: 11/15/2022, results: WNL/negative HPV completed by: Dr. Maryelizabeth Rowan Immunizations: tdap UTD 01/2016, Influenza UTD 2024.  shingrix series completed.   Infectious disease screening: HIV completed. hep C screen completed DEXA:  vit d def, estrogen def and hyperparathyroidism. > normal.  12/07/2020 Rpt 67yr  Return in about 6 months (around 12/06/2023) for Routine chronic condition follow-up.  Orders Placed This Encounter  Procedures   MM 3D SCREENING MAMMOGRAM BILATERAL BREAST   CBC   Comprehensive metabolic panel   Hemoglobin A1c   TSH   Lipid panel   Vitamin D (25 hydroxy)    Meds ordered this encounter  Medications    amLODipine (NORVASC) 5 MG tablet    Sig: Take 1 tablet (5 mg total) by mouth daily.    Dispense:  90 tablet    Refill:  1   venlafaxine XR (EFFEXOR-XR) 150 MG 24 hr capsule    Sig: Take 1 capsule (150 mg total) by mouth daily with breakfast.    Dispense:  90 capsule    Refill:  1   Referral Orders  No referral(s) requested today     Electronically signed by: Felix Pacini, DO [facility]

## 2023-06-21 ENCOUNTER — Ambulatory Visit
Admission: RE | Admit: 2023-06-21 | Discharge: 2023-06-21 | Disposition: A | Discharge status: Home / Self Care | Payer: 59 | Source: Ambulatory Visit | Attending: Family Medicine | Admitting: Family Medicine

## 2023-06-21 DIAGNOSIS — Z1231 Encounter for screening mammogram for malignant neoplasm of breast: Secondary | ICD-10-CM

## 2023-06-22 ENCOUNTER — Ambulatory Visit (INDEPENDENT_AMBULATORY_CARE_PROVIDER_SITE_OTHER): Payer: 59 | Admitting: Neurology

## 2023-06-22 DIAGNOSIS — G243 Spasmodic torticollis: Secondary | ICD-10-CM | POA: Diagnosis not present

## 2023-06-22 MED ORDER — INCOBOTULINUMTOXINA 100 UNITS IM SOLR
300.0000 [IU] | INTRAMUSCULAR | Status: DC
  Administered 2023-06-22: 300 [IU] via INTRAMUSCULAR

## 2023-06-22 NOTE — Procedures (Signed)
[facility]   Procedure Note Botox  Attending: Dr. Lurena Joiner Jud Fanguy  Preoperative Diagnosis(es): Cervical Dystonia  Result History  Doing well but can tell its wearing off  Consent obtained from: The patient Benefits discussed included, but were not limited to decreased muscle tightness, increased joint range of motion, and decreased pain.  Risk discussed included, but were not limited pain and discomfort, bleeding, bruising, excessive weakness, venous thrombosis, muscle atrophy and dysphagia.  A copy of the patient medication guide was given to the patient which explains the blackbox warning.  Patients identity and treatment sites confirmed Yes.  .  Details of Procedure: Skin was cleaned with alcohol.  A 30 gauge, 25mm  needle was introduced to the target muscle, except for posterior splenius where 27 gauge, 1.5 inch needle used.   Prior to injection, the needle plunger was aspirated to make sure the needle was not within a blood vessel.  There was no blood retrieved on aspiration.    Following is a summary of the muscles injected  And the amount of incobotulinumtoxin A used:   Dilution 0.9% preservative free saline mixed with 100 u incobotulinumtoxin A to make 10 U per 0.1cc  Injections  Location Left  Right Units Number of sites        Sternocleidomastoid 60  60 1  Splenius Capitus, posterior approach  100 100 1  Splenius Capitus, lateral approach  50 50 1  Levator Scapulae      Trapezius 20/20/10  50 3        TOTAL UNITS:   260     Agent: Botulinum Toxin type A (Xeomin).    3vials of Botox were used, each containing 100 units and freshly diluted with 1 mL of sterile, non-preserved saline   Total injected (Units): 260  Total wasted (Units):40   Pt tolerated procedure well without complications.   Reinjection is anticipated in 4 months.

## 2023-08-07 ENCOUNTER — Encounter: Payer: Self-pay | Admitting: Neurology

## 2023-09-07 ENCOUNTER — Other Ambulatory Visit: Payer: Self-pay

## 2023-09-07 ENCOUNTER — Other Ambulatory Visit (HOSPITAL_COMMUNITY): Payer: Self-pay

## 2023-09-07 NOTE — Progress Notes (Signed)
Specialty Pharmacy Refill Coordination Note  Alexandria Weber is a 58 y.o. female contacted today regarding refills of specialty medication(s) IncobotulinumtoxinA [name]   Patient requested Courier to Provider Office   Delivery date: 10/15/23   Verified address: [facility] [address], [address]   Medication will be filled on 03.07.25.

## 2023-10-18 ENCOUNTER — Other Ambulatory Visit: Payer: Self-pay

## 2023-10-19 ENCOUNTER — Ambulatory Visit: Payer: 59 | Admitting: Neurology

## 2023-10-25 ENCOUNTER — Ambulatory Visit (INDEPENDENT_AMBULATORY_CARE_PROVIDER_SITE_OTHER): Payer: 59 | Admitting: Neurology

## 2023-10-25 DIAGNOSIS — G243 Spasmodic torticollis: Secondary | ICD-10-CM | POA: Diagnosis not present

## 2023-10-25 MED ORDER — INCOBOTULINUMTOXINA 100 UNITS IM SOLR
300.0000 [IU] | INTRAMUSCULAR | Status: DC
  Administered 2023-10-25: 260 [IU] via INTRAMUSCULAR

## 2023-10-25 NOTE — Procedures (Signed)
 [facility]   Procedure Note Botox  Attending: Dr. Lurena Joiner Jud Fanguy  Preoperative Diagnosis(es): Cervical Dystonia  Result History  Doing well but can tell its wearing off  Consent obtained from: The patient Benefits discussed included, but were not limited to decreased muscle tightness, increased joint range of motion, and decreased pain.  Risk discussed included, but were not limited pain and discomfort, bleeding, bruising, excessive weakness, venous thrombosis, muscle atrophy and dysphagia.  A copy of the patient medication guide was given to the patient which explains the blackbox warning.  Patients identity and treatment sites confirmed Yes.  .  Details of Procedure: Skin was cleaned with alcohol.  A 30 gauge, 25mm  needle was introduced to the target muscle, except for posterior splenius where 27 gauge, 1.5 inch needle used.   Prior to injection, the needle plunger was aspirated to make sure the needle was not within a blood vessel.  There was no blood retrieved on aspiration.    Following is a summary of the muscles injected  And the amount of incobotulinumtoxin A used:   Dilution 0.9% preservative free saline mixed with 100 u incobotulinumtoxin A to make 10 U per 0.1cc  Injections  Location Left  Right Units Number of sites        Sternocleidomastoid 60  60 1  Splenius Capitus, posterior approach  100 100 1  Splenius Capitus, lateral approach  50 50 1  Levator Scapulae      Trapezius 20/20/10  50 3        TOTAL UNITS:   260     Agent: Botulinum Toxin type A (Xeomin).    3vials of Botox were used, each containing 100 units and freshly diluted with 1 mL of sterile, non-preserved saline   Total injected (Units): 260  Total wasted (Units):40   Pt tolerated procedure well without complications.   Reinjection is anticipated in 4 months.

## 2023-12-07 ENCOUNTER — Encounter: Payer: Self-pay | Admitting: Family Medicine

## 2023-12-07 ENCOUNTER — Ambulatory Visit (INDEPENDENT_AMBULATORY_CARE_PROVIDER_SITE_OTHER): Payer: 59 | Admitting: Family Medicine

## 2023-12-07 VITALS — BP 114/72 | HR 66 | Temp 97.9°F | Wt 148.2 lb

## 2023-12-07 DIAGNOSIS — G720 Drug-induced myopathy: Secondary | ICD-10-CM

## 2023-12-07 DIAGNOSIS — I050 Rheumatic mitral stenosis: Secondary | ICD-10-CM | POA: Diagnosis not present

## 2023-12-07 DIAGNOSIS — T466X5A Adverse effect of antihyperlipidemic and antiarteriosclerotic drugs, initial encounter: Secondary | ICD-10-CM

## 2023-12-07 DIAGNOSIS — F419 Anxiety disorder, unspecified: Secondary | ICD-10-CM

## 2023-12-07 DIAGNOSIS — I10 Essential (primary) hypertension: Secondary | ICD-10-CM

## 2023-12-07 DIAGNOSIS — E782 Mixed hyperlipidemia: Secondary | ICD-10-CM | POA: Diagnosis not present

## 2023-12-07 MED ORDER — VENLAFAXINE HCL ER 150 MG PO CP24: 150.0000 mg | ORAL_CAPSULE | Freq: Every day | ORAL | 2 refills | Status: DC

## 2023-12-07 MED ORDER — AMLODIPINE BESYLATE 5 MG PO TABS
5.0000 mg | ORAL_TABLET | Freq: Every day | ORAL | 2 refills | Status: DC
Start: 2023-12-07 — End: 2024-06-19

## 2023-12-07 NOTE — Patient Instructions (Signed)

## 2023-12-07 NOTE — Progress Notes (Signed)
 Patient ID: Alexandria Weber, female  DOB: 03-Nov-1965, 58 y.o.   MRN: [id number] Patient Care Team    Relationship Specialty Notifications Start End  Mariel Shope, DO PCP - General Family Medicine  07/27/15   [facility], [facility] Ob/Gyn    07/13/21   Tat, Von Grumbling, DO Consulting Physician Neurology  07/19/21     Chief Complaint  Patient presents with   Depression    Subjective:  Alexandria Weber is a 58 y.o.  Female  present for chronic condition management All past medical history, surgical history, allergies, family history, immunizations, medications and social history were updated in the electronic medical record today. All recent labs, ED visits and hospitalizations within the last year were reviewed.  Essential hypertension/HLD/on statin therapy Pt reports compliance with amlodipine  5 mg QD. Patient denies chest pain, shortness of breath, dizziness or lower extremity edema.   Had been on statin, myalgias. Diet: High-fiber diet low saturated fat, lower sodium Exercise: Not performing routine exercises RF: Hypertension, no history of heart disease and stroke   Anxiety Patient reports compliance with Effexor  150 mg.  Patient feels medication is working well l for her      12/07/2023    8:23 AM 11/14/2022   10:18 AM 05/30/2022    9:51 AM 10/10/2021   10:28 AM 02/10/2021    9:35 AM  Depression screen PHQ 2/9  Decreased Interest 0 0 0 0 0  Down, Depressed, Hopeless 0 0 0 0 0  PHQ - 2 Score 0 0 0 0 0  Altered sleeping 0 0 1  0  Tired, decreased energy 0 0 0  0  Change in appetite 0 0 0  0  Feeling bad or failure about yourself  0 0 0  0  Trouble concentrating 0 0 0  0  Moving slowly or fidgety/restless 0 0 0  0  Suicidal thoughts 0 0 0  0  PHQ-9 Score 0 0 1  0  Difficult doing work/chores Not difficult at all          12/07/2023    8:23 AM 05/30/2022    9:51 AM 02/10/2021    9:35 AM 06/24/2019    8:10 AM  GAD 7 : Generalized Anxiety Score  Nervous, Anxious, on  Edge 0 0 0 0  Control/stop worrying 0 0 0 0  Worry too much - different things 0 0 0 0  Trouble relaxing 0 0 0 1  Restless 0 0 0 0  Easily annoyed or irritable 0 0 0 0  Afraid - awful might happen 0 0 0 0  Total GAD 7 Score 0 0 0 1  Anxiety Difficulty Not difficult at all   Not difficult at all    Immunization History  Administered Date(s) Administered   Influenza Inj Mdck Quad Pf 08/26/2018   Influenza, Quadrivalent, Recombinant, Inj, Pf 05/18/2019   Influenza, Seasonal, Injecte, Preservative Fre 05/16/2023   Influenza,inj,Quad PF,6+ Mos 07/27/2015, 09/05/2016, 06/28/2020, 06/02/2021, 05/30/2022   Influenza-Unspecified 06/17/2013, 06/17/2014, 06/05/2017   PFIZER(Purple Top)SARS-COV-2 Vaccination 10/27/2019, 11/24/2019, 08/20/2020   Pfizer Covid-19 Vaccine Bivalent Booster 57yrs & up 05/31/2021   Tdap 01/25/2016   Zoster Recombinant(Shingrix ) 06/12/2018, 10/10/2018     Past Medical History:  Diagnosis Date   Anxiety    Depression    Elevated bilirubin 07/14/2021   Elevated CK 11/15/2021   Elevated LFTs 10/10/2021   Fibroadenoma of breast    H/O IN LEFT BREAST NO CHANGE PER [place]   Gallstones 2021  Heart murmur    mild mitral stenosis and aortic stenosis by prior PCP note. Saw Dr. Ellsworth Haas in 2013-2015.unable to view echo or records surrounding condition.    High cholesterol    Hyperparathyroidism (HCC) 10/30/2019   Hypertension    LBBB (left bundle branch block) 2013   saw Dr. Ellsworth Haas - stable   Migraine headache    Muscle weakness 10/10/2021   PMS (premenstrual syndrome)    Positive ANA (antinuclear antibody) 11/15/2021   Allergies  Allergen Reactions   Penicillins Hives   Sulfa Antibiotics Hives   Lipitor [Atorvastatin ] Rash   Zoloft [Sertraline Hcl]    Past Surgical History:  Procedure Laterality Date   ADENOIDECTOMY     CESAREAN SECTION     x2   Family History  Problem Relation Age of Onset   Heart disease Mother    Hearing loss Mother    Diabetes  Mother    Heart disease Father    Hypertension Father    Stroke Father    Arthritis Father    Drug abuse Brother        Commited suicide   Diabetes Mellitus II Maternal Aunt    Diabetes Mellitus II Paternal Aunt    Heart disease Paternal Uncle    Hypertension Maternal Grandfather    Hypertension Paternal Grandfather    Heart disease Paternal Grandfather    Colon cancer Neg Hx    Breast cancer Neg Hx    Hyperparathyroidism Neg Hx    Social History   Social History Narrative   Married. Spouse is name Lovena Rubinstein. 3 children.   High school graduate. Full-time employed. Location manager at a bank.   Drinks caffeinated beverages, no tobacco, no alcohol, no recreational drugs.   Wears her seatbelt. Smoke detectors located in the home. Firearms located in the home in a locked case.   Feels safe in her relationship.      Right Handed    Lives in a one story home    Allergies as of 12/07/2023       Reactions   Penicillins Hives   Sulfa Antibiotics Hives   Lipitor [atorvastatin ] Rash   Zoloft [sertraline Hcl]         Medication List        Accurate as of Dec 07, 2023  8:40 AM. If you have any questions, ask your nurse or doctor.          amLODipine  5 MG tablet Commonly known as: NORVASC  Take 1 tablet (5 mg total) by mouth daily.   Dialyvite  Vitamin D  5000 125 MCG (5000 UT) capsule Generic drug: Cholecalciferol Take 1 capsule (5,000 Units total) by mouth daily.   Estradiol  10 MCG Tabs vaginal tablet Commonly known as: Yuvafem  Place 1 tablet (10 mcg total) vaginally 2 (two) times a week.   latanoprost 0.005 % ophthalmic solution Commonly known as: XALATAN 1 drop at bedtime.   multivitamin capsule Take 1 capsule by mouth daily.   venlafaxine  XR 150 MG 24 hr capsule Commonly known as: EFFEXOR -XR Take 1 capsule (150 mg total) by mouth daily with breakfast.   Xeomin  100 units Solr injection Generic drug: incobotulinumtoxinA  Inject 300 units into neck and head  every 90 days by the MD in the MD office        All past medical history, surgical history, allergies, family history, immunizations andmedications were updated in the EMR today and reviewed under the history and medication portions of their EMR.     No results  found for this or any previous visit (from the past 2160 hours).   ROS 14 pt review of systems performed and negative (unless mentioned in an HPI)  Objective: BP 114/72   Pulse 66   Temp 97.9 F (36.6 C)   Wt 148 lb 3.2 oz (67.2 kg)   LMP 09/07/2017   SpO2 98%   BMI 24.66 kg/m  Physical Exam Vitals and nursing note reviewed.  Constitutional:      General: She is not in acute distress.    Appearance: Normal appearance. She is not ill-appearing, toxic-appearing or diaphoretic.  HENT:     Head: Normocephalic and atraumatic.  Eyes:     General: No scleral icterus.       Right eye: No discharge.        Left eye: No discharge.     Extraocular Movements: Extraocular movements intact.     Conjunctiva/sclera: Conjunctivae normal.     Pupils: Pupils are equal, round, and reactive to light.  Cardiovascular:     Rate and Rhythm: Normal rate and regular rhythm.  Pulmonary:     Effort: Pulmonary effort is normal. No respiratory distress.     Breath sounds: Normal breath sounds. No wheezing, rhonchi or rales.  Musculoskeletal:     Right lower leg: No edema.     Left lower leg: No edema.  Skin:    General: Skin is warm.     Findings: No rash.  Neurological:     Mental Status: She is alert and oriented to person, place, and time. Mental status is at baseline.     Motor: No weakness.     Gait: Gait normal.  Psychiatric:        Mood and Affect: Mood normal.        Behavior: Behavior normal.        Thought Content: Thought content normal.        Judgment: Judgment normal.    No results found.  Assessment/plan: Alexandria Weber is a 58 y.o. female present for combined chronic condition management  appointment Essential hypertension/HLD/mitral valve stenosis/statin myopathy Stable Continue amlodipine  5 mg daily Continue to work on higher fiber diet and routine exercise.  Patient elected to discontinue Lipitor 10 mg every other day secondary to continued myalgias Labs due next visit   Anxiety Stable Eye pressures are up> poss related to effexor > decrease dose at that time > ophthalmologist> elevated pressures not related to effexor  > diagnosed glaucoma and under treatment Continue effexor  150 mg qd.   Return in about 28 weeks (around 06/20/2024) for cpe (20 min), Routine chronic condition follow-up.  No orders of the defined types were placed in this encounter.   Meds ordered this encounter  Medications   amLODipine  (NORVASC ) 5 MG tablet    Sig: Take 1 tablet (5 mg total) by mouth daily.    Dispense:  90 tablet    Refill:  2   venlafaxine  XR (EFFEXOR -XR) 150 MG 24 hr capsule    Sig: Take 1 capsule (150 mg total) by mouth daily with breakfast.    Dispense:  90 capsule    Refill:  2   Referral Orders  No referral(s) requested today     Electronically signed by: Napolean Backbone, DO [facility] [place]

## 2024-02-01 ENCOUNTER — Ambulatory Visit: Admitting: Neurology

## 2024-02-05 ENCOUNTER — Other Ambulatory Visit (HOSPITAL_COMMUNITY): Payer: Self-pay

## 2024-02-05 ENCOUNTER — Other Ambulatory Visit: Payer: Self-pay | Admitting: Neurology

## 2024-02-05 ENCOUNTER — Other Ambulatory Visit: Payer: Self-pay

## 2024-02-05 DIAGNOSIS — G243 Spasmodic torticollis: Secondary | ICD-10-CM

## 2024-02-05 MED ORDER — XEOMIN 100 UNITS IM SOLR
INTRAMUSCULAR | 2 refills | Status: AC
  Filled 2024-02-05: qty 3, fill #0
  Filled 2024-02-06: qty 3, 30d supply, fill #0
  Filled 2024-07-10: qty 3, 30d supply, fill #1
  Filled ????-??-??: fill #0

## 2024-02-06 ENCOUNTER — Other Ambulatory Visit: Payer: Self-pay | Admitting: Pharmacy Technician

## 2024-02-06 ENCOUNTER — Other Ambulatory Visit: Payer: Self-pay

## 2024-02-06 NOTE — Progress Notes (Signed)
 Specialty Pharmacy Refill Coordination Note  Alexandria Weber is a 58 y.o. female contacted today regarding refills of specialty medication(s) IncobotulinumtoxinA  (Xeomin )   Patient requested Courier to Provider Office   Delivery date: 02/21/24   Verified address: [facility] [address], [address] [place], [place] [address]   Medication will be filled on 02/20/24.

## 2024-02-20 ENCOUNTER — Other Ambulatory Visit: Payer: Self-pay

## 2024-02-29 ENCOUNTER — Ambulatory Visit (INDEPENDENT_AMBULATORY_CARE_PROVIDER_SITE_OTHER): Admitting: Neurology

## 2024-02-29 DIAGNOSIS — G243 Spasmodic torticollis: Secondary | ICD-10-CM | POA: Diagnosis not present

## 2024-02-29 MED ORDER — INCOBOTULINUMTOXINA 100 UNITS IM SOLR
300.0000 [IU] | INTRAMUSCULAR | Status: DC
  Administered 2024-02-29: 260 [IU] via INTRAMUSCULAR

## 2024-02-29 NOTE — Procedures (Signed)
 [facility]   Procedure Note Botox   Attending: Dr. Asberry Elfa Wooton  Preoperative Diagnosis(es): Cervical Dystonia  Result History  Medication seems to be working  Consent obtained from: The patient Benefits discussed included, but were not limited to decreased muscle tightness, increased joint range of motion, and decreased pain.  Risk discussed included, but were not limited pain and discomfort, bleeding, bruising, excessive weakness, venous thrombosis, muscle atrophy and dysphagia.  A copy of the patient medication guide was given to the patient which explains the blackbox warning.  Patients identity and treatment sites confirmed Yes.  .  Details of Procedure: Skin was cleaned with alcohol.  A 30 gauge, 25mm  needle was introduced to the target muscle, except for posterior splenius where 27 gauge, 1.5 inch needle used.   Prior to injection, the needle plunger was aspirated to make sure the needle was not within a blood vessel.  There was no blood retrieved on aspiration.    Following is a summary of the muscles injected  And the amount of incobotulinumtoxin A used:   Dilution 0.9% preservative free saline mixed with 100 u incobotulinumtoxin A to make 10 U per 0.1cc  Injections  Location Left  Right Units Number of sites        Sternocleidomastoid 60  60 1  Splenius Capitus, posterior approach  100 100 1  Splenius Capitus, lateral approach  50 50 1  Levator Scapulae      Trapezius 20/20/10  50 3        TOTAL UNITS:   260     Agent: Botulinum Toxin type A  (Xeomin ).    3vials of Botox  were used, each containing 100 units and freshly diluted with 1 mL of sterile, non-preserved saline   Total injected (Units): 260  Total wasted (Units):40   Pt tolerated procedure well without complications.   Reinjection is anticipated in 4 months.

## 2024-04-15 ENCOUNTER — Other Ambulatory Visit (HOSPITAL_COMMUNITY): Payer: Self-pay

## 2024-04-15 ENCOUNTER — Telehealth: Payer: Self-pay | Admitting: Pharmacy Technician

## 2024-04-15 NOTE — Telephone Encounter (Signed)
 Pharmacy Patient Advocate Encounter   Received notification from Fax that prior authorization for XEOMIN  100X3 is required/requested.   Insurance verification completed.   The patient is insured through [facility] .   Per test claim: PA required; PA submitted to above mentioned insurance via Latent Key/confirmation #/EOC [employer] Status is pending

## 2024-05-09 ENCOUNTER — Other Ambulatory Visit (HOSPITAL_COMMUNITY): Payer: Self-pay

## 2024-05-09 NOTE — Telephone Encounter (Signed)
 Pharmacy Patient Advocate Encounter  Received notification from [facility] that Prior Authorization for XEOMIN  100 has been APPROVED from 10.3.25 to 10.3.26. Ran test claim, Copay is $0. This test claim was processed through [facility]- copay amounts may vary at other pharmacies due to pharmacy/plan contracts, or as the patient moves through the different stages of their insurance plan.   PA #/Case ID/Reference #: [id number]  XEOMIN  COPAY CARD REDUCED COPAY DOWN TO $0. WITHOUT COPAY CARD COPAY IS $432.04.

## 2024-06-06 ENCOUNTER — Encounter: Payer: Self-pay | Admitting: Neurology

## 2024-06-19 ENCOUNTER — Other Ambulatory Visit: Payer: Self-pay

## 2024-06-20 ENCOUNTER — Encounter: Payer: Self-pay | Admitting: Family Medicine

## 2024-06-20 ENCOUNTER — Ambulatory Visit: Admitting: Family Medicine

## 2024-06-20 VITALS — BP 120/76 | HR 74 | Temp 97.9°F | Ht 65.5 in | Wt 149.4 lb

## 2024-06-20 DIAGNOSIS — Z1231 Encounter for screening mammogram for malignant neoplasm of breast: Secondary | ICD-10-CM

## 2024-06-20 DIAGNOSIS — Z0000 Encounter for general adult medical examination without abnormal findings: Secondary | ICD-10-CM

## 2024-06-20 DIAGNOSIS — I10 Essential (primary) hypertension: Secondary | ICD-10-CM

## 2024-06-20 DIAGNOSIS — Z131 Encounter for screening for diabetes mellitus: Secondary | ICD-10-CM

## 2024-06-20 DIAGNOSIS — R748 Abnormal levels of other serum enzymes: Secondary | ICD-10-CM | POA: Diagnosis not present

## 2024-06-20 DIAGNOSIS — I050 Rheumatic mitral stenosis: Secondary | ICD-10-CM

## 2024-06-20 DIAGNOSIS — G720 Drug-induced myopathy: Secondary | ICD-10-CM

## 2024-06-20 DIAGNOSIS — E782 Mixed hyperlipidemia: Secondary | ICD-10-CM | POA: Diagnosis not present

## 2024-06-20 DIAGNOSIS — Z23 Encounter for immunization: Secondary | ICD-10-CM

## 2024-06-20 DIAGNOSIS — E559 Vitamin D deficiency, unspecified: Secondary | ICD-10-CM | POA: Diagnosis not present

## 2024-06-20 DIAGNOSIS — F419 Anxiety disorder, unspecified: Secondary | ICD-10-CM

## 2024-06-20 DIAGNOSIS — T466X5A Adverse effect of antihyperlipidemic and antiarteriosclerotic drugs, initial encounter: Secondary | ICD-10-CM

## 2024-06-20 DIAGNOSIS — I350 Nonrheumatic aortic (valve) stenosis: Secondary | ICD-10-CM

## 2024-06-20 LAB — VITAMIN D 25 HYDROXY (VIT D DEFICIENCY, FRACTURES): VITD: 44.53 ng/mL (ref 30.00–100.00)

## 2024-06-20 LAB — CBC
HCT: 42.7 % (ref 36.0–46.0)
Hemoglobin: 14.5 g/dL (ref 12.0–15.0)
MCHC: 34 g/dL (ref 30.0–36.0)
MCV: 91.2 fl (ref 78.0–100.0)
Platelets: 309 K/uL (ref 150.0–400.0)
RBC: 4.68 Mil/uL (ref 3.87–5.11)
RDW: 12.8 % (ref 11.5–15.5)
WBC: 4.1 K/uL (ref 4.0–10.5)

## 2024-06-20 LAB — COMPREHENSIVE METABOLIC PANEL WITH GFR
ALT: 19 U/L (ref 0–35)
AST: 24 U/L (ref 0–37)
Albumin: 4.9 g/dL (ref 3.5–5.2)
Alkaline Phosphatase: 114 U/L (ref 39–117)
BUN: 13 mg/dL (ref 6–23)
CO2: 36 meq/L — ABNORMAL HIGH (ref 19–32)
Calcium: 9 mg/dL (ref 8.4–10.5)
Chloride: 98 meq/L (ref 96–112)
Creatinine, Ser: 0.7 mg/dL (ref 0.40–1.20)
GFR: 96.72 mL/min (ref >60.00)
Glucose, Bld: 81 mg/dL (ref 70–99)
Potassium: 3.3 meq/L — ABNORMAL LOW (ref 3.5–5.1)
Sodium: 142 meq/L (ref 135–145)
Total Bilirubin: 0.9 mg/dL (ref 0.2–1.2)
Total Protein: 7.2 g/dL (ref 6.0–8.3)

## 2024-06-20 LAB — TSH: TSH: 1.52 u[IU]/mL (ref 0.35–5.50)

## 2024-06-20 LAB — LIPID PANEL
Cholesterol: 234 mg/dL — ABNORMAL HIGH (ref 0–200)
HDL: 70.9 mg/dL (ref >39.00)
LDL Cholesterol: 144 mg/dL — ABNORMAL HIGH (ref 0–99)
NonHDL: 162.88
Total CHOL/HDL Ratio: 3
Triglycerides: 92 mg/dL (ref 0.0–149.0)
VLDL: 18.4 mg/dL (ref 0.0–40.0)

## 2024-06-20 MED ORDER — AMLODIPINE BESYLATE 5 MG PO TABS: 5.0000 mg | ORAL_TABLET | Freq: Every day | ORAL | 2 refills | Status: DC

## 2024-06-20 MED ORDER — VENLAFAXINE HCL ER 150 MG PO CP24: 150.0000 mg | ORAL_CAPSULE | Freq: Every day | ORAL | 2 refills | Status: AC

## 2024-06-20 NOTE — Patient Instructions (Addendum)
 Return in about 25 weeks (around 12/12/2024) for Routine chronic condition follow-up.        Great to see you today.  I have refilled the medication(s) we provide.   If labs were collected or images ordered, we will inform you of  results once we have received them and reviewed. We will contact you either by echart message, or telephone call.  Please give ample time to the testing facility, and our office to run,  receive and review results. Please do not call inquiring of results, even if you can see them in your chart. We will contact you as soon as we are able. If it has been over 1 week since the test was completed, and you have not yet heard from us , then please call us .    - echart message- for normal results that have been seen by the patient already.   - telephone call: abnormal results or if patient has not viewed results in their echart.  If a referral to a specialist was entered for you, please call us  in 2 weeks if you have not heard from the specialist office to schedule.

## 2024-06-20 NOTE — Progress Notes (Signed)
 Patient ID: Alexandria Weber, female  DOB: April 13, 1966, 58 y.o.   MRN: [id number] Patient Care Team    Relationship Specialty Notifications Start End  Catherine Charlies LABOR, DO PCP - General Family Medicine  07/27/15   Tat, Asberry RAMAN, DO Consulting Physician Neurology  07/19/21   [facility] Physician Gynecology  02/05/23    Comment: Jami Chrzasnowski, NP    Chief Complaint  Patient presents with   Annual Exam    Pt is fasting Chronic condition management Influenza vaccine- given Prevnar vaccine- given Hep B series- declined    Subjective:  Alexandria Weber is a 58 y.o.  Female  present for CPE and chronic condition management All past medical history, surgical history, allergies, family history, immunizations, medications and social history were updated in the electronic medical record today. All recent labs, ED visits and hospitalizations within the last year were reviewed.  Health maintenance:  Colon cancer screening: cologuard completed 07/17/2022, repeat 3 years Mammogram: completed: 06/21/2023,  No fhx.. [facility] center [place]ordered Cervical cancer screening: last pap: 11/15/2022, results: WNL/negative HPV completed by: [facility] Elnita Bodily, NP Immunizations: tdap UTD 01/2016, Influenza-GIVEN, shingrix  series completed.  Hepatitis B-declined, Prevnar-given Infectious disease screening: HIV completed. hep C screen completed DEXA:  vit d def, estrogen def and hyperparathyroidism. > normal.  12/07/2020 Rpt 10yr  Essential hypertension/HLD/on statin therapy Pt reports compliance with amlodipine  5 mg QD. Patient denies chest pain, shortness of breath, dizziness or lower extremity edema.   Had been on statin, developed myalgias. Diet: High-fiber diet low saturated fat, lower sodium Exercise: Not performing routine exercises RF: Hypertension, no history of heart disease and stroke   Anxiety Patient reports  compliance with Effexor  150 mg.  Patient feels medication is working well for her      06/20/2024    8:00 AM 12/07/2023    8:23 AM 11/14/2022   10:18 AM 05/30/2022    9:51 AM 10/10/2021   10:28 AM  Depression screen PHQ 2/9  Decreased Interest 0 0 0 0 0  Down, Depressed, Hopeless 0 0 0 0 0  PHQ - 2 Score 0 0 0 0 0  Altered sleeping 0 0 0 1   Tired, decreased energy 0 0 0 0   Change in appetite 0 0 0 0   Feeling bad or failure about yourself  0 0 0 0   Trouble concentrating 0 0 0 0   Moving slowly or fidgety/restless 0 0 0 0   Suicidal thoughts 0 0 0 0   PHQ-9 Score 0 0  0  1    Difficult doing work/chores Not difficult at all Not difficult at all        Data saved with a previous flowsheet row definition      12/07/2023    8:23 AM 05/30/2022    9:51 AM 02/10/2021    9:35 AM 06/24/2019    8:10 AM  GAD 7 : Generalized Anxiety Score  Nervous, Anxious, on Edge 0 0 0 0  Control/stop worrying 0 0 0 0  Worry too much - different things 0 0 0 0  Trouble relaxing 0 0 0 1  Restless 0 0 0 0  Easily annoyed or irritable 0 0 0 0  Afraid - awful might happen 0 0 0 0  Total GAD 7 Score 0 0 0 1  Anxiety Difficulty Not difficult at all   Not difficult at all      09/19/2021  8:51 AM 01/24/2022   12:57 PM 11/14/2022   10:15 AM 12/07/2023    8:22 AM 06/20/2024    8:00 AM  Fall Risk  Falls in the past year? 0 0 0 0 0  Was there an injury with Fall? 0 0 0 0 0  Fall Risk Category Calculator 0 0 0 0 0  Fall Risk Category (Retired) Low  Low      (RETIRED) Patient Fall Risk Level  Low fall risk      Patient at Risk for Falls Due to     No Fall Risks  Fall risk Follow up   Falls evaluation completed  Falls evaluation completed     Data saved with a previous flowsheet row definition    Immunization History  Administered Date(s) Administered   Influenza Inj Mdck Quad Pf 08/26/2018   Influenza, Quadrivalent, Recombinant, Inj, Pf 05/18/2019   Influenza, Seasonal, Injecte, Preservative Fre  05/16/2023   Influenza,inj,Quad PF,6+ Mos 07/27/2015, 09/05/2016, 06/28/2020, 06/02/2021, 05/30/2022   Influenza-Unspecified 06/17/2013, 06/17/2014, 06/05/2017, 06/28/2020   PFIZER(Purple Top)SARS-COV-2 Vaccination 10/27/2019, 11/24/2019, 08/20/2020   Pfizer Covid-19 Vaccine Bivalent Booster 8yrs & up 05/31/2021   Tdap 01/25/2016   Zoster Recombinant(Shingrix ) 06/12/2018, 10/10/2018     Past Medical History:  Diagnosis Date   Anxiety    Depression    Elevated bilirubin 07/14/2021   Elevated CK 11/15/2021   Elevated LFTs 10/10/2021   Fibroadenoma of breast    H/O IN LEFT BREAST NO CHANGE PER [place]   Gallstones 2021   Heart murmur    mild mitral stenosis and aortic stenosis by prior PCP note. Saw Dr. gerard in 2013-2015.unable to view echo or records surrounding condition.    High cholesterol    Hyperparathyroidism 10/30/2019   Hypertension    LBBB (left bundle branch block) 2013   saw Dr. Gerard - stable   Migraine headache    Muscle weakness 10/10/2021   PMS (premenstrual syndrome)    Positive ANA (antinuclear antibody) 11/15/2021   Torticollis, acquired 07/27/2015   [facility] orthopedics providing Botox  injections, Dr. Bonner     Allergies  Allergen Reactions   Penicillins Hives   Sulfa Antibiotics Hives   Lipitor [Atorvastatin ] Rash   Zoloft [Sertraline Hcl]    Past Surgical History:  Procedure Laterality Date   ADENOIDECTOMY     CESAREAN SECTION     x2   Family History  Problem Relation Age of Onset   Heart disease Mother    Hearing loss Mother    Diabetes Mother    Heart disease Father    Hypertension Father    Stroke Father    Arthritis Father    Drug abuse Brother        Commited suicide   Diabetes Mellitus II Maternal Aunt    Diabetes Mellitus II Paternal Aunt    Heart disease Paternal Uncle    Hypertension Maternal Grandfather    Hypertension Paternal Grandfather    Heart disease Paternal Grandfather    Colon cancer Neg Hx    Breast cancer Neg  Hx    Hyperparathyroidism Neg Hx    Social History   Social History Narrative   Married. Spouse is name Odis. 3 children.   High school graduate. Full-time employed. Location Manager at a bank.   Drinks caffeinated beverages, no tobacco, no alcohol, no recreational drugs.   Wears her seatbelt. Smoke detectors located in the home. Firearms located in the home in a locked case.   Feels safe in her  relationship.      Right Handed    Lives in a one story home    Allergies as of 06/20/2024       Reactions   Penicillins Hives   Sulfa Antibiotics Hives   Lipitor [atorvastatin ] Rash   Zoloft [sertraline Hcl]         Medication List        Accurate as of June 20, 2024  8:11 AM. If you have any questions, ask your nurse or doctor.          STOP taking these medications    Dialyvite  Vitamin D  5000 125 MCG (5000 UT) capsule Generic drug: Cholecalciferol Stopped by: Tayt Moyers   Estradiol  10 MCG Tabs vaginal tablet Commonly known as: Yuvafem  Stopped by: Charlies Bellini       TAKE these medications    amLODipine  5 MG tablet Commonly known as: NORVASC  Take 1 tablet (5 mg total) by mouth daily.   latanoprost 0.005 % ophthalmic solution Commonly known as: XALATAN 1 drop at bedtime.   multivitamin capsule Take 1 capsule by mouth daily.   venlafaxine  XR 150 MG 24 hr capsule Commonly known as: EFFEXOR -XR Take 1 capsule (150 mg total) by mouth daily with breakfast.   Xeomin  100 units Solr injection Generic drug: incobotulinumtoxinA  Inject 300 units into neck and head every 90 days by the MD in the MD office        All past medical history, surgical history, allergies, family history, immunizations andmedications were updated in the EMR today and reviewed under the history and medication portions of their EMR.     No results found for this or any previous visit (from the past 2160 hours).   Review of Systems  All other systems reviewed and are  negative.  14 pt review of systems performed and negative (unless mentioned in an HPI)  Objective: BP 120/76   Pulse 74   Temp 97.9 F (36.6 C)   Ht 5' 5.5 (1.664 m)   Wt 149 lb 6.4 oz (67.8 kg)   LMP 09/07/2017   SpO2 98%   BMI 24.48 kg/m  Physical Exam Vitals and nursing note reviewed.  Constitutional:      General: She is not in acute distress.    Appearance: Normal appearance. She is not ill-appearing, toxic-appearing or diaphoretic.  HENT:     Head: Normocephalic and atraumatic.     Right Ear: Tympanic membrane, ear canal and external ear normal. There is no impacted cerumen.     Left Ear: Tympanic membrane, ear canal and external ear normal. There is no impacted cerumen.     Nose: No congestion or rhinorrhea.     Mouth/Throat:     Mouth: Mucous membranes are moist.     Pharynx: Oropharynx is clear. No oropharyngeal exudate or posterior oropharyngeal erythema.  Eyes:     General: No scleral icterus.       Right eye: No discharge.        Left eye: No discharge.     Extraocular Movements: Extraocular movements intact.     Conjunctiva/sclera: Conjunctivae normal.     Pupils: Pupils are equal, round, and reactive to light.  Cardiovascular:     Rate and Rhythm: Normal rate and regular rhythm.     Pulses: Normal pulses.     Heart sounds: Murmur (2/6) heard.     No friction rub. No gallop.  Pulmonary:     Effort: Pulmonary effort is normal. No respiratory distress.  Breath sounds: Normal breath sounds. No stridor. No wheezing, rhonchi or rales.  Chest:     Chest wall: No tenderness.  Abdominal:     General: Abdomen is flat. Bowel sounds are normal. There is no distension.     Palpations: Abdomen is soft. There is no mass.     Tenderness: There is no abdominal tenderness. There is no right CVA tenderness, left CVA tenderness, guarding or rebound.     Hernia: No hernia is present.  Musculoskeletal:        General: No swelling, tenderness or deformity. Normal range  of motion.     Cervical back: Normal range of motion and neck supple. No rigidity or tenderness.     Right lower leg: No edema.     Left lower leg: No edema.  Lymphadenopathy:     Cervical: No cervical adenopathy.  Skin:    General: Skin is warm and dry.     Coloration: Skin is not jaundiced or pale.     Findings: No bruising, erythema, lesion or rash.  Neurological:     General: No focal deficit present.     Mental Status: She is alert and oriented to person, place, and time. Mental status is at baseline.     Cranial Nerves: No cranial nerve deficit.     Sensory: No sensory deficit.     Motor: No weakness.     Coordination: Coordination normal.     Gait: Gait normal.     Deep Tendon Reflexes: Reflexes normal.  Psychiatric:        Mood and Affect: Mood normal.        Behavior: Behavior normal.        Thought Content: Thought content normal.        Judgment: Judgment normal.    No results found.  Assessment/plan: Alexandria Weber is a 58 y.o. female present for CPE and combined chronic condition management appointment Essential hypertension/HLD/mitral valve stenosis/statin myopathy Stable Continue amlodipine  5 mg daily Continue to work on higher fiber diet and routine exercise.  Patient elected to discontinue Lipitor 10 mg every other day secondary to continued myalgias Labs collected today  Anxiety Stable Eye pressures are up> poss related to effexor > decrease dose at that time > ophthalmologist> elevated pressures not related to effexor  > diagnosed glaucoma and under treatment Continue effexor  150 mg qd.   Breast cancer screening by mammogram - MM 3D SCREENING MAMMOGRAM BILATERAL BREAST; Future Influenza vaccine needed - Flu vaccine trivalent PF, 6mos and older(Flulaval,Afluria,Fluarix,Fluzone) Need for vaccination for pneumococcus - Pneumococcal conjugate vaccine 20-valent Need for hepatitis B vaccination Declined  Vitamin D  deficiency - Vitamin D  (25  hydroxy) Elevated alkaline phosphatase level -Gallstones present-patient elected to monitor - Vitamin D  (25 hydroxy) Diabetes mellitus screening - Hemolobin A1c Aortic valve stenosis/Mitral valve stenosis, unspecified etiology Murmur sounded more prominent today.  She has not had cardiology follow-up for additional echo since 2013.  We do not have a copy of her prior echo-which was completed for annual follow-up. She is asymptomatic today. She is agreeable to move forward with echocardiogram for reevaluation. - ECHOCARDIOGRAM COMPLETE; Future   Routine general medical examination at a health care facility (Primary) Labs collected today Patient was encouraged to exercise greater than 150 minutes a week. Patient was encouraged to choose a diet filled with fresh fruits and vegetables, and lean meats. AVS provided to patient today for education/recommendation on gender specific health and safety maintenance. Colon cancer screening: cologuard completed 07/17/2022, repeat 3 years  Mammogram: completed: 06/21/2023,  No fhx.. Breast center [place]>>ordered Cervical cancer screening: last pap: 11/15/2022, results: WNL/negative HPV completed by: [facility] Elnita Bodily, NP Immunizations: tdap UTD 01/2016, Influenza-GIVEN, shingrix  series completed.  Hepatitis B-declined, Prevnar-given Infectious disease screening: HIV completed. hep C screen completed DEXA:  vit d def, estrogen def and hyperparathyroidism. > normal.  12/07/2020 Rpt 57yr  Return in about 25 weeks (around 12/12/2024) for Routine chronic condition follow-up.  Orders Placed This Encounter  Procedures   MM 3D SCREENING MAMMOGRAM BILATERAL BREAST   Flu vaccine trivalent PF, 6mos and older(Flulaval,Afluria,Fluarix,Fluzone)   Pneumococcal conjugate vaccine 20-valent   CBC   Comprehensive metabolic panel with GFR   Hemoglobin A1c   Lipid panel   TSH   Vitamin D  (25 hydroxy)   ECHOCARDIOGRAM COMPLETE     Meds ordered this encounter  Medications   amLODipine  (NORVASC ) 5 MG tablet    Sig: Take 1 tablet (5 mg total) by mouth daily.    Dispense:  90 tablet    Refill:  2   venlafaxine  XR (EFFEXOR -XR) 150 MG 24 hr capsule    Sig: Take 1 capsule (150 mg total) by mouth daily with breakfast.    Dispense:  90 capsule    Refill:  2   Referral Orders  No referral(s) requested today     Electronically signed by: Charlies Bellini, DO  [facility]

## 2024-06-23 ENCOUNTER — Ambulatory Visit: Payer: Self-pay | Admitting: Family Medicine

## 2024-06-23 LAB — HEMOGLOBIN A1C: Hgb A1c MFr Bld: 5.5 % (ref 4.6–6.5)

## 2024-06-23 MED ORDER — POTASSIUM CHLORIDE CRYS ER 20 MEQ PO TBCR: 20.0000 meq | EXTENDED_RELEASE_TABLET | Freq: Every day | ORAL | 0 refills | Status: DC

## 2024-06-27 ENCOUNTER — Ambulatory Visit
Admission: RE | Admit: 2024-06-27 | Discharge: 2024-06-27 | Disposition: A | Discharge status: Home / Self Care | Source: Ambulatory Visit | Attending: Family Medicine | Admitting: Family Medicine

## 2024-06-27 ENCOUNTER — Ambulatory Visit: Admitting: Neurology

## 2024-06-27 DIAGNOSIS — Z1231 Encounter for screening mammogram for malignant neoplasm of breast: Secondary | ICD-10-CM

## 2024-07-10 ENCOUNTER — Other Ambulatory Visit: Payer: Self-pay

## 2024-07-10 ENCOUNTER — Other Ambulatory Visit (HOSPITAL_COMMUNITY): Payer: Self-pay

## 2024-07-10 NOTE — Progress Notes (Signed)
 Specialty Pharmacy Refill Coordination Note  Alexandria Weber is a 58 y.o. female assessed today regarding refills of clinic administered specialty medication(s) IncobotulinumtoxinA  (Xeomin )   Clinic requested Courier to Provider Office   Delivery date: 07/15/24   Verified address: [facility] [address], [address] [place], [place] [address]   Medication will be filled on: 07/14/24

## 2024-07-14 ENCOUNTER — Other Ambulatory Visit: Payer: Self-pay

## 2024-07-18 ENCOUNTER — Ambulatory Visit: Admitting: Neurology

## 2024-07-18 DIAGNOSIS — G243 Spasmodic torticollis: Secondary | ICD-10-CM | POA: Diagnosis not present

## 2024-07-18 MED ORDER — INCOBOTULINUMTOXINA 100 UNITS IM SOLR
300.0000 [IU] | INTRAMUSCULAR | Status: AC
  Administered 2024-07-18: 260 [IU] via INTRAMUSCULAR

## 2024-07-18 NOTE — Procedures (Signed)
 [facility]   Procedure Note Botox   Attending: Dr. Asberry Elfa Wooton  Preoperative Diagnosis(es): Cervical Dystonia  Result History  Medication seems to be working  Consent obtained from: The patient Benefits discussed included, but were not limited to decreased muscle tightness, increased joint range of motion, and decreased pain.  Risk discussed included, but were not limited pain and discomfort, bleeding, bruising, excessive weakness, venous thrombosis, muscle atrophy and dysphagia.  A copy of the patient medication guide was given to the patient which explains the blackbox warning.  Patients identity and treatment sites confirmed Yes.  .  Details of Procedure: Skin was cleaned with alcohol.  A 30 gauge, 25mm  needle was introduced to the target muscle, except for posterior splenius where 27 gauge, 1.5 inch needle used.   Prior to injection, the needle plunger was aspirated to make sure the needle was not within a blood vessel.  There was no blood retrieved on aspiration.    Following is a summary of the muscles injected  And the amount of incobotulinumtoxin A used:   Dilution 0.9% preservative free saline mixed with 100 u incobotulinumtoxin A to make 10 U per 0.1cc  Injections  Location Left  Right Units Number of sites        Sternocleidomastoid 60  60 1  Splenius Capitus, posterior approach  100 100 1  Splenius Capitus, lateral approach  50 50 1  Levator Scapulae      Trapezius 20/20/10  50 3        TOTAL UNITS:   260     Agent: Botulinum Toxin type A  (Xeomin ).    3vials of Botox  were used, each containing 100 units and freshly diluted with 1 mL of sterile, non-preserved saline   Total injected (Units): 260  Total wasted (Units):40   Pt tolerated procedure well without complications.   Reinjection is anticipated in 4 months.

## 2024-07-22 ENCOUNTER — Encounter (HOSPITAL_BASED_OUTPATIENT_CLINIC_OR_DEPARTMENT_OTHER): Payer: Self-pay

## 2024-07-23 ENCOUNTER — Other Ambulatory Visit (INDEPENDENT_AMBULATORY_CARE_PROVIDER_SITE_OTHER)

## 2024-07-23 DIAGNOSIS — I350 Nonrheumatic aortic (valve) stenosis: Secondary | ICD-10-CM

## 2024-07-23 LAB — ECHOCARDIOGRAM COMPLETE
AR max vel: 0.98 cm2
AV Area VTI: 1 cm2
AV Area mean vel: 0.95 cm2
AV Mean grad: 16 mmHg
AV Peak grad: 28.1 mmHg
AV Vena cont: 0.24 cm
Ao pk vel: 2.65 m/s
Area-P 1/2: 4.1 cm2
P 1/2 time: 389 ms
S' Lateral: 3.12 cm

## 2024-07-24 DIAGNOSIS — I340 Nonrheumatic mitral (valve) insufficiency: Secondary | ICD-10-CM | POA: Insufficient documentation

## 2024-07-24 DIAGNOSIS — I351 Nonrheumatic aortic (valve) insufficiency: Secondary | ICD-10-CM | POA: Insufficient documentation

## 2024-07-24 DIAGNOSIS — I5189 Other ill-defined heart diseases: Secondary | ICD-10-CM | POA: Insufficient documentation

## 2024-07-24 DIAGNOSIS — I517 Cardiomegaly: Secondary | ICD-10-CM | POA: Insufficient documentation

## 2024-08-04 ENCOUNTER — Other Ambulatory Visit: Payer: Self-pay

## 2024-08-04 ENCOUNTER — Encounter: Payer: Self-pay | Admitting: Family Medicine

## 2024-08-04 ENCOUNTER — Ambulatory Visit: Admitting: Family Medicine

## 2024-08-04 VITALS — BP 118/72 | HR 75 | Temp 97.7°F | Wt 150.0 lb

## 2024-08-04 DIAGNOSIS — T466X5A Adverse effect of antihyperlipidemic and antiarteriosclerotic drugs, initial encounter: Secondary | ICD-10-CM | POA: Diagnosis not present

## 2024-08-04 DIAGNOSIS — I10 Essential (primary) hypertension: Secondary | ICD-10-CM | POA: Diagnosis not present

## 2024-08-04 DIAGNOSIS — I340 Nonrheumatic mitral (valve) insufficiency: Secondary | ICD-10-CM | POA: Diagnosis not present

## 2024-08-04 DIAGNOSIS — I517 Cardiomegaly: Secondary | ICD-10-CM

## 2024-08-04 DIAGNOSIS — G720 Drug-induced myopathy: Secondary | ICD-10-CM | POA: Diagnosis not present

## 2024-08-04 DIAGNOSIS — I351 Nonrheumatic aortic (valve) insufficiency: Secondary | ICD-10-CM

## 2024-08-04 MED ORDER — TELMISARTAN-AMLODIPINE 80-5 MG PO TABS: 0.5000 | ORAL_TABLET | Freq: Every day | ORAL | 1 refills | Status: DC

## 2024-08-04 NOTE — Patient Instructions (Addendum)

## 2024-08-04 NOTE — Progress Notes (Signed)
 "   Patient ID: Alexandria Weber, female  DOB: 02/18/1966, 58 y.o.   MRN: [id number] Patient Care Team    Relationship Specialty Notifications Start End  Catherine Charlies LABOR, DO PCP - General Family Medicine  07/27/15   Tat, Asberry RAMAN, DO Consulting Physician Neurology  07/19/21   [facility] Physician Gynecology  02/05/23    Comment: Alexandria Chrzasnowski, NP    Chief Complaint  Patient presents with   Result Review    New Murmur F/U.     Subjective:  Alexandria Weber is a 59 y.o.  Female  present for new murmur follow-up All past medical history, surgical history, allergies, family history, immunizations, medications and social history were updated in the electronic medical record today. All recent labs, ED visits and hospitalizations within the last year were reviewed.   Mitral valve insufficiency, unspecified etiology>mild Mild concentric left ventricular hypertrophy (LVH)-Mild Nonrheumatic aortic valve insufficiency>mild-moderate Pt reports compliance  with amlodipine  5 mg every day. Patient denies chest pain, shortness of breath, orthopnea, dizziness or lower extremity edema.  She does have occasional palpitations which has been chronic. She is here today to review her echocardiogram and discuss management  Echocardiogram 07/23/2024 IMPRESSIONS   1. Left ventricular ejection fraction, by estimation, is 50 to 55%. Left  ventricular ejection fraction by 3D volume is 53 %. The left ventricle has  low normal function. The left ventricle has no regional wall motion  abnormalities. There is mild  concentric left ventricular hypertrophy. Left ventricular diastolic  parameters are consistent with Grade I diastolic dysfunction (impaired  relaxation). The average left ventricular global longitudinal strain is  -15.7 %.   2. Right ventricular systolic function is normal. The right ventricular  size is normal. Tricuspid regurgitation signal is  inadequate for assessing  PA pressure.   3. The mitral valve is normal in structure. Mild mitral valve  regurgitation. No evidence of mitral stenosis.   4. The aortic valve is normal in structure. Aortic valve regurgitation is  mild to moderate. No aortic stenosis is present.   5. The inferior vena cava is normal in size with greater than 50%  respiratory variability, suggesting right atrial pressure of 3 mmHg.      06/20/2024    8:00 AM 12/07/2023    8:23 AM 11/14/2022   10:18 AM 05/30/2022    9:51 AM 10/10/2021   10:28 AM  Depression screen PHQ 2/9  Decreased Interest 0 0 0 0 0  Down, Depressed, Hopeless 0 0 0 0 0  PHQ - 2 Score 0 0 0 0 0  Altered sleeping 0 0 0 1   Tired, decreased energy 0 0 0 0   Change in appetite 0 0 0 0   Feeling bad or failure about yourself  0 0 0 0   Trouble concentrating 0 0 0 0   Moving slowly or fidgety/restless 0 0 0 0   Suicidal thoughts 0 0 0 0   PHQ-9 Score 0 0  0  1    Difficult doing work/chores Not difficult at all Not difficult at all        Data saved with a previous flowsheet row definition      12/07/2023    8:23 AM 05/30/2022    9:51 AM 02/10/2021    9:35 AM 06/24/2019    8:10 AM  GAD 7 : Generalized Anxiety Score  Nervous, Anxious, on Edge 0 0 0 0  Control/stop worrying 0 0 0  0  Worry too much - different things 0 0 0 0  Trouble relaxing 0 0 0 1  Restless 0 0 0 0  Easily annoyed or irritable 0 0 0 0  Afraid - awful might happen 0 0 0 0  Total GAD 7 Score 0 0 0 1  Anxiety Difficulty Not difficult at all   Not difficult at all      09/19/2021    8:51 AM 01/24/2022   12:57 PM 11/14/2022   10:15 AM 12/07/2023    8:22 AM 06/20/2024    8:00 AM  Fall Risk  Falls in the past year? 0 0 0 0 0  Was there an injury with Fall? 0  0  0  0  0   Fall Risk Category Calculator 0 0 0 0 0  Fall Risk Category (Retired) Low  Low      (RETIRED) Patient Fall Risk Level  Low fall risk      Patient at Risk for Falls Due to     No Fall Risks  Fall risk  Follow up   Falls evaluation completed  Falls evaluation completed     Data saved with a previous flowsheet row definition    Immunization History  Administered Date(s) Administered   Influenza Inj Mdck Quad Pf 08/26/2018   Influenza, Quadrivalent, Recombinant, Inj, Pf 05/18/2019   Influenza, Seasonal, Injecte, Preservative Fre 05/16/2023, 06/20/2024   Influenza,inj,Quad PF,6+ Mos 07/27/2015, 09/05/2016, 06/28/2020, 06/02/2021, 05/30/2022   Influenza-Unspecified 06/17/2013, 06/17/2014, 06/05/2017, 06/28/2020   PFIZER(Purple Top)SARS-COV-2 Vaccination 10/27/2019, 11/24/2019, 08/20/2020   PNEUMOCOCCAL CONJUGATE-20 06/20/2024   Pfizer Covid-19 Vaccine Bivalent Booster 19yrs & up 05/31/2021   Tdap 01/25/2016   Zoster Recombinant(Shingrix ) 06/12/2018, 10/10/2018     Past Medical History:  Diagnosis Date   Anxiety    Depression    Elevated bilirubin 07/14/2021   Elevated CK 11/15/2021   Elevated LFTs 10/10/2021   Fibroadenoma of breast    H/O IN LEFT BREAST NO CHANGE PER [place]   Gallstones 2021   Heart murmur    mild mitral stenosis and aortic stenosis by prior PCP note. Saw Dr. gerard in 2013-2015.unable to view echo or records surrounding condition.    High cholesterol    Hyperparathyroidism 10/30/2019   Hypertension    LBBB (left bundle branch block) 2013   saw Dr. Gerard - stable   Migraine headache    Mitral valve stenosis 03/20/2014   [facility] 2015> 2025 echo> NO evidence of MV stenosis   Muscle weakness 10/10/2021   PMS (premenstrual syndrome)    Positive ANA (antinuclear antibody) 11/15/2021   Torticollis, acquired 07/27/2015   [facility] orthopedics providing Botox  injections, Dr. Bonner     Allergies  Allergen Reactions   Penicillins Hives   Sulfa Antibiotics Hives   Lipitor [Atorvastatin ] Rash   Zoloft [Sertraline Hcl]    Past Surgical History:  Procedure Laterality Date   ADENOIDECTOMY     CESAREAN SECTION     x2   Family History  Problem Relation Age  of Onset   Heart disease Mother    Hearing loss Mother    Diabetes Mother    Heart disease Father    Hypertension Father    Stroke Father    Arthritis Father    Drug abuse Brother        Commited suicide   Diabetes Mellitus II Maternal Aunt    Diabetes Mellitus II Paternal Aunt    Heart disease Paternal Uncle    Hypertension Maternal  Grandfather    Hypertension Paternal Grandfather    Heart disease Paternal Grandfather    Colon cancer Neg Hx    Breast cancer Neg Hx    Hyperparathyroidism Neg Hx    Social History   Social History Narrative   Married. Spouse is name Odis. 3 children.   High school graduate. Full-time employed. Location Manager at a bank.   Drinks caffeinated beverages, no tobacco, no alcohol, no recreational drugs.   Wears her seatbelt. Smoke detectors located in the home. Firearms located in the home in a locked case.   Feels safe in her relationship.      Right Handed    Lives in a one story home    Allergies as of 08/04/2024       Reactions   Penicillins Hives   Sulfa Antibiotics Hives   Lipitor [atorvastatin ] Rash   Zoloft [sertraline Hcl]         Medication List        Accurate as of August 04, 2024  9:34 AM. If you have any questions, ask your nurse or doctor.          STOP taking these medications    amLODipine  5 MG tablet Commonly known as: NORVASC  Stopped by: Charlies Bellini, DO       TAKE these medications    latanoprost 0.005 % ophthalmic solution Commonly known as: XALATAN 1 drop at bedtime.   multivitamin capsule Take 1 capsule by mouth daily.   potassium chloride  SA 20 MEQ tablet Commonly known as: KLOR-CON  M Take 1 tablet (20 mEq total) by mouth daily.   Telmisartan-amLODIPine  80-5 MG Tabs Take 0.5 tablets by mouth daily. Started by: Charlies Bellini, DO   venlafaxine  XR 150 MG 24 hr capsule Commonly known as: EFFEXOR -XR Take 1 capsule (150 mg total) by mouth daily with breakfast.   Xeomin  100 units Solr  injection Generic drug: incobotulinumtoxinA  Inject 300 units into neck and head every 90 days by the MD in the MD office        All past medical history, surgical history, allergies, family history, immunizations andmedications were updated in the EMR today and reviewed under the history and medication portions of their EMR.       Review of Systems  Constitutional: Negative.   HENT: Negative.    Eyes: Negative.   Respiratory: Negative.    Cardiovascular: Negative.   Gastrointestinal: Negative.   Genitourinary: Negative.   Musculoskeletal: Negative.   Skin: Negative.   Neurological: Negative.   Endo/Heme/Allergies: Negative.   Psychiatric/Behavioral: Negative.    All other systems reviewed and are negative.  14 pt review of systems performed and negative (unless mentioned in an HPI)  Objective: BP 118/72   Pulse 75   Temp 97.7 F (36.5 C)   Wt 150 lb (68 kg)   LMP 09/07/2017   SpO2 97%   BMI 24.58 kg/m  Physical Exam Vitals and nursing note reviewed.  Constitutional:      General: She is not in acute distress.    Appearance: Normal appearance. She is not ill-appearing, toxic-appearing or diaphoretic.  HENT:     Head: Normocephalic and atraumatic.  Eyes:     General: No scleral icterus.       Right eye: No discharge.        Left eye: No discharge.     Extraocular Movements: Extraocular movements intact.     Conjunctiva/sclera: Conjunctivae normal.     Pupils: Pupils are equal, round, and reactive  to light.  Cardiovascular:     Rate and Rhythm: Normal rate and regular rhythm.     Heart sounds: Murmur heard.  Pulmonary:     Effort: Pulmonary effort is normal. No respiratory distress.     Breath sounds: Normal breath sounds. No wheezing, rhonchi or rales.  Musculoskeletal:     Right lower leg: No edema.     Left lower leg: No edema.  Skin:    General: Skin is warm.     Findings: No rash.  Neurological:     Mental Status: She is alert and oriented to  person, place, and time. Mental status is at baseline.     Motor: No weakness.     Gait: Gait normal.  Psychiatric:        Mood and Affect: Mood normal.        Behavior: Behavior normal.        Thought Content: Thought content normal.        Judgment: Judgment normal.    No results found.  Assessment/plan: Carynn Felling is a 58 y.o. female present for problem-based visit Essential hypertension/HLD/mitral valve stenosis/statin myopathy stable Dc Amlodipine  5 mg daily Start telmisartan- amlodipine  - 80-5 mg (1/2 tab daily) Continue to work on higher fiber diet and routine exercise.  Patient elected to discontinue Lipitor 10 mg every other day secondary to continued myalgias 1 mo htn f/u   AV regurgitation mild-moderate/MV regurgitation-mild/mild concentric LVH She is asymptomatic today. Echocardiogram 07/23/2024:see above Today we discussed results in detail of her echocardiogram.  By echocardiogram patient does not have mitral stenosis which is reported in 2015. Elected to change her hypertension management to give her added benefits of ACE/ARB for her LVH findings.    Return in about 4 weeks (around 09/01/2024).  Orders Placed This Encounter  Procedures   Ambulatory referral to Cardiology    Meds ordered this encounter  Medications   Telmisartan-amLODIPine  80-5 MG TABS    Sig: Take 0.5 tablets by mouth daily.    Dispense:  45 tablet    Refill:  1   Referral Orders         Ambulatory referral to Cardiology       Electronically signed by: Charlies Bellini, DO [facility] "

## 2024-08-07 NOTE — Progress Notes (Signed)
 "     Cardiology Office Note Date:  08/11/2024  ID:  Alexandria Weber, DOB 1966-01-16, MRN [id number] PCP:  Catherine Charlies LABOR, DO  Cardiologist:  Joelle VEAR Ren Donley, MD  Chief Complaint  Patient presents with   aortic valve regurgitation     Problems Mild-Mod AR, Mild MR TTE 12/25: 50-55%, mild LVH, mild-mod AI, Mild MR Statin myopathy- discontinued AN10 every other day 12/5 LDL 144 11/25 M: TN-AE 40-2.5,   Visits  1/26: CAC, TTE in 1 year, LP, 1 year with SW    Discussed the use of AI scribe software for clinical note transcription with the patient, who gave verbal consent to proceed.  History of Present Illness   Alexandria Weber is a 59 year old female who presents with a heart murmur. She was referred by her primary care physician for management of her heart murmur. Her medication regimen was recently adjusted to telmisartan  and amlodipine . She has no prior heart disease, stents, or myocardial infarction and no chest pain or dyspnea with activity. She has joint pain with statin use and cannot tolerate statins. She is not on lipid-lowering therapy. Her mother has atrial fibrillation and her father died from stroke. She has been exposed to secondhand smoke but has never smoked.        ROS: Please see the history of present illness. All other systems are reviewed and negative.   Past Medical History:  Diagnosis Date   Anxiety    Depression    Elevated bilirubin 07/14/2021   Elevated CK 11/15/2021   Elevated LFTs 10/10/2021   Fibroadenoma of breast    H/O IN LEFT BREAST NO CHANGE PER [place]   Gallstones 2021   Heart murmur    mild mitral stenosis and aortic stenosis by prior PCP note. Saw Dr. gerard in 2013-2015.unable to view echo or records surrounding condition.    High cholesterol    Hyperparathyroidism 10/30/2019   Hypertension    LBBB (left bundle branch block) 2013   saw Dr. Gerard - stable   Migraine headache    Mitral valve stenosis 03/20/2014   [facility]  2015> 2025 echo> NO evidence of MV stenosis   Muscle weakness 10/10/2021   PMS (premenstrual syndrome)    Positive ANA (antinuclear antibody) 11/15/2021   Torticollis, acquired 07/27/2015   [place] [facility] providing Botox  injections, Dr. Bonner      Past Surgical History:  Procedure Laterality Date   ADENOIDECTOMY     CESAREAN SECTION     x2    Current Outpatient Medications  Medication Sig Dispense Refill   incobotulinumtoxinA  (XEOMIN ) 100 units SOLR injection Inject 300 units into neck and head every 90 days by the MD in the MD office 3 each 2   latanoprost (XALATAN) 0.005 % ophthalmic solution 1 drop at bedtime.     Multiple Vitamin (MULTIVITAMIN) capsule Take 1 capsule by mouth daily.     potassium chloride  SA (KLOR-CON  M) 20 MEQ tablet Take 1 tablet (20 mEq total) by mouth daily. 7 tablet 0   Telmisartan -amLODIPine  80-5 MG TABS Take 0.5 tablets by mouth daily. 45 tablet 1   venlafaxine  XR (EFFEXOR -XR) 150 MG 24 hr capsule Take 1 capsule (150 mg total) by mouth daily with breakfast. 90 capsule 2   Current Facility-Administered Medications  Medication Dose Route Frequency Provider Last Rate Last Admin   incobotulinumtoxinA  (XEOMIN ) 100 units injection 300 Units  300 Units Intramuscular Q90 days Tat, Asberry RAMAN, DO   260 Units at 07/18/24 1507  Allergies:   Penicillins, Sulfa antibiotics, Lipitor [atorvastatin ], and Zoloft [sertraline hcl]   Social History:  see above  Family History:  see above  PHYSICAL EXAM: VS:  BP 130/68 (BP Location: Right Arm, Patient Position: Sitting, Cuff Size: Normal)   Pulse 73   Ht 5' 5 (1.651 m)   Wt 152 lb 12.8 oz (69.3 kg)   LMP 09/07/2017   SpO2 97%   BMI 25.43 kg/m  , BMI Body mass index is 25.43 kg/m. GEN: Well nourished, well developed, in no acute distress HEENT: normal Neck: no JVD, carotid bruits, or masses Cardiac: RRR 2/6 diastolic murmur loudest in LUSB; no murmurs, rubs, or gallops,no edema  Respiratory:  CTAB  bilaterally, normal work of breathing GI: soft, nontender, nondistended, + BS Extremities: No LE edema Skin: warm and dry, no rash Neuro:  Strength and sensation are intact  EKG: LBBB  Recent Labs: Reviewed  Studies: Reviewed  ASSESSMENT AND PLAN: [name] is a 59 y.o. female who presents for new visit.     Nonrheumatic aortic valve insufficiency Asymptomatic with mild to moderate insufficiency. Prominent murmur noted. - Repeat echocardiogram in one year to monitor valve function.  Mixed hyperlipidemia and statin myopathy Elevated cholesterol with statin intolerance. Discussed calcium  score for coronary artery disease risk assessment. - Order calcium  score. - If low, manage with diet and exercise, monitor cholesterol. - If high, initiate nonstatin medication.  Primary hypertension Managed with telmisartan  and amlodipine .       Signed, Joelle VEAR Ren Donley, MD  08/11/2024 9:17 AM     [facility] "

## 2024-08-11 ENCOUNTER — Ambulatory Visit

## 2024-08-11 VITALS — BP 130/68 | HR 73 | Ht 65.0 in | Wt 152.8 lb

## 2024-08-11 DIAGNOSIS — I10 Essential (primary) hypertension: Secondary | ICD-10-CM | POA: Diagnosis not present

## 2024-08-11 DIAGNOSIS — T466X5D Adverse effect of antihyperlipidemic and antiarteriosclerotic drugs, subsequent encounter: Secondary | ICD-10-CM | POA: Diagnosis not present

## 2024-08-11 DIAGNOSIS — I351 Nonrheumatic aortic (valve) insufficiency: Secondary | ICD-10-CM

## 2024-08-11 DIAGNOSIS — G720 Drug-induced myopathy: Secondary | ICD-10-CM

## 2024-08-11 DIAGNOSIS — E782 Mixed hyperlipidemia: Secondary | ICD-10-CM | POA: Diagnosis not present

## 2024-08-11 DIAGNOSIS — T466X5A Adverse effect of antihyperlipidemic and antiarteriosclerotic drugs, initial encounter: Secondary | ICD-10-CM

## 2024-08-11 NOTE — Patient Instructions (Signed)
 Medication Instructions:  Your physician recommends that you continue on your current medications as directed. Please refer to the Current Medication list given to you today.  *If you need a refill on your cardiac medications before your next appointment, please call your pharmacy*  Lab Work: Lipid If you have labs (blood work) drawn today and your tests are completely normal, you will receive your results only by: MyChart Message (if you have MyChart) OR A paper copy in the mail If you have any lab test that is abnormal or we need to change your treatment, we will call you to review the results.  Testing/Procedures: Dr. Ren has ordered a CT coronary calcium  score.   Test locations:  [facility] at [facility] [place]  [place] [facility] at [facility]  This is $99 out of pocket.   Coronary CalciumScan A coronary calcium  scan is an imaging test used to look for deposits of calcium  and other fatty materials (plaques) in the inner lining of the blood vessels of the heart (coronary arteries). These deposits of calcium  and plaques can partly clog and narrow the coronary arteries without producing any symptoms or warning signs. This puts a person at risk for a heart attack. This test can detect these deposits before symptoms develop. Tell a health care provider about: Any allergies you have. All medicines you are taking, including vitamins, herbs, eye drops, creams, and over-the-counter medicines. Any problems you or family members have had with anesthetic medicines. Any blood disorders you have. Any surgeries you have had. Any medical conditions you have. Whether you are pregnant or may be pregnant. What are the risks? Generally, this is a safe procedure. However, problems may occur, including: Harm to a pregnant woman and her unborn baby. This test involves the use of radiation. Radiation  exposure can be dangerous to a pregnant woman and her unborn baby. If you are pregnant, you generally should not have this procedure done. Slight increase in the risk of cancer. This is because of the radiation involved in the test. What happens before the procedure? No preparation is needed for this procedure. What happens during the procedure? You will undress and remove any jewelry around your neck or chest. You will put on a hospital gown. Sticky electrodes will be placed on your chest. The electrodes will be connected to an electrocardiogram (ECG) machine to record a tracing of the electrical activity of your heart. A CT scanner will take pictures of your heart. During this time, you will be asked to lie still and hold your breath for 2-3 seconds while a picture of your heart is being taken. The procedure may vary among health care providers and hospitals. What happens after the procedure? You can get dressed. You can return to your normal activities. It is up to you to get the results of your test. Ask your health care provider, or the department that is doing the test, when your results will be ready. Summary A coronary calcium  scan is an imaging test used to look for deposits of calcium  and other fatty materials (plaques) in the inner lining of the blood vessels of the heart (coronary arteries). Generally, this is a safe procedure. Tell your health care provider if you are pregnant or may be pregnant. No preparation is needed for this procedure. A CT scanner will take pictures of your heart. You can return to your normal activities after the scan is done. This  information is not intended to replace advice given to you by your health care provider. Make sure you discuss any questions you have with your health care provider. Document Released: 01/20/2008 Document Revised: 06/12/2016 Document Reviewed: 06/12/2016 Elsevier Interactive Patient Education  2017 [employer].  Your physician  has requested that you have an echocardiogram in 1 year. Echocardiography is a painless test that uses sound waves to create images of your heart. It provides your doctor with information about the size and shape of your heart and how well your hearts chambers and valves are working. This procedure takes approximately one hour. There are no restrictions for this procedure. Please do NOT wear cologne, perfume, aftershave, or lotions (deodorant is allowed). Please arrive 15 minutes prior to your appointment time.  Please note: We ask at that you not bring children with you during ultrasound (echo/ vascular) testing. Due to room size and safety concerns, children are not allowed in the ultrasound rooms during exams. Our front office staff cannot provide observation of children in our lobby area while testing is being conducted. An adult accompanying a patient to their appointment will only be allowed in the ultrasound room at the discretion of the ultrasound technician under special circumstances. We apologize for any inconvenience.   Follow-Up: At [facility], you and your health needs are our priority.  As part of our continuing mission to provide you with exceptional heart care, our providers are all part of one team.  This team includes your primary Cardiologist (physician) and Advanced Practice Providers or APPs (Physician Assistants and Nurse Practitioners) who all work together to provide you with the care you need, when you need it.  Your next appointment:   1 year(s)  Provider:   One of our Advanced Practice Providers (APPs): Morse Clause, PA-C  Lamarr Satterfield, NP Miriam Shams, NP  Olivia Pavy, PA-C Josefa Beauvais, NP  Leontine Salen, PA-C Orren Fabry, PA-C  [place], PA-C Ernest Dick, NP  Damien Braver, NP Jon Hails, PA-C  Waddell Donath, PA-C    Dayna Dunn, PA-C  Scott Weaver, PA-C Lum Louis, NP Katlyn West, NP Callie Goodrich, PA-C  Xika Zhao, NP Sheng Haley,  PA-C    Kathleen Johnson, PA-C

## 2024-08-12 ENCOUNTER — Ambulatory Visit: Payer: Self-pay

## 2024-08-12 DIAGNOSIS — G720 Drug-induced myopathy: Secondary | ICD-10-CM

## 2024-08-12 LAB — LIPID PANEL
Chol/HDL Ratio: 3.6 ratio (ref 0.0–4.4)
Cholesterol, Total: 249 mg/dL — ABNORMAL HIGH (ref 100–199)
HDL: 69 mg/dL
LDL Chol Calc (NIH): 160 mg/dL — ABNORMAL HIGH (ref 0–99)
Triglycerides: 113 mg/dL (ref 0–149)
VLDL Cholesterol Cal: 20 mg/dL (ref 5–40)

## 2024-08-25 ENCOUNTER — Ambulatory Visit (HOSPITAL_COMMUNITY)
Admission: RE | Admit: 2024-08-25 | Discharge: 2024-08-25 | Disposition: A | Discharge status: Home / Self Care | Payer: Self-pay | Source: Ambulatory Visit | Attending: Internal Medicine | Admitting: Internal Medicine

## 2024-08-25 DIAGNOSIS — E782 Mixed hyperlipidemia: Secondary | ICD-10-CM | POA: Insufficient documentation

## 2024-08-27 MED ORDER — EZETIMIBE 10 MG PO TABS: 10.0000 mg | ORAL_TABLET | Freq: Every day | ORAL | 3 refills | Status: AC

## 2024-08-27 NOTE — Telephone Encounter (Signed)
"   I called the patient to discuss the results of her calcium  score.  I explained that given her calcium  score of 10 and being in the 80 percentile, we will aim for LDL goal less than 100 due to ASCVD risk around 3%.  I told patient that I will start ezetimibe  10 mg and refer to lipid clinic given statin intolerance and likely need for more aggressive lipid-lowering therapy given LDL at 160.  She will also need LPA and genetic testing given early ASCVD.  Joelle DEL. Ren Ny, MD [facility]  "

## 2024-08-28 ENCOUNTER — Other Ambulatory Visit: Payer: Self-pay | Admitting: Medical Genetics

## 2024-09-03 ENCOUNTER — Encounter: Payer: Self-pay | Admitting: Family Medicine

## 2024-09-03 ENCOUNTER — Ambulatory Visit: Payer: Self-pay | Admitting: Family Medicine

## 2024-09-03 ENCOUNTER — Ambulatory Visit: Admitting: Family Medicine

## 2024-09-03 VITALS — BP 124/72 | HR 74 | Temp 98.0°F | Wt 155.2 lb

## 2024-09-03 DIAGNOSIS — I340 Nonrheumatic mitral (valve) insufficiency: Secondary | ICD-10-CM

## 2024-09-03 DIAGNOSIS — T466X5A Adverse effect of antihyperlipidemic and antiarteriosclerotic drugs, initial encounter: Secondary | ICD-10-CM | POA: Diagnosis not present

## 2024-09-03 DIAGNOSIS — I351 Nonrheumatic aortic (valve) insufficiency: Secondary | ICD-10-CM | POA: Diagnosis not present

## 2024-09-03 DIAGNOSIS — I517 Cardiomegaly: Secondary | ICD-10-CM | POA: Diagnosis not present

## 2024-09-03 DIAGNOSIS — E782 Mixed hyperlipidemia: Secondary | ICD-10-CM

## 2024-09-03 DIAGNOSIS — I10 Essential (primary) hypertension: Secondary | ICD-10-CM | POA: Diagnosis not present

## 2024-09-03 DIAGNOSIS — I5189 Other ill-defined heart diseases: Secondary | ICD-10-CM

## 2024-09-03 DIAGNOSIS — G720 Drug-induced myopathy: Secondary | ICD-10-CM

## 2024-09-03 DIAGNOSIS — R931 Abnormal findings on diagnostic imaging of heart and coronary circulation: Secondary | ICD-10-CM | POA: Diagnosis not present

## 2024-09-03 LAB — BASIC METABOLIC PANEL WITH GFR
BUN: 17 mg/dL (ref 6–23)
CO2: 33 meq/L — ABNORMAL HIGH (ref 19–32)
Calcium: 9.2 mg/dL (ref 8.4–10.5)
Chloride: 100 meq/L (ref 96–112)
Creatinine, Ser: 0.75 mg/dL (ref 0.40–1.20)
GFR: 88.9 mL/min
Glucose, Bld: 73 mg/dL (ref 70–99)
Potassium: 3.8 meq/L (ref 3.5–5.1)
Sodium: 139 meq/L (ref 135–145)

## 2024-09-03 MED ORDER — TELMISARTAN-AMLODIPINE 80-5 MG PO TABS: 0.5000 | ORAL_TABLET | Freq: Every day | ORAL | 1 refills | Status: AC

## 2024-09-03 NOTE — Progress Notes (Signed)
 "   Patient ID: Alexandria Weber, female  DOB: 17-Feb-1966, 59 y.o.   MRN: [id number] Patient Care Team    Relationship Specialty Notifications Start End  Catherine Charlies LABOR, DO PCP - General Family Medicine  07/27/15   Tat, Asberry RAMAN, DO Consulting Physician Neurology  07/19/21   [facility] Physician Gynecology  02/05/23    Comment: Jami Chrzasnowski, NP    Chief Complaint  Patient presents with   Hypertension    Subjective:  Alexandria Weber is a 59 y.o.  Female  present for hypertension follow-up All past medical history, surgical history, allergies, family history, immunizations, medications and social history were updated in the electronic medical record today. All recent labs, ED visits and hospitalizations within the last year were reviewed.  Hypertension/hyperlipidemia/statin myopathy/mild LVH/grade 1 diastolic dysfunction/aortic regurgitation-moderate Pt reports compliance with telmisartan -amlodipine  80-5 mg, half a tablet daily.  Medication regimen was changed 4 weeks ago from amlodipine  5 mg daily, for better control of blood pressure and added ARB for cardiac benefits secondary to echo results with concentric LVH.  Patient reports compliance with Zetia  10 mg daily Patient denies chest pain, shortness of breath, dizziness or lower extremity edema.    Echocardiogram 07/23/2024 IMPRESSIONS   1. Left ventricular ejection fraction, by estimation, is 50 to 55%. Left  ventricular ejection fraction by 3D volume is 53 %. The left ventricle has  low normal function. The left ventricle has no regional wall motion  abnormalities. There is mild  concentric left ventricular hypertrophy. Left ventricular diastolic  parameters are consistent with Grade I diastolic dysfunction (impaired  relaxation). The average left ventricular global longitudinal strain is  -15.7 %.   2. Right ventricular systolic function is normal. The right ventricular  size  is normal. Tricuspid regurgitation signal is inadequate for assessing  PA pressure.   3. The mitral valve is normal in structure. Mild mitral valve  regurgitation. No evidence of mitral stenosis.   4. The aortic valve is normal in structure. Aortic valve regurgitation is  mild to moderate. No aortic stenosis is present.   5. The inferior vena cava is normal in size with greater than 50%  respiratory variability, suggesting right atrial pressure of 3 mmHg.      09/03/2024    8:56 AM 06/20/2024    8:00 AM 12/07/2023    8:23 AM 11/14/2022   10:18 AM 05/30/2022    9:51 AM  Depression screen PHQ 2/9  Decreased Interest 0 0 0 0 0  Down, Depressed, Hopeless 0 0 0 0 0  PHQ - 2 Score 0 0 0 0 0  Altered sleeping 0 0 0 0 1  Tired, decreased energy 0 0 0 0 0  Change in appetite 0 0 0 0 0  Feeling bad or failure about yourself  0 0 0 0 0  Trouble concentrating 0 0 0 0 0  Moving slowly or fidgety/restless 0 0 0 0 0  Suicidal thoughts 0 0 0 0 0  PHQ-9 Score 0 0 0  0  1   Difficult doing work/chores Not difficult at all Not difficult at all Not difficult at all       Data saved with a previous flowsheet row definition      09/03/2024    8:56 AM 12/07/2023    8:23 AM 05/30/2022    9:51 AM 02/10/2021    9:35 AM  GAD 7 : Generalized Anxiety Score  Nervous, Anxious, on Edge 0 0  0  0   Control/stop worrying 0 0  0  0   Worry too much - different things 0 0  0  0   Trouble relaxing 0 0  0  0   Restless 0 0  0  0   Easily annoyed or irritable 0 0  0  0   Afraid - awful might happen 0 0  0  0   Total GAD 7 Score 0 0 0 0  Anxiety Difficulty Not difficult at all Not difficult at all       Data saved with a previous flowsheet row definition      01/24/2022   12:57 PM 11/14/2022   10:15 AM 12/07/2023    8:22 AM 06/20/2024    8:00 AM 09/03/2024    8:56 AM  Fall Risk  Falls in the past year? 0 0 0 0 0  Was there an injury with Fall? 0  0  0  0  0  Fall Risk Category Calculator 0 0 0 0 0  Fall Risk  Category (Retired) Low       (RETIRED) Patient Fall Risk Level Low fall risk       Patient at Risk for Falls Due to    No Fall Risks No Fall Risks  Fall risk Follow up  Falls evaluation completed  Falls evaluation completed Falls evaluation completed     Data saved with a previous flowsheet row definition    Immunization History  Administered Date(s) Administered   Influenza Inj Mdck Quad Pf 08/26/2018   Influenza, Quadrivalent, Recombinant, Inj, Pf 05/18/2019   Influenza, Seasonal, Injecte, Preservative Fre 05/16/2023, 06/20/2024   Influenza,inj,Quad PF,6+ Mos 07/27/2015, 09/05/2016, 06/28/2020, 06/02/2021, 05/30/2022   Influenza-Unspecified 06/17/2013, 06/17/2014, 06/05/2017, 06/28/2020   PFIZER(Purple Top)SARS-COV-2 Vaccination 10/27/2019, 11/24/2019, 08/20/2020   PNEUMOCOCCAL CONJUGATE-20 06/20/2024   Pfizer Covid-19 Vaccine Bivalent Booster 34yrs & up 05/31/2021   Tdap 01/25/2016   Zoster Recombinant(Shingrix ) 06/12/2018, 10/10/2018     Past Medical History:  Diagnosis Date   Anxiety    Depression    Elevated bilirubin 07/14/2021   Elevated CK 11/15/2021   Elevated LFTs 10/10/2021   Fibroadenoma of breast    H/O IN LEFT BREAST NO CHANGE PER [place]   Gallstones 2021   Heart murmur    mild mitral stenosis and aortic stenosis by prior PCP note. Saw Dr. gerard in 2013-2015.unable to view echo or records surrounding condition.    High cholesterol    Hyperparathyroidism 10/30/2019   Hypertension    LBBB (left bundle branch block) 2013   saw Dr. Gerard - stable   Migraine headache    Mitral valve stenosis 03/20/2014   dx [facility] 2015> 2025 echo> NO evidence of MV stenosis   Muscle weakness 10/10/2021   PMS (premenstrual syndrome)    Positive ANA (antinuclear antibody) 11/15/2021   Torticollis, acquired 07/27/2015   [facility] orthopedics providing Botox  injections, Dr. Bonner     Allergies  Allergen Reactions   Penicillins Hives   Sulfa Antibiotics Hives   Lipitor  [Atorvastatin ] Rash   Zoloft [Sertraline Hcl]    Past Surgical History:  Procedure Laterality Date   ADENOIDECTOMY     CESAREAN SECTION     x2   Family History  Problem Relation Age of Onset   Heart disease Mother    Hearing loss Mother    Diabetes Mother    Heart disease Father    Hypertension Father    Stroke Father    Arthritis Father  Drug abuse Brother        Commited suicide   Diabetes Mellitus II Maternal Aunt    Diabetes Mellitus II Paternal Aunt    Heart disease Paternal Uncle    Hypertension Maternal Grandfather    Hypertension Paternal Grandfather    Heart disease Paternal Grandfather    Colon cancer Neg Hx    Breast cancer Neg Hx    Hyperparathyroidism Neg Hx    Social History   Social History Narrative   Married. Spouse is name Odis. 3 children.   High school graduate. Full-time employed. Location Manager at a bank.   Drinks caffeinated beverages, no tobacco, no alcohol, no recreational drugs.   Wears her seatbelt. Smoke detectors located in the home. Firearms located in the home in a locked case.   Feels safe in her relationship.      Right Handed    Lives in a one story home    Allergies as of 09/03/2024       Reactions   Penicillins Hives   Sulfa Antibiotics Hives   Lipitor [atorvastatin ] Rash   Zoloft [sertraline Hcl]         Medication List        Accurate as of September 03, 2024  9:11 AM. If you have any questions, ask your nurse or doctor.          STOP taking these medications    potassium chloride  SA 20 MEQ tablet Commonly known as: KLOR-CON  M Stopped by: Charlies Bellini, DO       TAKE these medications    ezetimibe  10 MG tablet Commonly known as: ZETIA  Take 1 tablet (10 mg total) by mouth daily.   latanoprost 0.005 % ophthalmic solution Commonly known as: XALATAN 1 drop at bedtime.   multivitamin capsule Take 1 capsule by mouth daily.   Telmisartan -amLODIPine  80-5 MG Tabs Take 0.5 tablets by mouth daily.    venlafaxine  XR 150 MG 24 hr capsule Commonly known as: EFFEXOR -XR Take 1 capsule (150 mg total) by mouth daily with breakfast.   Xeomin  100 units Solr injection Generic drug: incobotulinumtoxinA  Inject 300 units into neck and head every 90 days by the MD in the MD office        All past medical history, surgical history, allergies, family history, immunizations andmedications were updated in the EMR today and reviewed under the history and medication portions of their EMR.       Review of Systems  Constitutional: Negative.   HENT: Negative.    Eyes: Negative.   Respiratory: Negative.    Cardiovascular: Negative.   Gastrointestinal: Negative.   Genitourinary: Negative.   Musculoskeletal: Negative.   Skin: Negative.   Neurological: Negative.   Endo/Heme/Allergies: Negative.   Psychiatric/Behavioral: Negative.    All other systems reviewed and are negative.  14 pt review of systems performed and negative (unless mentioned in an HPI)  Objective: BP 124/72   Pulse 74   Temp 98 F (36.7 C)   Wt 155 lb 3.2 oz (70.4 kg)   LMP 09/07/2017   SpO2 97%   BMI 25.83 kg/m  Physical Exam Vitals and nursing note reviewed.  Constitutional:      General: She is not in acute distress.    Appearance: Normal appearance. She is not ill-appearing, toxic-appearing or diaphoretic.  HENT:     Head: Normocephalic and atraumatic.  Eyes:     General: No scleral icterus.       Right eye: No discharge.  Left eye: No discharge.     Extraocular Movements: Extraocular movements intact.     Conjunctiva/sclera: Conjunctivae normal.     Pupils: Pupils are equal, round, and reactive to light.  Cardiovascular:     Rate and Rhythm: Normal rate and regular rhythm.     Heart sounds: Murmur heard.  Pulmonary:     Effort: Pulmonary effort is normal. No respiratory distress.     Breath sounds: Normal breath sounds. No wheezing, rhonchi or rales.  Musculoskeletal:     Right lower leg: No  edema.     Left lower leg: No edema.  Skin:    General: Skin is warm.     Findings: No rash.  Neurological:     Mental Status: She is alert and oriented to person, place, and time. Mental status is at baseline.     Motor: No weakness.     Gait: Gait normal.  Psychiatric:        Mood and Affect: Mood normal.        Behavior: Behavior normal.        Thought Content: Thought content normal.        Judgment: Judgment normal.    No results found.  Assessment/plan: Alexandria Weber is a 59 y.o. female present for  Essential hypertension/HLD/statin myopathy/AV regurgitation mild-moderate/MV regurgitation-mild/mild concentric LVH/diastolic dysfunction/elevated coronary calcium  score stable Continue Telmisartan - amlodipine  - 80-5 mg (1/2 tab daily) Continue to work on higher fiber diet and routine exercise.  Statin myopathy > continue Zetia  10 mg daily (consider repatha after May labs if needed) CAC (08/25/2024): 10.4 (LAD only); 80th percentile She is asymptomatic  Echocardiogram 07/23/2024:see above> cardiology to repeat echocardiogram 07/2025 Added ARB for LVH/cardiac benefits. Patient is established with cardiology  Patient has next appointment scheduled for May 2026  Orders Placed This Encounter  Procedures   Basic Metabolic Panel (BMET)    Meds ordered this encounter  Medications   Telmisartan -amLODIPine  80-5 MG TABS    Sig: Take 0.5 tablets by mouth daily.    Dispense:  45 tablet    Refill:  1   Referral Orders  No referral(s) requested today      Electronically signed by: Charlies Bellini, DO [facility] "

## 2024-09-03 NOTE — Patient Instructions (Addendum)
         Great to see you today.  I have refilled the medication(s) we provide.   If labs were collected or images ordered, we will inform you of  results once we have received them and reviewed. We will contact you either by echart message, or telephone call.  Please give ample time to the testing facility, and our office to run,  receive and review results. Please do not call inquiring of results, even if you can see them in your chart. We will contact you as soon as we are able. If it has been over 1 week since the test was completed, and you have not yet heard from us , then please call us .    - echart message- for normal results that have been seen by the patient already.   - telephone call: abnormal results or if patient has not viewed results in their echart.  If a referral to a specialist was entered for you, please call us  in 2 weeks if you have not heard from the specialist office to schedule.

## 2024-09-15 ENCOUNTER — Other Ambulatory Visit

## 2024-11-14 ENCOUNTER — Ambulatory Visit: Admitting: Neurology

## 2024-12-12 ENCOUNTER — Ambulatory Visit: Admitting: Family Medicine

## 2025-08-11 ENCOUNTER — Ambulatory Visit (HOSPITAL_COMMUNITY)
